# Patient Record
Sex: Male | Born: 1955
Health system: Southern US, Community
[De-identification: ages and names within clinical notes are randomized; demographics above are authoritative.]

## PROBLEM LIST (undated history)

## (undated) DIAGNOSIS — G4730 Sleep apnea, unspecified: Secondary | ICD-10-CM

## (undated) DIAGNOSIS — G5600 Carpal tunnel syndrome, unspecified upper limb: Secondary | ICD-10-CM

## (undated) DIAGNOSIS — R7302 Impaired glucose tolerance (oral): Secondary | ICD-10-CM

## (undated) DIAGNOSIS — N529 Male erectile dysfunction, unspecified: Secondary | ICD-10-CM

## (undated) DIAGNOSIS — C8330 Diffuse large B-cell lymphoma, unspecified site: Secondary | ICD-10-CM

## (undated) DIAGNOSIS — F1090 Alcohol use, unspecified, uncomplicated: Secondary | ICD-10-CM

## (undated) DIAGNOSIS — G8929 Other chronic pain: Secondary | ICD-10-CM

## (undated) DIAGNOSIS — I10 Essential (primary) hypertension: Secondary | ICD-10-CM

## (undated) DIAGNOSIS — Z789 Other specified health status: Secondary | ICD-10-CM

## (undated) DIAGNOSIS — M549 Dorsalgia, unspecified: Secondary | ICD-10-CM

## (undated) DIAGNOSIS — F419 Anxiety disorder, unspecified: Secondary | ICD-10-CM

## (undated) HISTORY — DX: Essential (primary) hypertension: I10

## (undated) HISTORY — DX: Other chronic pain: G89.29

## (undated) HISTORY — DX: Carpal tunnel syndrome, unspecified upper limb: G56.00

## (undated) HISTORY — PX: KNEE SURGERY: SHX244

## (undated) HISTORY — PX: COLONOSCOPY W/ POLYPECTOMY: SHX1380

## (undated) HISTORY — PX: WISDOM TOOTH EXTRACTION: SHX21

## (undated) HISTORY — DX: Dorsalgia, unspecified: M54.9

## (undated) HISTORY — DX: Diffuse large B-cell lymphoma, unspecified site: C83.30

## (undated) HISTORY — PX: VASECTOMY: SHX75

## (undated) HISTORY — DX: Impaired glucose tolerance (oral): R73.02

## (undated) HISTORY — DX: Anxiety disorder, unspecified: F41.9

## (undated) HISTORY — DX: Male erectile dysfunction, unspecified: N52.9

---

## 2004-10-03 ENCOUNTER — Ambulatory Visit (HOSPITAL_COMMUNITY): Admission: RE | Admit: 2004-10-03 | Discharge: 2004-10-03 | Payer: Self-pay | Admitting: General Surgery

## 2004-10-03 ENCOUNTER — Encounter (INDEPENDENT_AMBULATORY_CARE_PROVIDER_SITE_OTHER): Payer: Self-pay | Admitting: General Surgery

## 2006-10-24 ENCOUNTER — Ambulatory Visit: Payer: Self-pay | Admitting: Internal Medicine

## 2006-11-28 ENCOUNTER — Ambulatory Visit (HOSPITAL_COMMUNITY): Admission: RE | Admit: 2006-11-28 | Discharge: 2006-11-28 | Payer: Self-pay | Admitting: Internal Medicine

## 2006-11-28 ENCOUNTER — Encounter: Payer: Self-pay | Admitting: Internal Medicine

## 2006-11-28 ENCOUNTER — Ambulatory Visit: Payer: Self-pay | Admitting: Internal Medicine

## 2010-06-20 NOTE — Op Note (Signed)
NAMEJEN, Jared Bentley                  ACCOUNT NO.:  [id number]   MEDICAL RECORD NO.:  [id number]          PATIENT TYPE:  AMB   LOCATION:  DAY                           FACILITY:  [facility]   PHYSICIAN:  R. Roetta Sessions, M.D. DATE OF BIRTH:  1955-08-25   DATE OF PROCEDURE:  11/28/2006  DATE OF DISCHARGE:                                PROCEDURE NOTE   PROCEDURE:  Diagnostic colonoscopy with biopsy.   ENDOSCOPIST:  Jonathon Bellows, M.D.   INDICATIONS FOR PROCEDURE:  A 55 year old Caucasian male with  intermittent low-volume hematochezia for several years.  There is no  family history of colorectal neoplasia.  He has never his lower GI tract  evaluated.  Colonoscopy is now being done.  This approach has discussed  with the patient at length.  Potential risks, benefits and alternatives  have been reviewed and questions answered; he is agreeable.  please see  documentation in the medical record.   PROCEDURE NOTE:  O2 saturation, blood pressure, pulse and respirations  were monitored throughout the entire procedure.   CONSCIOUS SEDATION:  Versed 5 mg IV, Demerol 100 mg IV in divided doses.   INSTRUMENT:  Pentax video chip system.   FINDINGS:  Digital rectal exam revealed no abnormalities.   ENDOSCOPIC FINDINGS:  The prep was adequate.   COLON:  The colonic mucosa was surveyed from rectosigmoid junction  through the left, transverse and right colon at the area of the  appendiceal orifice, ileocecal valve and cecum.  These structures were  well seen and photographed for the record.  From this level, the scope  was slowly withdrawn and all previously mentioned mucosal surfaces were  again seen.  Careful examination of the fold of the colonic mucosa using  tip flexion and fold flattening was undertaken on the way out.  The  patient had a 4-mm polyp in the mid descending colon, which was cold-  biopsied/removed.  Remainder of the colonic mucosa appeared normal.  Scope was pulled down into  the rectum.  A thorough examination of the  rectal mucosa including a retroflex view of the anal verge and en face  view of the anal canal demonstrated only minimal hemorrhoid tissue and a  friable anal canal; otherwise, the rectal mucosa appeared entirely  normal.  The patient tolerated the procedure well and was reactive after  endoscopy.   IMPRESSION:  1. Friable anal canal, minimal hemorrhoids, otherwise normal rectum.  2. Diminutive mid descending colon polyp, cold-biopsied; remainder of      colonic mucosa appeared normal.   RECOMMENDATIONS:  1. Hemorrhoid literature provided to Mr. Downs.  2. Anusol-HC suppositories 1 per rectum at bedtime for 10 days.  3. Follow up on pathology.  4. Further recommendations to follow.      Jonathon Bellows, M.D.  Electronically Signed     RMR/MEDQ  D:  11/28/2006  T:  11/29/2006  Job:  [id number]   cc:   Lorin Picket A. Gerda Diss, MD  Fax: [phone]

## 2010-06-20 NOTE — Consult Note (Signed)
Jared Bentley, NEEDS                  ACCOUNT NO.:  [id number]   MEDICAL RECORD NO.:  [id number]         PATIENT TYPE:  AMB   LOCATION:  DAY                           FACILITY:  [facility]   PHYSICIAN:  R. Roetta Sessions, M.D. DATE OF BIRTH:  07-17-1955   DATE OF CONSULTATION:  DATE OF DISCHARGE:                                 CONSULTATION   REQUESTING PHYSICIAN:  Dr. Gerda Diss.   REASON FOR CONSULTATION:  Hematochezia.   HISTORY OF PRESENT ILLNESS:  Jared Bentley is a 55 year old Caucasian male.  He tells me he has had intermittent small-volume hematochezia over a  couple of years now.  He tells me initially it was related to what he  felt was hemorrhoidal disease and some constipation; however, about a  month ago, he had about 3 days where he had intermittent small volume  hematochezia.  He noticed it in the toilet water.  He had not been  constipated at that time.  He denies any history of diarrhea.  He  generally has a soft, brown bowel movement on a daily basis.  He did  notice some rectal pruritus, denies any proctalgia or abdominal pain.  He denies any heartburn, indigestion, dysphagia or odynophagia, anorexia  or early satiety.   PAST MEDICAL/SURGICAL HISTORY:  Hypertension, hemorrhoids, wisdom teeth  extraction.   CURRENT MEDICATIONS:  1. Vasotec 20 mg daily.  2. Phentermine once daily.   ALLERGIES:  NO KNOWN DRUG ALLERGIES.   FAMILY HISTORY:  No known family of colorectal carcinoma, lower chronic  GI problems.  His mother deceased at 42.  She had oral cancer.  Father,  age 49, deceased due to myeloma.  He has one healthy sister.   SOCIAL HISTORY:  Jared Bentley is married.  He has one healthy daughter.  He  is employed with [employer] in [place].  He has a 30-year history of  smoking about a pack and a half per day, quit 22 months ago.  He  consumes about 6 beers per day, denies any drug use.   REVIEW OF SYSTEMS:  See HPI.  CONSTITUTIONAL:  His weight is suddenly  increasing.  He  has gained 40 pounds since he quit smoking almost 2  years ago, otherwise negative.   PHYSICAL EXAMINATION:  VITAL SIGNS:  Weight 251 pounds, height 72  inches, temp 97.8, blood pressure 120/88 and pulse 64.  GENERAL:  Jared Bentley is an obese Caucasian male who is alert, oriented,  pleasant, cooperative, in no acute distress.  HEENT:  Sclerae clear, nonicteric.  Conjunctivae pink.  Oropharynx pink  and moist without any lesions.  NECK:  Supple without any masses or thyromegaly.  CHEST/HEART:  Regular rate and rhythm, normal S1, S2, without murmurs,  clicks, rubs or gallops.  LUNGS:  Clear to auscultation bilaterally.  ABDOMEN:  Protuberant with positive bowel sounds x4.  No bruits  auscultated.  Soft, nontender, nondistended without palpable or  hepatosplenomegaly.  No rebound tenderness or guarding.  Exam is  limited, given patient's body habitus.  EXTREMITIES:  Without clubbing or edema bilaterally.  RECTAL:  Deferred.  IMPRESSION:  Jared Bentley is a 55 year old Caucasian male with intermittent  hematochezia.  He is going to need further evaluation, given his age, to  rule out colorectal carcinoma, although his bleeding could be related to  benign anorectal source.  Less likely would be diverticular bleeding.   PLAN:  Colonoscopy with Dr. Jena Gauss in the near future was discussing.  Procedure, risks and benefits including but not limited to bleeding,  infection, perforation, drug reaction, increased __________ .  Consent  will be obtained.   I would like to thank Dr. Gerda Diss for allowing [place] to participate in the  care of Jared Bentley.      Lorenza Burton, N.P.      Jonathon Bellows, M.D.  Electronically Signed    KJ/MEDQ  D:  10/24/2006  T:  10/24/2006  Job:  [id number]   cc:   Lorin Picket A. Gerda Diss, MD  Fax: [phone]

## 2012-05-14 ENCOUNTER — Ambulatory Visit (INDEPENDENT_AMBULATORY_CARE_PROVIDER_SITE_OTHER): Payer: BC Managed Care – PPO | Admitting: Family Medicine

## 2012-05-14 ENCOUNTER — Encounter: Payer: Self-pay | Admitting: Family Medicine

## 2012-05-14 VITALS — BP 124/90 | HR 70 | Ht 72.0 in | Wt 260.6 lb

## 2012-05-14 DIAGNOSIS — Z0000 Encounter for general adult medical examination without abnormal findings: Secondary | ICD-10-CM

## 2012-05-14 DIAGNOSIS — I10 Essential (primary) hypertension: Secondary | ICD-10-CM

## 2012-05-14 DIAGNOSIS — Z125 Encounter for screening for malignant neoplasm of prostate: Secondary | ICD-10-CM

## 2012-05-14 MED ORDER — ENALAPRIL MALEATE 10 MG PO TABS: 10.0000 mg | ORAL_TABLET | Freq: Every day | ORAL | Status: DC

## 2012-05-14 NOTE — Progress Notes (Signed)
  Subjective:    Patient ID: Jared Bentley, male    DOB: 1955-10-16, 57 y.o.   MRN: [id number]  HPI  Patient arrives for yearly physical. States not exercising these days. He is walking some. But not much. Diet is fair. Claims compliance with his medications. Last colonoscopy 2008 doing 10 years. No headache or chest pain. Decent appetite.  Review of Systems  Constitutional: Negative for fever, activity change and appetite change.  HENT: Negative for congestion, rhinorrhea and neck pain.   Eyes: Negative for discharge.  Respiratory: Negative for cough and wheezing.   Cardiovascular: Negative for chest pain.  Gastrointestinal: Negative for vomiting, abdominal pain and blood in stool.  Genitourinary: Negative for frequency and difficulty urinating.  Skin: Negative for rash.  Allergic/Immunologic: Negative for environmental allergies and food allergies.  Neurological: Negative for weakness and headaches.  Psychiatric/Behavioral: Negative for agitation.       Objective:   Physical Exam  Vitals reviewed. Constitutional: He is oriented to person, place, and time. He appears well-developed and well-nourished.  HENT:  Head: Normocephalic and atraumatic.  Eyes: Conjunctivae are normal. Pupils are equal, round, and reactive to light.  Neck: Normal range of motion. Neck supple. No thyromegaly present.  Cardiovascular: Normal rate, regular rhythm and normal heart sounds.   Pulmonary/Chest: Effort normal and breath sounds normal.  Abdominal: Soft. Bowel sounds are normal.  Genitourinary: Prostate normal and penis normal.  Musculoskeletal: Normal range of motion.  Neurological: He is alert and oriented to person, place, and time.  Skin: Skin is warm and dry.          Assessment & Plan:  Impression #1 wellness exam. #2 hypertension good control. #3 erectile dysfunction intermittent. Plan appropriate blood work. Diet exercise discussed in encourage. Hemoccult cards. WSL

## 2012-05-14 NOTE — Progress Notes (Signed)
Hemoccult cards given and explained to pt.

## 2012-05-19 ENCOUNTER — Telehealth: Payer: Self-pay | Admitting: Family Medicine

## 2012-05-19 ENCOUNTER — Other Ambulatory Visit: Payer: Self-pay | Admitting: *Deleted

## 2012-05-19 MED ORDER — ENALAPRIL MALEATE 20 MG PO TABS: 20.0000 mg | ORAL_TABLET | Freq: Every day | ORAL | Status: DC

## 2012-05-19 MED ORDER — ENALAPRIL MALEATE 10 MG PO TABS: 20.0000 mg | ORAL_TABLET | Freq: Every day | ORAL | Status: DC

## 2012-05-19 NOTE — Telephone Encounter (Signed)
Patient says that he wasn't aware the dr. Was changing his Enalapril medication dosage. He was taking 20 mg, but when he got to the pharmacy it was changed to 10 mg. Please advise.

## 2012-05-19 NOTE — Telephone Encounter (Signed)
[facility]april 10 mg [facility] at [facility]. [facility]ap[facility]  [facility]. Pt notified dose was not changed. Error in sending in script.

## 2012-06-19 ENCOUNTER — Encounter: Payer: Self-pay | Admitting: Family Medicine

## 2012-12-15 ENCOUNTER — Other Ambulatory Visit: Payer: Self-pay | Admitting: Family Medicine

## 2012-12-23 ENCOUNTER — Encounter: Payer: Self-pay | Admitting: Family Medicine

## 2012-12-23 ENCOUNTER — Ambulatory Visit (INDEPENDENT_AMBULATORY_CARE_PROVIDER_SITE_OTHER): Payer: BC Managed Care – PPO | Admitting: Family Medicine

## 2012-12-23 VITALS — BP 124/88 | Ht 72.0 in | Wt 264.8 lb

## 2012-12-23 DIAGNOSIS — I10 Essential (primary) hypertension: Secondary | ICD-10-CM

## 2012-12-23 DIAGNOSIS — N529 Male erectile dysfunction, unspecified: Secondary | ICD-10-CM

## 2012-12-23 NOTE — Progress Notes (Signed)
  Subjective:    Patient ID: Jared Bentley, male    DOB: 09-21-1955, 57 y.o.   MRN: [id number]  HPI Patient arrives to follow up on blood pressure. No problems or concerns.  Has cut down the salt and trying to exercise  Anxiety is stable  Not missing meds Watch diet. Now he is retired. Still quite busy with all of his duties.  Review of Systems No chest pain at no headache no abdominal pain no shortness of breath ROS otherwise negative    Objective:   Physical Exam  Blood pressure good on repeat. Alert lungs clear. Heart regular rate and rhythm. HEENT normal.      Assessment & Plan:  Impression hypertension good control. #2 rectal dysfunction discussed. Plan maintain same meds. Diet exercise discussed. Recheck in 6 months.

## 2012-12-28 DIAGNOSIS — I10 Essential (primary) hypertension: Secondary | ICD-10-CM | POA: Insufficient documentation

## 2012-12-28 DIAGNOSIS — N529 Male erectile dysfunction, unspecified: Secondary | ICD-10-CM | POA: Insufficient documentation

## 2013-03-25 ENCOUNTER — Other Ambulatory Visit: Payer: Self-pay | Admitting: Family Medicine

## 2013-03-25 ENCOUNTER — Telehealth: Payer: Self-pay | Admitting: Family Medicine

## 2013-03-25 NOTE — Telephone Encounter (Signed)
error 

## 2013-05-25 ENCOUNTER — Telehealth: Payer: Self-pay | Admitting: *Deleted

## 2013-05-25 NOTE — Telephone Encounter (Signed)
Please review sleep study report.

## 2013-05-26 NOTE — Telephone Encounter (Signed)
Report reviewed. They appear to be managing it, will scan

## 2013-06-30 ENCOUNTER — Ambulatory Visit (INDEPENDENT_AMBULATORY_CARE_PROVIDER_SITE_OTHER): Payer: BC Managed Care – PPO | Admitting: Family Medicine

## 2013-06-30 ENCOUNTER — Encounter: Payer: Self-pay | Admitting: Family Medicine

## 2013-06-30 VITALS — BP 102/80 | Ht 72.0 in | Wt 267.2 lb

## 2013-06-30 DIAGNOSIS — Z0189 Encounter for other specified special examinations: Secondary | ICD-10-CM

## 2013-06-30 DIAGNOSIS — Z79899 Other long term (current) drug therapy: Secondary | ICD-10-CM

## 2013-06-30 DIAGNOSIS — Z125 Encounter for screening for malignant neoplasm of prostate: Secondary | ICD-10-CM

## 2013-06-30 DIAGNOSIS — G4733 Obstructive sleep apnea (adult) (pediatric): Secondary | ICD-10-CM | POA: Insufficient documentation

## 2013-06-30 DIAGNOSIS — Z23 Encounter for immunization: Secondary | ICD-10-CM

## 2013-06-30 DIAGNOSIS — R748 Abnormal levels of other serum enzymes: Secondary | ICD-10-CM

## 2013-06-30 DIAGNOSIS — I10 Essential (primary) hypertension: Secondary | ICD-10-CM

## 2013-06-30 LAB — LIPID PANEL
CHOLESTEROL: 198 mg/dL (ref 0–200)
HDL: 63 mg/dL (ref >39)
LDL CALC: 121 mg/dL — AB (ref 0–99)
Total CHOL/HDL Ratio: 3.1 Ratio
Triglycerides: 69 mg/dL (ref <150)
VLDL: 14 mg/dL (ref 0–40)

## 2013-06-30 LAB — BASIC METABOLIC PANEL
BUN: 15 mg/dL (ref 6–23)
CHLORIDE: 100 meq/L (ref 96–112)
CO2: 28 mEq/L (ref 19–32)
Calcium: 9.8 mg/dL (ref 8.4–10.5)
Creat: 1.02 mg/dL (ref 0.50–1.35)
GLUCOSE: 106 mg/dL — AB (ref 70–99)
POTASSIUM: 4.7 meq/L (ref 3.5–5.3)
Sodium: 135 mEq/L (ref 135–145)

## 2013-06-30 LAB — HEPATIC FUNCTION PANEL
ALT: 113 U/L — ABNORMAL HIGH (ref 0–53)
AST: 63 U/L — ABNORMAL HIGH (ref 0–37)
Albumin: 4.5 g/dL (ref 3.5–5.2)
Alkaline Phosphatase: 46 U/L (ref 39–117)
BILIRUBIN DIRECT: 0.2 mg/dL (ref 0.0–0.3)
BILIRUBIN INDIRECT: 0.5 mg/dL (ref 0.2–1.2)
BILIRUBIN TOTAL: 0.7 mg/dL (ref 0.2–1.2)
Total Protein: 6.7 g/dL (ref 6.0–8.3)

## 2013-06-30 NOTE — Progress Notes (Signed)
   Subjective:    Patient ID: Jared Bentley, male    DOB: Jun 13, 1955, 58 y.o.   MRN: [id number]  HPI Patient is here today for is annual wellness exam. Patient states that he wants the doctor to take a look at the spots on his arms and make sure it doesn't look like skin cancer. Slight itching is noted on his arms. Patient has no other concerns at this time.   Walks daily a mile, Regular activity  Working in a garden this yr  Walking a lot  Had a sleep study done, now using a CPAP device , seems to be really helping  Next colon due 2018   Review of Systems  Constitutional: Negative for fever, activity change and appetite change.  HENT: Negative for congestion and rhinorrhea.   Eyes: Negative for discharge.  Respiratory: Negative for cough and wheezing.   Cardiovascular: Negative for chest pain.  Gastrointestinal: Negative for vomiting, abdominal pain and blood in stool.  Genitourinary: Negative for frequency and difficulty urinating.  Musculoskeletal: Negative for neck pain.  Skin: Negative for rash.  Allergic/Immunologic: Negative for environmental allergies and food allergies.  Neurological: Negative for weakness and headaches.  Psychiatric/Behavioral: Negative for agitation.  All other systems reviewed and are negative.      Objective:   Physical Exam  Vitals reviewed. Constitutional: He appears well-developed and well-nourished.  HENT:  Head: Normocephalic and atraumatic.  Right Ear: External ear normal.  Left Ear: External ear normal.  Nose: Nose normal.  Mouth/Throat: Oropharynx is clear and moist.  Eyes: EOM are normal. Pupils are equal, round, and reactive to light.  Neck: Normal range of motion. Neck supple. No thyromegaly present.  Cardiovascular: Normal rate, regular rhythm and normal heart sounds.   No murmur heard. Pulmonary/Chest: Effort normal and breath sounds normal. No respiratory distress. He has no wheezes.  Abdominal: Soft. Bowel sounds are  normal. He exhibits no distension and no mass. There is no tenderness.  Genitourinary: Penis normal.  Musculoskeletal: Normal range of motion. He exhibits no edema.  Lymphadenopathy:    He has no cervical adenopathy.  Neurological: He is alert. He exhibits normal muscle tone.  Skin: Skin is warm and dry. No erythema.  Psychiatric: He has a normal mood and affect. His behavior is normal. Judgment normal.          Assessment & Plan:  #1 wellness exam. #2 colonoscopy screening next 02-2016. #3 vaccines discussed. #4 hypertension good control. Plan diet and exercise discussed at length. Maintain medications. Tetanus shot today. Hemoccult cards. Check every 6 months. Encouraged to lose some weight. Appropriate blood work. Meds refilled. WSL

## 2013-07-01 LAB — PSA: PSA: 0.75 ng/mL (ref <=4.00)

## 2013-07-13 ENCOUNTER — Other Ambulatory Visit: Payer: Self-pay | Admitting: *Deleted

## 2013-07-13 DIAGNOSIS — Z0189 Encounter for other specified special examinations: Secondary | ICD-10-CM

## 2013-07-13 LAB — POC HEMOCCULT BLD/STL (HOME/3-CARD/SCREEN)
Card #3 Fecal Occult Blood, POC: NEGATIVE
FECAL OCCULT BLD: NEGATIVE
Fecal Occult Blood, POC: NEGATIVE

## 2013-07-13 NOTE — Addendum Note (Signed)
Addended by: Carmelina Noun on: 07/13/2013 11:42 AM   Modules accepted: Orders

## 2013-09-17 ENCOUNTER — Other Ambulatory Visit: Payer: Self-pay | Admitting: Family Medicine

## 2013-09-30 ENCOUNTER — Telehealth: Payer: Self-pay | Admitting: Family Medicine

## 2013-09-30 NOTE — Telephone Encounter (Signed)
Patient said that he received blood work orders in the mail. He said when he was here last, he was supposed to get his liver enzymes checked. He wants to know if he is supposed to go ahead and do this BW or wait until closer to the time of him coming in in November?

## 2013-09-30 NOTE — Telephone Encounter (Signed)
Notified patient Dr. Richardson Landry wanted bloodwork done 3 months from June. Patient verbalized understanding.

## 2013-10-06 LAB — HEPATIC FUNCTION PANEL
ALK PHOS: 44 U/L (ref 39–117)
ALT: 128 U/L — ABNORMAL HIGH (ref 0–53)
AST: 73 U/L — ABNORMAL HIGH (ref 0–37)
Albumin: 4.1 g/dL (ref 3.5–5.2)
BILIRUBIN DIRECT: 0.2 mg/dL (ref 0.0–0.3)
BILIRUBIN INDIRECT: 0.7 mg/dL (ref 0.2–1.2)
Total Bilirubin: 0.9 mg/dL (ref 0.2–1.2)
Total Protein: 6.6 g/dL (ref 6.0–8.3)

## 2013-10-21 ENCOUNTER — Ambulatory Visit (INDEPENDENT_AMBULATORY_CARE_PROVIDER_SITE_OTHER): Payer: BC Managed Care – PPO | Admitting: Family Medicine

## 2013-10-21 ENCOUNTER — Encounter: Payer: Self-pay | Admitting: Family Medicine

## 2013-10-21 VITALS — BP 114/84 | Ht 72.0 in | Wt 269.0 lb

## 2013-10-21 DIAGNOSIS — R748 Abnormal levels of other serum enzymes: Secondary | ICD-10-CM

## 2013-10-21 NOTE — Progress Notes (Signed)
   Subjective:    Patient ID: Jared Bentley, male    DOB: 10-01-1955, 58 y.o.   MRN: [id number]  HPI Patient here today for elevated lab results. Patient has no other concerns.  Patient arrives for an extensive discussion regarding his blood work.  For the past 2 evaluations in 3 months the patient's liver enzymes have been elevated. Next   n known history o of jaundice or hepatitis or abdominal pain. Next  Patient admits to increased alcohol intake since retirement. Have routine 12 beers per day. Not exercising. Continues to gain substantial weight.  Results for orders placed in visit on 07/13/13  POC HEMOCCULT BLD/STL (HOME/3-CARD/SCREEN)      Result Value Ref Range   Card #1 Date       Fecal Occult Blood, POC Negative     Card #2 Date       Card #2 Fecal Occult Blod, POC Negative     Card #3 Date       Card #3 Fecal Occult Blood, POC Negative      Review of Systems No headache no chest pain no abdominal pain no change in bowel habits no rash no blood in stool ROS otherwise negative    Objective:   Physical Exam Alert no apparent distress. Lungs clear. Heart regular in rhythm. HEENT normal. Ankles edema. Abdomen benign.       Assessment & Plan:  Impression elevated liver enzymes discussed at great length. Likely fatty liver with obesity but alcohol may well be contributing. Plan appropriate blood work drawn hepatitis hemachromatosis etc. Right upper quadrant ultrasound we will fill minimal results. Trying hard to lose weight. Cut alcohol intake at least in half. Followup as originally scheduled in several months. WSL

## 2013-10-24 LAB — IRON AND TIBC
%SAT: 34 % (ref 20–55)
Iron: 105 ug/dL (ref 42–165)
TIBC: 312 ug/dL (ref 215–435)
UIBC: 207 ug/dL (ref 125–400)

## 2013-10-24 LAB — FERRITIN: FERRITIN: 592 ng/mL — AB (ref 22–322)

## 2013-10-24 LAB — HEPATITIS B SURFACE ANTIGEN: HEP B S AG: NEGATIVE

## 2013-10-24 LAB — HEPATITIS C ANTIBODY: HCV Ab: NEGATIVE

## 2013-10-26 ENCOUNTER — Ambulatory Visit (HOSPITAL_COMMUNITY)
Admission: RE | Admit: 2013-10-26 | Discharge: 2013-10-26 | Disposition: A | Discharge status: Home / Self Care | Payer: BC Managed Care – PPO | Source: Ambulatory Visit | Attending: Family Medicine | Admitting: Family Medicine

## 2013-10-26 DIAGNOSIS — R7989 Other specified abnormal findings of blood chemistry: Secondary | ICD-10-CM | POA: Insufficient documentation

## 2013-10-26 DIAGNOSIS — K769 Liver disease, unspecified: Secondary | ICD-10-CM | POA: Insufficient documentation

## 2013-12-09 ENCOUNTER — Encounter: Payer: Self-pay | Admitting: Family Medicine

## 2013-12-09 ENCOUNTER — Ambulatory Visit (INDEPENDENT_AMBULATORY_CARE_PROVIDER_SITE_OTHER): Payer: BC Managed Care – PPO | Admitting: Family Medicine

## 2013-12-09 VITALS — BP 112/76 | Temp 98.4°F | Ht 72.0 in | Wt 270.0 lb

## 2013-12-09 DIAGNOSIS — R1900 Intra-abdominal and pelvic swelling, mass and lump, unspecified site: Secondary | ICD-10-CM

## 2013-12-09 DIAGNOSIS — I10 Essential (primary) hypertension: Secondary | ICD-10-CM

## 2013-12-09 NOTE — Progress Notes (Signed)
   Subjective:    Patient ID: Jared Bentley, male    DOB: [birth year], 58 y.o.   MRN: [id number]  HPI Knot underneath the skin of his abdomen on the left side.  He first noticed it 2 months ago.  It has gotten bigger.  He said it causes mild pain with activity.   Worse with pressure  Now sensitive and sore   no change in bowel habits  Patient first noted discomfort a couple of months ago. No dysuria no increased frequency no hematuria  Review of Systems    no abdominal pain no chest pain no weight loss Objective:   Physical Exam  Alert no apparent distress vital stable. Lungs clear. Heart regular rate and rhythm. Axillary region no adenopathy left groin inguinal mass palpated. May be fat pad but appears to be true mass. Appears to high for typical hernia abdominal exam benign       Assessment & Plan:  Impression left groin mass maybe lymph node, may be high hernia, may be deep fat pad plan scan recommended. Rationale discussed. Further recommendations based on results. WSL

## 2013-12-12 LAB — BASIC METABOLIC PANEL
BUN: 12 mg/dL (ref 6–23)
CALCIUM: 9.6 mg/dL (ref 8.4–10.5)
CHLORIDE: 103 meq/L (ref 96–112)
CO2: 25 mEq/L (ref 19–32)
CREATININE: 0.98 mg/dL (ref 0.50–1.35)
Glucose, Bld: 90 mg/dL (ref 70–99)
Potassium: 4.6 mEq/L (ref 3.5–5.3)
Sodium: 140 mEq/L (ref 135–145)

## 2013-12-14 ENCOUNTER — Encounter (HOSPITAL_COMMUNITY): Payer: Self-pay

## 2013-12-14 ENCOUNTER — Ambulatory Visit (HOSPITAL_COMMUNITY)
Admission: RE | Admit: 2013-12-14 | Discharge: 2013-12-14 | Disposition: A | Discharge status: Home / Self Care | Payer: BC Managed Care – PPO | Source: Ambulatory Visit | Attending: Family Medicine | Admitting: Family Medicine

## 2013-12-14 ENCOUNTER — Encounter: Payer: Self-pay | Admitting: Family Medicine

## 2013-12-14 ENCOUNTER — Ambulatory Visit (INDEPENDENT_AMBULATORY_CARE_PROVIDER_SITE_OTHER): Payer: BC Managed Care – PPO | Admitting: Family Medicine

## 2013-12-14 VITALS — BP 110/68 | Ht 72.0 in | Wt 274.0 lb

## 2013-12-14 DIAGNOSIS — K760 Fatty (change of) liver, not elsewhere classified: Secondary | ICD-10-CM | POA: Insufficient documentation

## 2013-12-14 DIAGNOSIS — R590 Localized enlarged lymph nodes: Secondary | ICD-10-CM | POA: Diagnosis not present

## 2013-12-14 DIAGNOSIS — R1904 Left lower quadrant abdominal swelling, mass and lump: Secondary | ICD-10-CM | POA: Diagnosis present

## 2013-12-14 DIAGNOSIS — R1900 Intra-abdominal and pelvic swelling, mass and lump, unspecified site: Secondary | ICD-10-CM

## 2013-12-14 DIAGNOSIS — R1909 Other intra-abdominal and pelvic swelling, mass and lump: Secondary | ICD-10-CM

## 2013-12-14 MED ORDER — IOHEXOL 300 MG/ML  SOLN
100.0000 mL | Freq: Once | INTRAMUSCULAR | Status: AC | PRN
  Administered 2013-12-14: 100 mL via INTRAVENOUS

## 2013-12-14 NOTE — Progress Notes (Signed)
   Subjective:    Patient ID: Jared Bentley, male    DOB: December 10, 1955, 58 y.o.   MRN: [id number]  HPI  Cell.  [id number]  [id number]  neepatient arrives office for an extensive discussion regarding his scan. See prior notes. Present with left inguinal mass. We went on and did CT of abdomen and pelvis. Review of Systems No abdominal pain no chest pain no headache no weight loss no change in bowel habits    Objective:   Physical Exam Alert no apparent distress. Lungs clear. Heart rare rhythm. H&T normal. Abdomen benign. Left groin mass noted.       Assessment & Plan:  Impression adenopathy. Unfortunately scan revealed significant left iliac intrapelvic adenopathy along with left inguinal canal adenopathy. Radiologist felt most likely diagnosis is lymphoma. Discussed with patient. Plan urgent surgery referral for excisional biopsy/oncology referral presuming positive results. Will need to see after surgery performed. WSL

## 2013-12-14 NOTE — Progress Notes (Signed)
Patient notified and I transferred him up front to see Dr. Richardson Landry this afternoon.

## 2013-12-21 ENCOUNTER — Other Ambulatory Visit (INDEPENDENT_AMBULATORY_CARE_PROVIDER_SITE_OTHER): Payer: Self-pay | Admitting: General Surgery

## 2013-12-21 ENCOUNTER — Encounter (HOSPITAL_BASED_OUTPATIENT_CLINIC_OR_DEPARTMENT_OTHER): Payer: Self-pay | Admitting: *Deleted

## 2013-12-21 NOTE — Progress Notes (Signed)
Pt heavy alcohol drinker-asked to cut down-labs done 12/09/13 Will need ekg-has been 10 hr since he had one Bring cpap and knows to use post op

## 2013-12-21 NOTE — H&P (Signed)
Jared Bentley 12/21/2013 11:02 AM Location: [facility] Patient #: [id number] DOB: 09-Jan-1956 Married / Language: Jared Bentley / Race: White Male  History of Present Illness Jared Klein MD; 12/21/2013 11:50 AM) Patient words: groin mass.  The patient is a 58 year old male who presents with lymphadenopathy. Pt is referred by Dr. Wolfgang Bentley for consultation regarding an enlarged left inguinal node. He noticed this around 2 month(s) ago. Symptoms include palpable lump and visible lump, while symptoms do not include fever or weight loss. The lymphadenopathy is located in the left inguinal region. The nodes are described as firm. The patient describes this as growing larger. Associated symptoms do not include abdominal mass, neck mass, scrotal swelling, lethargy, night sweats or edema. Pertinent medical history does not include cellulitis, leukemia, lymphoma or intra-abdominal neoplasm. Dr. Wolfgang Bentley ordered a CT abd/pelvis on him and he has LAD all along the iliac chain.  He has a family history of leukemia in his father and tongue cancer in his mother.    Other Problems Jared Bentley, Bentley; 12/21/2013 11:03 AM) High blood pressure Sleep Apnea  Past Surgical History Jared Bentley; 12/21/2013 11:03 AM) Oral Surgery Vasectomy  Diagnostic Studies History Jared Bentley; 12/21/2013 11:03 AM) Colonoscopy 5-10 years ago  Allergies Jared Bentley, Bentley; 12/21/2013 11:03 AM) No Known Drug Allergies11/16/2015  Medication History Jared Bentley; 12/21/2013 11:03 AM) Enalapril Maleate (20MG  Tablet, Oral) Active.  Social History Jared Bentley; 12/21/2013 11:03 AM) Alcohol use Heavy alcohol use. Caffeine use Carbonated beverages. No drug use Tobacco use Former smoker.  Family History Jared Bentley; 12/21/2013 11:03 AM) Cancer Father, Mother. Thyroid problems Mother.  Review of Systems (Jared Bentley; 12/21/2013  11:03 AM) General Not Present- Appetite Loss, Chills, Fatigue, Fever, Night Sweats, Weight Gain and Weight Loss. Skin Not Present- Change in Wart/Mole, Dryness, Hives, Jaundice, New Lesions, Non-Healing Wounds, Rash and Ulcer. HEENT Not Present- Earache, Hearing Loss, Hoarseness, Nose Bleed, Oral Ulcers, Ringing in the Ears, Seasonal Allergies, Sinus Pain, Sore Throat, Visual Disturbances, Wears glasses/contact lenses and Yellow Eyes. Respiratory Present- Snoring. Not Present- Bloody sputum, Chronic Cough, Difficulty Breathing and Wheezing. Cardiovascular Not Present- Chest Pain, Difficulty Breathing Lying Down, Leg Cramps, Palpitations, Rapid Heart Rate, Shortness of Breath and Swelling of Extremities. Gastrointestinal Not Present- Abdominal Pain, Bloating, Bloody Stool, Change in Bowel Habits, Chronic diarrhea, Constipation, Difficulty Swallowing, Excessive gas, Gets full quickly at meals, Hemorrhoids, Indigestion, Nausea, Rectal Pain and Vomiting. Male Genitourinary Not Present- Blood in Urine, Change in Urinary Stream, Frequency, Impotence, Nocturia, Painful Urination, Urgency and Urine Leakage. Musculoskeletal Not Present- Back Pain, Joint Pain, Joint Stiffness, Muscle Pain, Muscle Weakness and Swelling of Extremities. Neurological Not Present- Decreased Memory, Fainting, Headaches, Numbness, Seizures, Tingling, Tremor, Trouble walking and Weakness. Psychiatric Not Present- Anxiety, Bipolar, Change in Sleep Pattern, Depression, Fearful and Frequent crying. Endocrine Not Present- Cold Intolerance, Excessive Hunger, Hair Changes, Heat Intolerance, Hot flashes and New Diabetes. Hematology Not Present- Easy Bruising, Excessive bleeding, Gland problems, HIV and Persistent Infections.   Vitals Jared Bentley; 12/21/2013 11:03 AM) 12/21/2013 11:02 AM Weight: 271.13 lb Height: 72in Body Surface Area: 2.5 m Body Mass Index: 36.77 kg/m Pulse: 74 (Regular)  Resp.: 14 (Unlabored)   BP: 120/80 (Sitting, Left Arm, Standard)    Physical Exam Jared Klein MD; 12/21/2013 11:53 AM) General Mental Status-Alert. General Appearance-Consistent with stated age. Hydration-Well hydrated. Voice-Normal.  Head and Neck Head-normocephalic, atraumatic with no lesions or palpable masses. Trachea-midline.  Thyroid Gland Characteristics - normal size and consistency.  Eye Eyeball - Bilateral-Extraocular movements intact. Sclera/Conjunctiva - Bilateral-No scleral icterus.  Chest and Lung Exam Chest and lung exam reveals -quiet, even and easy respiratory effort with no use of accessory muscles and on auscultation, normal breath sounds, no adventitious sounds and normal vocal resonance. Inspection Chest Wall - Normal. Back - normal.  Cardiovascular Cardiovascular examination reveals -normal heart sounds, regular rate and rhythm with no murmurs and normal pedal pulses bilaterally.  Abdomen Inspection Inspection of the abdomen reveals - No Hernias. Palpation/Percussion Palpation and Percussion of the abdomen reveal - Soft, Non Tender, No Rebound tenderness, No Rigidity (guarding) and No hepatosplenomegaly. Auscultation Auscultation of the abdomen reveals - Bowel sounds normal.  Neurologic Neurologic evaluation reveals -alert and oriented x 3 with no impairment of recent or remote memory. Mental Status-Normal.  Musculoskeletal Global Assessment -Note: no gross deformities.  Normal Exam - Left-Upper Extremity Strength Normal and Lower Extremity Strength Normal. Normal Exam - Right-Upper Extremity Strength Normal and Lower Extremity Strength Normal.  Lymphatic Head & Neck  General Head & Neck Lymphatics: Bilateral - Description - Normal. Axillary  General Axillary Region: Bilateral - Description - Normal. Tenderness - Non Tender. Femoral & Inguinal  Generalized Femoral & Inguinal Lymphatics: Bilateral - Description - No Generalized  lymphadenopathy. Inguinal nodes: Left - Size - 2.5 cm. Consistency - Firm. Mobility - Mobile. Shape - Matted nodes. Overlying skin - Normal. Tenderness - Non Tender.    Assessment & Plan Jared Klein MD; 12/21/2013 11:55 AM) INGUINAL ADENOPATHY (785.6  R59.9) Impression: Plan LN biopsy.  Reviewed risks of surgery including bleeding, infection, pain, swelling, possible need for additional procedures.  Even if this is not lymphoma, it appears to be some type of malignancy. I will defer further staging to oncology, as the studies they may order might vary according to type of malignancy. Current Plans  Referred to Oncology, for evaluation and follow up (Oncology). Instructions:   Signed by Jared Klein, MD (12/21/2013 11:55 AM)

## 2013-12-22 ENCOUNTER — Encounter (HOSPITAL_BASED_OUTPATIENT_CLINIC_OR_DEPARTMENT_OTHER)
Admission: RE | Disposition: A | Discharge status: Home / Self Care | Payer: Self-pay | Source: Ambulatory Visit | Attending: General Surgery

## 2013-12-22 ENCOUNTER — Ambulatory Visit (HOSPITAL_BASED_OUTPATIENT_CLINIC_OR_DEPARTMENT_OTHER)
Admission: RE | Admit: 2013-12-22 | Discharge: 2013-12-22 | Disposition: A | Discharge status: Home / Self Care | Payer: BC Managed Care – PPO | Source: Ambulatory Visit | Attending: General Surgery | Admitting: General Surgery

## 2013-12-22 ENCOUNTER — Ambulatory Visit (HOSPITAL_BASED_OUTPATIENT_CLINIC_OR_DEPARTMENT_OTHER): Payer: BC Managed Care – PPO | Admitting: Certified Registered"

## 2013-12-22 ENCOUNTER — Encounter (HOSPITAL_BASED_OUTPATIENT_CLINIC_OR_DEPARTMENT_OTHER): Payer: Self-pay | Admitting: *Deleted

## 2013-12-22 DIAGNOSIS — Z87891 Personal history of nicotine dependence: Secondary | ICD-10-CM | POA: Insufficient documentation

## 2013-12-22 DIAGNOSIS — C8235 Follicular lymphoma grade IIIa, lymph nodes of inguinal region and lower limb: Secondary | ICD-10-CM | POA: Insufficient documentation

## 2013-12-22 DIAGNOSIS — I10 Essential (primary) hypertension: Secondary | ICD-10-CM | POA: Diagnosis not present

## 2013-12-22 DIAGNOSIS — R590 Localized enlarged lymph nodes: Secondary | ICD-10-CM | POA: Diagnosis present

## 2013-12-22 DIAGNOSIS — Z6836 Body mass index (BMI) 36.0-36.9, adult: Secondary | ICD-10-CM | POA: Insufficient documentation

## 2013-12-22 HISTORY — DX: Other specified health status: Z78.9

## 2013-12-22 HISTORY — DX: Alcohol use, unspecified, uncomplicated: F10.90

## 2013-12-22 HISTORY — PX: LYMPH NODE BIOPSY: SHX201

## 2013-12-22 HISTORY — DX: Sleep apnea, unspecified: G47.30

## 2013-12-22 LAB — POCT HEMOGLOBIN-HEMACUE: Hemoglobin: 16 g/dL (ref 13.0–17.0)

## 2013-12-22 SURGERY — LYMPH NODE BIOPSY
Anesthesia: General | Site: Groin | Laterality: Left

## 2013-12-22 MED ORDER — PROPOFOL 10 MG/ML IV EMUL
INTRAVENOUS | Status: AC
  Filled 2013-12-22: qty 50

## 2013-12-22 MED ORDER — OXYCODONE HCL 5 MG PO TABS
5.0000 mg | ORAL_TABLET | Freq: Once | ORAL | Status: AC | PRN
  Administered 2013-12-22: 5 mg via ORAL

## 2013-12-22 MED ORDER — BUPIVACAINE HCL (PF) 0.25 % IJ SOLN
INTRAMUSCULAR | Status: AC
  Filled 2013-12-22: qty 30

## 2013-12-22 MED ORDER — DEXTROSE 5 % IV SOLN
3.0000 g | INTRAVENOUS | Status: AC
  Administered 2013-12-22: 3 g via INTRAVENOUS

## 2013-12-22 MED ORDER — DEXAMETHASONE SODIUM PHOSPHATE 4 MG/ML IJ SOLN
INTRAMUSCULAR | Status: DC | PRN
  Administered 2013-12-22: 10 mg via INTRAVENOUS

## 2013-12-22 MED ORDER — HYDROMORPHONE HCL 1 MG/ML IJ SOLN
INTRAMUSCULAR | Status: AC
  Filled 2013-12-22: qty 1

## 2013-12-22 MED ORDER — MIDAZOLAM HCL 5 MG/5ML IJ SOLN
INTRAMUSCULAR | Status: DC | PRN
  Administered 2013-12-22: 2 mg via INTRAVENOUS

## 2013-12-22 MED ORDER — SODIUM CHLORIDE 0.9 % IV SOLN: 250.0000 mL | INTRAVENOUS | Status: DC | PRN

## 2013-12-22 MED ORDER — ACETAMINOPHEN 325 MG PO TABS: 650.0000 mg | ORAL_TABLET | ORAL | Status: DC | PRN

## 2013-12-22 MED ORDER — FENTANYL CITRATE 0.05 MG/ML IJ SOLN
INTRAMUSCULAR | Status: AC
  Filled 2013-12-22: qty 6

## 2013-12-22 MED ORDER — OXYCODONE HCL 5 MG PO TABS
ORAL_TABLET | ORAL | Status: AC
  Filled 2013-12-22: qty 1

## 2013-12-22 MED ORDER — LIDOCAINE HCL (CARDIAC) 20 MG/ML IV SOLN
INTRAVENOUS | Status: DC | PRN
  Administered 2013-12-22: 100 mg via INTRAVENOUS

## 2013-12-22 MED ORDER — OXYCODONE-ACETAMINOPHEN 5-325 MG PO TABS: 1.0000 | ORAL_TABLET | ORAL | Status: DC | PRN

## 2013-12-22 MED ORDER — SODIUM CHLORIDE 0.9 % IJ SOLN: 3.0000 mL | Freq: Two times a day (BID) | INTRAMUSCULAR | Status: DC

## 2013-12-22 MED ORDER — CEFAZOLIN SODIUM-DEXTROSE 2-3 GM-% IV SOLR
INTRAVENOUS | Status: AC
  Filled 2013-12-22: qty 50

## 2013-12-22 MED ORDER — BUPIVACAINE-EPINEPHRINE (PF) 0.25% -1:200000 IJ SOLN
INTRAMUSCULAR | Status: AC
  Filled 2013-12-22: qty 30

## 2013-12-22 MED ORDER — HYDROMORPHONE HCL 1 MG/ML IJ SOLN
0.2500 mg | INTRAMUSCULAR | Status: DC | PRN
  Administered 2013-12-22 (×2): 0.5 mg via INTRAVENOUS

## 2013-12-22 MED ORDER — OXYCODONE HCL 5 MG/5ML PO SOLN: 5.0000 mg | Freq: Once | ORAL | Status: AC | PRN

## 2013-12-22 MED ORDER — MIDAZOLAM HCL 2 MG/2ML IJ SOLN: 1.0000 mg | INTRAMUSCULAR | Status: DC | PRN

## 2013-12-22 MED ORDER — LACTATED RINGERS IV SOLN
INTRAVENOUS | Status: DC
  Administered 2013-12-22 (×2): via INTRAVENOUS

## 2013-12-22 MED ORDER — OXYCODONE HCL 5 MG PO TABS
5.0000 mg | ORAL_TABLET | ORAL | Status: DC | PRN
  Administered 2013-12-22: 5 mg via ORAL

## 2013-12-22 MED ORDER — MIDAZOLAM HCL 2 MG/2ML IJ SOLN
INTRAMUSCULAR | Status: AC
  Filled 2013-12-22: qty 2

## 2013-12-22 MED ORDER — CEFAZOLIN SODIUM 1-5 GM-% IV SOLN
INTRAVENOUS | Status: AC
  Filled 2013-12-22: qty 50

## 2013-12-22 MED ORDER — ONDANSETRON HCL 4 MG/2ML IJ SOLN
INTRAMUSCULAR | Status: DC | PRN
  Administered 2013-12-22: 4 mg via INTRAVENOUS

## 2013-12-22 MED ORDER — SODIUM CHLORIDE 0.9 % IJ SOLN: 3.0000 mL | INTRAMUSCULAR | Status: DC | PRN

## 2013-12-22 MED ORDER — BUPIVACAINE-EPINEPHRINE 0.25% -1:200000 IJ SOLN
INTRAMUSCULAR | Status: DC | PRN
  Administered 2013-12-22: 20 mL

## 2013-12-22 MED ORDER — FENTANYL CITRATE 0.05 MG/ML IJ SOLN
INTRAMUSCULAR | Status: DC | PRN
  Administered 2013-12-22: 50 ug via INTRAVENOUS
  Administered 2013-12-22: 100 ug via INTRAVENOUS

## 2013-12-22 MED ORDER — ONDANSETRON HCL 4 MG/2ML IJ SOLN: 4.0000 mg | Freq: Once | INTRAMUSCULAR | Status: DC | PRN

## 2013-12-22 MED ORDER — PROPOFOL 10 MG/ML IV BOLUS
INTRAVENOUS | Status: DC | PRN
  Administered 2013-12-22: 200 mg via INTRAVENOUS

## 2013-12-22 MED ORDER — ACETAMINOPHEN 650 MG RE SUPP: 650.0000 mg | RECTAL | Status: DC | PRN

## 2013-12-22 MED ORDER — FENTANYL CITRATE 0.05 MG/ML IJ SOLN: 50.0000 ug | INTRAMUSCULAR | Status: DC | PRN

## 2013-12-22 SURGICAL SUPPLY — 48 items
BLADE CLIPPER SURG (BLADE) ×2 IMPLANT
BLADE HEX COATED 2.75 (ELECTRODE) ×3 IMPLANT
BLADE SURG 10 STRL SS (BLADE) IMPLANT
BLADE SURG 15 STRL LF DISP TIS (BLADE) ×1 IMPLANT
BLADE SURG 15 STRL SS (BLADE) ×3
CANISTER SUCT 1200ML W/VALVE (MISCELLANEOUS) ×2 IMPLANT
CHLORAPREP W/TINT 26ML (MISCELLANEOUS) ×3 IMPLANT
CLIP TI MEDIUM 6 (CLIP) ×10 IMPLANT
CLIP TI WIDE RED SMALL 6 (CLIP) ×2 IMPLANT
COVER BACK TABLE 60X90IN (DRAPES) ×3 IMPLANT
COVER MAYO STAND STRL (DRAPES) ×3 IMPLANT
DECANTER SPIKE VIAL GLASS SM (MISCELLANEOUS) IMPLANT
DRAPE PED LAPAROTOMY (DRAPES) ×3 IMPLANT
DRAPE UTILITY XL STRL (DRAPES) ×3 IMPLANT
ELECT REM PT RETURN 9FT ADLT (ELECTROSURGICAL) ×3
ELECTRODE REM PT RTRN 9FT ADLT (ELECTROSURGICAL) ×1 IMPLANT
GLOVE BIO SURGEON STRL SZ 6 (GLOVE) ×3 IMPLANT
GLOVE BIO SURGEON STRL SZ7 (GLOVE) ×2 IMPLANT
GLOVE BIOGEL PI IND STRL 6.5 (GLOVE) ×1 IMPLANT
GLOVE BIOGEL PI INDICATOR 6.5 (GLOVE) ×2
GLOVE EXAM NITRILE EXT CUFF MD (GLOVE) ×2 IMPLANT
GOWN STRL REUS W/ TWL LRG LVL3 (GOWN DISPOSABLE) ×1 IMPLANT
GOWN STRL REUS W/ TWL XL LVL3 (GOWN DISPOSABLE) IMPLANT
GOWN STRL REUS W/TWL 2XL LVL3 (GOWN DISPOSABLE) ×3 IMPLANT
GOWN STRL REUS W/TWL LRG LVL3 (GOWN DISPOSABLE)
GOWN STRL REUS W/TWL XL LVL3 (GOWN DISPOSABLE) ×3
LIQUID BAND (GAUZE/BANDAGES/DRESSINGS) ×3 IMPLANT
NDL HYPO 25X1 1.5 SAFETY (NEEDLE) ×1 IMPLANT
NEEDLE HYPO 25X1 1.5 SAFETY (NEEDLE) ×3 IMPLANT
NS IRRIG 1000ML POUR BTL (IV SOLUTION) ×2 IMPLANT
PACK BASIN DAY SURGERY FS (CUSTOM PROCEDURE TRAY) ×3 IMPLANT
PENCIL BUTTON HOLSTER BLD 10FT (ELECTRODE) ×3 IMPLANT
SLEEVE SCD COMPRESS KNEE MED (MISCELLANEOUS) ×2 IMPLANT
SPONGE LAP 18X18 X RAY DECT (DISPOSABLE) ×3 IMPLANT
STAPLER VISISTAT 35W (STAPLE) IMPLANT
SUT MON AB 4-0 PC3 18 (SUTURE) ×2 IMPLANT
SUT VIC AB 2-0 SH 27 (SUTURE) ×6
SUT VIC AB 2-0 SH 27XBRD (SUTURE) IMPLANT
SUT VIC AB 3-0 54X BRD REEL (SUTURE) IMPLANT
SUT VIC AB 3-0 BRD 54 (SUTURE)
SUT VIC AB 3-0 SH 27 (SUTURE) ×3
SUT VIC AB 3-0 SH 27X BRD (SUTURE) IMPLANT
SYR CONTROL 10ML LL (SYRINGE) ×3 IMPLANT
TOWEL OR 17X24 6PK STRL BLUE (TOWEL DISPOSABLE) ×3 IMPLANT
TOWEL OR NON WOVEN STRL DISP B (DISPOSABLE) ×1 IMPLANT
TUBE CONNECTING 20'X1/4 (TUBING) ×1
TUBE CONNECTING 20X1/4 (TUBING) ×1 IMPLANT
YANKAUER SUCT BULB TIP NO VENT (SUCTIONS) ×2 IMPLANT

## 2013-12-22 NOTE — Anesthesia Procedure Notes (Signed)
Procedure Name: LMA Insertion Date/Time: 12/22/2013 2:40 PM Performed by: Lieutenant Diego Pre-anesthesia Checklist: Patient identified, Emergency Drugs available, Suction available and Patient being monitored Patient Re-evaluated:Patient Re-evaluated prior to inductionOxygen Delivery Method: Circle System Utilized Preoxygenation: Pre-oxygenation with 100% oxygen Intubation Type: IV induction Ventilation: Mask ventilation without difficulty LMA: LMA inserted LMA Size: 4.0 Number of attempts: 1 Airway Equipment and Method: bite block Placement Confirmation: positive ETCO2 and breath sounds checked- equal and bilateral Tube secured with: Tape Dental Injury: Teeth and Oropharynx as per pre-operative assessment

## 2013-12-22 NOTE — Interval H&P Note (Signed)
History and Physical Interval Note:  12/22/2013 2:24 PM  [place]  has presented today for surgery, with the diagnosis of left groin lymphadenopathy  The various methods of treatment have been discussed with the patient and family. After consideration of risks, benefits and other options for treatment, the patient has consented to  Procedure(s): LEFT INGUINAL LYMPH NODE BIOPSY (Left) as a surgical intervention .  The patient's history has been reviewed, patient examined, no change in status, stable for surgery.  I have reviewed the patient's chart and labs.  Questions were answered to the patient's satisfaction.     Jared Bentley

## 2013-12-22 NOTE — Anesthesia Preprocedure Evaluation (Signed)
Anesthesia Evaluation  Patient identified by MRN, date of birth, ID band Patient awake    Reviewed: Allergy & Precautions, H&P , NPO status , Patient's Chart, lab work & pertinent test results  Airway Mallampati: II  TM Distance: >3 FB Neck ROM: Full    Dental  (+) Teeth Intact, Dental Advisory Given   Pulmonary former smoker,  breath sounds clear to auscultation        Cardiovascular hypertension, Pt. on medications Rhythm:Regular Rate:Normal     Neuro/Psych    GI/Hepatic   Endo/Other  Morbid obesity  Renal/GU      Musculoskeletal   Abdominal   Peds  Hematology   Anesthesia Other Findings   Reproductive/Obstetrics                             Anesthesia Physical Anesthesia Plan  ASA: II  Anesthesia Plan: General   Post-op Pain Management:    Induction: Intravenous  Airway Management Planned: LMA  Additional Equipment:   Intra-op Plan:   Post-operative Plan: Extubation in OR  Informed Consent: I have reviewed the patients History and Physical, chart, labs and discussed the procedure including the risks, benefits and alternatives for the proposed anesthesia with the patient or authorized representative who has indicated his/her understanding and acceptance.   Dental advisory given  Plan Discussed with: CRNA, Anesthesiologist and Surgeon  Anesthesia Plan Comments:         Anesthesia Quick Evaluation

## 2013-12-22 NOTE — Op Note (Signed)
Pre op diagnosis:  Left inguinal Lymphadenopathy  Post op diagnosis:  Same  Procedure performed:  Left inguinal lymph node biopsy  Surgeon:  [name], MD  Anesthesia:  General and local  EBL:  Minimal  Specimen:  L inguinal lymph node for lymphoma workup  Procedure:   Patient was identified in the holding area and taken to the operating room and placed supine on the operating room table.  General anesthesia was induced with LMA.  The patient's abdomen and left groin was clipped, prepped and draped in sterile fashion.  Time out was performed according to the surgical safety check list.  When all was correct, we continued.    A line was marked for the incision in the L groin.  This was infiltrated with local anesthetic.  The skin was incised with a #15 blade.  The subcutaneous tissues were divided with the cautery.  A Wietlaner retractor was used to assist with visualization.  Scarpa's fascia was divided.    A very large conglomerate of matted lymph nodes was immediately apparent.  This was elevated with a Babcock clamp.  Hemaclips were used to ligate the lymphovascular channels entering the node from all sides.   Once this was complete, the node was passed off for lymphoma workup.  The lymph node complex had to be divided at the deep margin.  The superficial epigastric artery was tied off.  The cavity was irrigated copiously.  Hemostasis was achieved with the cautery.  The wound was closed with interrupted 3-0 Vicryl deep dermal sutures and 4-0 Monocryl running subcuticular suture.  The wound was cleaned, dried and dressed with Dermabond.    The patient was awakened from anesthesia and taken to the PACU in stable condition.  Needle, sponge, and instrument counts were correct.

## 2013-12-22 NOTE — H&P (View-Only) (Signed)
Jared Bentley 12/21/2013 11:02 AM Location: [facility] Patient #: [id number] DOB: 04/18/1955 Married / Language: Jared Bentley / Race: White Male  History of Present Illness [name] MD; 12/21/2013 11:50 AM) Patient words: groin mass.  The patient is a 58 year old male who presents with lymphadenopathy. Pt is referred by Dr. Wolfgang Phoenix for consultation regarding an enlarged left inguinal node. He noticed this around 2 month(s) ago. Symptoms include palpable lump and visible lump, while symptoms do not include fever or weight loss. The lymphadenopathy is located in the left inguinal region. The nodes are described as firm. The patient describes this as growing larger. Associated symptoms do not include abdominal mass, neck mass, scrotal swelling, lethargy, night sweats or edema. Pertinent medical history does not include cellulitis, leukemia, lymphoma or intra-abdominal neoplasm. Dr. Wolfgang Phoenix ordered a CT abd/pelvis on him and he has LAD all along the iliac chain.  He has a family history of leukemia in his father and tongue cancer in his mother.    Other Problems [name], CMA; 12/21/2013 11:03 AM) High blood pressure Sleep Apnea  Past Surgical History [name], [place]; 12/21/2013 11:03 AM) Oral Surgery Vasectomy  Diagnostic Studies History [name], [place]; 12/21/2013 11:03 AM) Colonoscopy 5-10 years ago  Allergies [name], CMA; 12/21/2013 11:03 AM) No Known Drug Allergies11/16/2015  Medication History [name], [place]; 12/21/2013 11:03 AM) Enalapril Maleate (20MG  Tablet, Oral) Active.  Social History [name], [place]; 12/21/2013 11:03 AM) Alcohol use Heavy alcohol use. Caffeine use Carbonated beverages. No drug use Tobacco use Former smoker.  Family History [name], [place]; 12/21/2013 11:03 AM) Cancer Father, Mother. Thyroid problems Mother.  Review of Systems ([place]. Brooks CMA; 12/21/2013  11:03 AM) General Not Present- Appetite Loss, Chills, Fatigue, Fever, Night Sweats, Weight Gain and Weight Loss. Skin Not Present- Change in Wart/Mole, Dryness, Hives, Jaundice, New Lesions, Non-Healing Wounds, Rash and Ulcer. HEENT Not Present- Earache, Hearing Loss, Hoarseness, Nose Bleed, Oral Ulcers, Ringing in the Ears, Seasonal Allergies, Sinus Pain, Sore Throat, Visual Disturbances, Wears glasses/contact lenses and Yellow Eyes. Respiratory Present- Snoring. Not Present- Bloody sputum, Chronic Cough, Difficulty Breathing and Wheezing. Cardiovascular Not Present- Chest Pain, Difficulty Breathing Lying Down, Leg Cramps, Palpitations, Rapid Heart Rate, Shortness of Breath and Swelling of Extremities. Gastrointestinal Not Present- Abdominal Pain, Bloating, Bloody Stool, Change in Bowel Habits, Chronic diarrhea, Constipation, Difficulty Swallowing, Excessive gas, Gets full quickly at meals, Hemorrhoids, Indigestion, Nausea, Rectal Pain and Vomiting. Male Genitourinary Not Present- Blood in Urine, Change in Urinary Stream, Frequency, Impotence, Nocturia, Painful Urination, Urgency and Urine Leakage. Musculoskeletal Not Present- Back Pain, Joint Pain, Joint Stiffness, Muscle Pain, Muscle Weakness and Swelling of Extremities. Neurological Not Present- Decreased Memory, Fainting, Headaches, Numbness, Seizures, Tingling, Tremor, Trouble walking and Weakness. Psychiatric Not Present- Anxiety, Bipolar, Change in Sleep Pattern, Depression, Fearful and Frequent crying. Endocrine Not Present- Cold Intolerance, Excessive Hunger, Hair Changes, Heat Intolerance, Hot flashes and New Diabetes. Hematology Not Present- Easy Bruising, Excessive bleeding, Gland problems, HIV and Persistent Infections.   Vitals [employer] R. Brooks CMA; 12/21/2013 11:03 AM) 12/21/2013 11:02 AM Weight: 271.13 lb Height: 72in Body Surface Area: 2.5 m Body Mass Index: 36.77 kg/m Pulse: 74 (Regular)  Resp.: 14 (Unlabored)   BP: 120/80 (Sitting, Left Arm, Standard)    Physical Exam [name] MD; 12/21/2013 11:53 AM) General Mental Status-Alert. General Appearance-Consistent with stated age. Hydration-Well hydrated. Voice-Normal.  Head and Neck Head-normocephalic, atraumatic with no lesions or palpable masses. Trachea-midline.  Thyroid Gland Characteristics - normal size and consistency.  Eye Eyeball - Bilateral-Extraocular movements intact. Sclera/Conjunctiva - Bilateral-No scleral icterus.  Chest and Lung Exam Chest and lung exam reveals -quiet, even and easy respiratory effort with no use of accessory muscles and on auscultation, normal breath sounds, no adventitious sounds and normal vocal resonance. Inspection Chest Wall - Normal. Back - normal.  Cardiovascular Cardiovascular examination reveals -normal heart sounds, regular rate and rhythm with no murmurs and normal pedal pulses bilaterally.  Abdomen Inspection Inspection of the abdomen reveals - No Hernias. Palpation/Percussion Palpation and Percussion of the abdomen reveal - Soft, Non Tender, No Rebound tenderness, No Rigidity (guarding) and No hepatosplenomegaly. Auscultation Auscultation of the abdomen reveals - Bowel sounds normal.  Neurologic Neurologic evaluation reveals -alert and oriented x 3 with no impairment of recent or remote memory. Mental Status-Normal.  Musculoskeletal Global Assessment -Note: no gross deformities.  Normal Exam - Left-Upper Extremity Strength Normal and Lower Extremity Strength Normal. Normal Exam - Right-Upper Extremity Strength Normal and Lower Extremity Strength Normal.  Lymphatic Head & Neck  General Head & Neck Lymphatics: Bilateral - Description - Normal. Axillary  General Axillary Region: Bilateral - Description - Normal. Tenderness - Non Tender. Femoral & Inguinal  Generalized Femoral & Inguinal Lymphatics: Bilateral - Description - No Generalized  lymphadenopathy. Inguinal nodes: Left - Size - 2.5 cm. Consistency - Firm. Mobility - Mobile. Shape - Matted nodes. Overlying skin - Normal. Tenderness - Non Tender.    Assessment & Plan [name] MD; 12/21/2013 11:55 AM) INGUINAL ADENOPATHY (785.6  R59.9) Impression: Plan LN biopsy.  Reviewed risks of surgery including bleeding, infection, pain, swelling, possible need for additional procedures.  Even if this is not lymphoma, it appears to be some type of malignancy. I will defer further staging to oncology, as the studies they may order might vary according to type of malignancy. Current Plans  Referred to Oncology, for evaluation and follow up (Oncology). Instructions:   Signed by [name], MD (12/21/2013 11:55 AM)

## 2013-12-22 NOTE — Transfer of Care (Signed)
Immediate Anesthesia Transfer of Care Note  Patient: Jared Bentley  Procedure(s) Performed: Procedure(s): LEFT INGUINAL LYMPH NODE BIOPSY (Left)  Patient Location: PACU  Anesthesia Type:General  Level of Consciousness: awake, alert  and oriented  Airway & Oxygen Therapy: Patient Spontanous Breathing and Patient connected to face mask oxygen  Post-op Assessment: Report given to PACU RN and Post -op Vital signs reviewed and stable  Post vital signs: Reviewed and stable  Complications: No apparent anesthesia complications

## 2013-12-22 NOTE — Anesthesia Postprocedure Evaluation (Signed)
  Anesthesia Post-op Note  Patient: Jared Bentley  Procedure(s) Performed: Procedure(s): LEFT INGUINAL LYMPH NODE BIOPSY (Left)  Patient Location: PACU  Anesthesia Type: General   Level of Consciousness: awake, alert  and oriented  Airway and Oxygen Therapy: Patient Spontanous Breathing  Post-op Pain: mild  Post-op Assessment: Post-op Vital signs reviewed  Post-op Vital Signs: Reviewed  Last Vitals:  Filed Vitals:   12/22/13 1545  BP: 118/77  Pulse: 73  Temp:   Resp: 15    Complications: No apparent anesthesia complications

## 2013-12-22 NOTE — Discharge Instructions (Addendum)
[facility] Phone Number [phone]   POST OP INSTRUCTIONS  Always review your discharge instruction sheet given to you by the facility where your surgery was performed.  IF YOU HAVE DISABILITY OR FAMILY LEAVE FORMS, YOU MUST BRING THEM TO THE OFFICE FOR PROCESSING.  DO NOT GIVE THEM TO YOUR DOCTOR.  1. A prescription for pain medication may be given to you upon discharge.  Take your pain medication as prescribed, if needed.  If narcotic pain medicine is not needed, then you may take acetaminophen (Tylenol) or ibuprofen (Advil) as needed. 2. Take your usually prescribed medications unless otherwise directed 3. If you need a refill on your pain medication, please contact your pharmacy.  They will contact our office to request authorization.  Prescriptions will not be filled after 5pm or on week-ends. 4. You should eat very light the first 24 hours after surgery, such as soup, crackers, pudding, etc.  Resume your normal diet the day after surgery 5. It is common to experience some constipation if taking pain medication after surgery.  Increasing fluid intake and taking a stool softener will usually help or prevent this problem from occurring.  A mild laxative (Milk of Magnesia or Miralax) should be taken according to package directions if there are no bowel movements after 48 hours. 6. You may shower in 48 hours.  The surgical glue will flake off in 2-3 weeks.   7. ACTIVITIES:  No strenuous activity or heavy lifting for 1 week.   a. You may drive when you no longer are taking prescription pain medication, you can comfortably wear a seatbelt, and you can safely maneuver your car and apply brakes. b. RETURN TO WORK:  __________as tolerated as long as restrictions are followed____________ Dennis Bast should see your doctor in the office for a follow-up appointment approximately three-four weeks after your surgery.    WHEN TO CALL YOUR DOCTOR: 1. Fever over 101.0 2. Nausea and/or  vomiting. 3. Extreme swelling or bruising. 4. Continued bleeding from incision. 5. Increased pain, redness, or drainage from the incision.  The clinic staff is available to answer your questions during regular business hours.  Please dont hesitate to call and ask to speak to one of the nurses for clinical concerns.  If you have a medical emergency, go to the nearest emergency room or call 911.  A surgeon from [facility] is always on call at the hospital.  For further questions, please visit [email or web address]   Post Anesthesia Home Care Instructions  Activity: Get plenty of rest for the remainder of the day. A responsible adult should stay with you for 24 hours following the procedure.  For the next 24 hours, DO NOT: -Drive a car -Paediatric nurse -Drink alcoholic beverages -Take any medication unless instructed by your physician -Make any legal decisions or sign important papers.  Meals: Start with liquid foods such as gelatin or soup. Progress to regular foods as tolerated. Avoid greasy, spicy, heavy foods. If nausea and/or vomiting occur, drink only clear liquids until the nausea and/or vomiting subsides. Call your physician if vomiting continues.  Special Instructions/Symptoms: Your throat may feel dry or sore from the anesthesia or the breathing tube placed in your throat during surgery. If this causes discomfort, gargle with warm salt water. The discomfort should disappear within 24 hours.

## 2013-12-23 ENCOUNTER — Encounter (HOSPITAL_BASED_OUTPATIENT_CLINIC_OR_DEPARTMENT_OTHER): Payer: Self-pay | Admitting: General Surgery

## 2013-12-28 ENCOUNTER — Telehealth (INDEPENDENT_AMBULATORY_CARE_PROVIDER_SITE_OTHER): Payer: Self-pay | Admitting: General Surgery

## 2013-12-28 NOTE — Telephone Encounter (Signed)
Discussed pathology with patient

## 2013-12-29 ENCOUNTER — Encounter: Payer: Self-pay | Admitting: Family Medicine

## 2013-12-29 ENCOUNTER — Telehealth: Payer: Self-pay | Admitting: Hematology and Oncology

## 2013-12-29 ENCOUNTER — Ambulatory Visit (INDEPENDENT_AMBULATORY_CARE_PROVIDER_SITE_OTHER): Payer: BC Managed Care – PPO | Admitting: Family Medicine

## 2013-12-29 VITALS — BP 102/80 | Ht 72.0 in | Wt 269.0 lb

## 2013-12-29 DIAGNOSIS — G4733 Obstructive sleep apnea (adult) (pediatric): Secondary | ICD-10-CM

## 2013-12-29 DIAGNOSIS — I10 Essential (primary) hypertension: Secondary | ICD-10-CM

## 2013-12-29 NOTE — Progress Notes (Signed)
   Subjective:    Patient ID: Jared Bentley, male    DOB: 1956/02/03, 58 y.o.   MRN: [id number]  Hypertension This is a chronic problem. The current episode started more than 1 year ago. The problem has been gradually improving since onset. The problem is controlled. There are no associated agents to hypertension. There are no known risk factors for coronary artery disease. Treatments tried: enalapril. The current treatment provides significant improvement. There are no compliance problems.    Patient now on Cipro device for sleep apnea. Handling it well. Uses it nightly. Definitely helps with restfulness during the day.  Had excisional biopsy last week. Confirm presence of lymphoma. Still some localized pain at affected area.  Due to see oncologist soon appointment yet to be scheduled.   Patient states that he has no other concerns at this time.   bp meds faithful  Uses cpap nightly,  Helps sleep quality  Exercise suboptimum, no sig bruising at the site of surg    Review of Systems No chest pain no headache no back pain no abdominal pain no change in bowel habits no blood in stool ROS otherwise negative    Objective:   Physical Exam  Alert no apparent distress vital stable blood pressure good on repeat HEENT normal. Lungs clear. Heart regular in rhythm. Left groin surgical site healing well      Assessment & Plan:  Impression 1 hypertension good control #2 sleep apnea good control with device #3 new diagnosis lymphoma discussed at length plan pneumonia and flu shot in encourage. Maintain other medications. Diet exercise discussed. Check in 6 months. WSL

## 2013-12-29 NOTE — Telephone Encounter (Signed)
S/W PATIENT AND GAVE NP APPT FOR 11/25 @ 1:30 W/DR. Slinger

## 2013-12-30 ENCOUNTER — Encounter: Payer: Self-pay | Admitting: Hematology and Oncology

## 2013-12-30 ENCOUNTER — Ambulatory Visit: Payer: BC Managed Care – PPO

## 2013-12-30 ENCOUNTER — Encounter: Payer: Self-pay | Admitting: *Deleted

## 2013-12-30 ENCOUNTER — Telehealth: Payer: Self-pay | Admitting: Hematology and Oncology

## 2013-12-30 ENCOUNTER — Ambulatory Visit (HOSPITAL_BASED_OUTPATIENT_CLINIC_OR_DEPARTMENT_OTHER): Payer: BC Managed Care – PPO | Admitting: Hematology and Oncology

## 2013-12-30 VITALS — BP 122/75 | HR 62 | Temp 98.1°F | Resp 18 | Ht 72.0 in | Wt 262.9 lb

## 2013-12-30 DIAGNOSIS — Z8572 Personal history of non-Hodgkin lymphomas: Secondary | ICD-10-CM | POA: Insufficient documentation

## 2013-12-30 DIAGNOSIS — C8330 Diffuse large B-cell lymphoma, unspecified site: Secondary | ICD-10-CM

## 2013-12-30 DIAGNOSIS — Z299 Encounter for prophylactic measures, unspecified: Secondary | ICD-10-CM | POA: Insufficient documentation

## 2013-12-30 DIAGNOSIS — Z23 Encounter for immunization: Secondary | ICD-10-CM

## 2013-12-30 DIAGNOSIS — F1020 Alcohol dependence, uncomplicated: Secondary | ICD-10-CM | POA: Insufficient documentation

## 2013-12-30 HISTORY — DX: Diffuse large B-cell lymphoma, unspecified site: C83.30

## 2013-12-30 MED ORDER — INFLUENZA VAC SPLIT QUAD 0.5 ML IM SUSY
0.5000 mL | PREFILLED_SYRINGE | Freq: Once | INTRAMUSCULAR | Status: AC
  Administered 2013-12-30: 0.5 mL via INTRAMUSCULAR
  Filled 2013-12-30: qty 0.5

## 2013-12-30 MED ORDER — PNEUMOCOCCAL 13-VAL CONJ VACC IM SUSP
0.5000 mL | Freq: Once | INTRAMUSCULAR | Status: AC
  Administered 2013-12-30: 0.5 mL via INTRAMUSCULAR
  Filled 2013-12-30: qty 0.5

## 2013-12-30 NOTE — Progress Notes (Signed)
Pt scheduled for Sedated BMBX at [facility] Stay 12/03 at 8 am.  Notified Butch Penny in [employer].  Written instructions date/time given to pt/wife.  Instructed NPO after midnight and to arrive at Short Stay at 7 am on 12/03.  They verbalized understanding.

## 2013-12-30 NOTE — Assessment & Plan Note (Signed)
The patient is recommended to quit alcohol intake because this will interact with his chemotherapy.

## 2013-12-30 NOTE — Assessment & Plan Note (Signed)
We discussed the importance of preventive care and reviewed the vaccination programs. He does not have any prior allergic reactions to influenza vaccination. He agrees to proceed with influenza vaccination today and we will administer it today and Prevnar injection in the clinic.

## 2013-12-30 NOTE — Assessment & Plan Note (Addendum)
I have a long discussion with the patient and his wife. We discussed the importance of staging. I will order a PET CT scan, ECHO, bone marrow biopsy and blood work next week. The patient will need placement of port and I will contact his surgeon for this. I will review his case at the next hematology tumor board next week. 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 I discussed with him approach of treatment would involve R-CHOP chemotherapy. This is considered a standard of care. Currently, we have 2 clinical trials available and he is interested to participate in the prevent trial. I will get my research coordinator to talk to the patient about this. I will schedule chemotherapy education class. I will tentatively plan on starting his first dose of chemotherapy under 01/12/2014. 

## 2013-12-30 NOTE — Progress Notes (Signed)
Checked in new pt with no financial concerns at this time.  Pt has my card for any questions or concerns.

## 2013-12-30 NOTE — Telephone Encounter (Signed)
Gave avs & cal for Dec. Sent mess to sch tx. Sent mess to auth echo.

## 2013-12-30 NOTE — Progress Notes (Signed)
[facility] NOTE  Patient Care Team: Mikey Kirschner, MD as PCP - General (Family Medicine)  CHIEF COMPLAINTS/PURPOSE OF CONSULTATION:  Newly diagnosed high-grade lymphoma  HISTORY OF PRESENTING ILLNESS:  Jared Bentley 58 y.o. male is here because of recent diagnosis of lymphoma. The patient palpated inguinal lymphadenopathy approximately 2-1/2 months ago which is not painful. He thought he was a hernia and presented to the PCP. I reviewed his records extensively and summarized as follows:   Diffuse large B cell lymphoma   12/14/2013 Imaging CT scan of the abdomen and pelvis showed bulky left iliac chain and left inguinal adenopathy, highly worrisome for lymphoma.   12/22/2013 Surgery He underwent excisional lymph node biopsy of the left inguinal region that confirm lymphoma diagnosis. No   12/22/2013 Pathology Results Accession: [id number] pathology showed a high-grade follicular lymphoma with possibly foci of diffuse large B-cell lymphoma   Since his surgery, his wound is healing well. He denies any B symptoms.  MEDICAL HISTORY:  Past Medical History  Diagnosis Date  . Hypertension   . Chronic back pain   . ED (erectile dysfunction)   . Glucose intolerance (impaired glucose tolerance)   . Anxiety   . Sleep apnea     severe OSA-uses a cpap  . Heavy alcohol consumption     6-12 beers daily  . Carpal tunnel syndrome   . Cancer     NHL  . Diffuse large B cell lymphoma 12/30/2013    SURGICAL HISTORY: Past Surgical History  Procedure Laterality Date  . Vasectomy    . Colonoscopy w/ polypectomy    . Wisdom tooth extraction    . Lymph node biopsy Left 12/22/2013    Procedure: LEFT INGUINAL LYMPH NODE BIOPSY;  Surgeon: [name], MD;  Location: [facility];  Service: General;  Laterality: Left;    SOCIAL HISTORY: History   Social History  . Marital Status: Married    Spouse Name: N/A    Number of Children: N/A  . Years of  Education: N/A   Occupational History  . Not on file.   Social History Main Topics  . Smoking status: Former Smoker -- 1.00 packs/day for 30 years    Quit date: 05/14/2004  . Smokeless tobacco: Never Used  . Alcohol Use: Yes     Comment: daily-6-12 beer  . Drug Use: No  . Sexual Activity: Not on file   Other Topics Concern  . Not on file   Social History Narrative    FAMILY HISTORY: Family History  Problem Relation Age of Onset  . Cancer Father     bone  . Cancer Mother     tongue ca    ALLERGIES:  has No Known Allergies.  MEDICATIONS:  Current Outpatient Prescriptions  Medication Sig Dispense Refill  . enalapril (VASOTEC) 20 MG tablet TAKE 1 TABLET BY MOUTH AT BEDTIME (Patient taking differently: TAKE 1 TABLET BY MOUTH daily) 90 tablet 1  . pyridOXINE (VITAMIN B-6) 100 MG tablet Take 100 mg by mouth daily.    . sildenafil (VIAGRA) 100 MG tablet Take 100 mg by mouth. 1/2 tablet daily prn     No current facility-administered medications for this visit.    REVIEW OF SYSTEMS:   Constitutional: Denies fevers, chills or abnormal night sweats Eyes: Denies blurriness of vision, double vision or watery eyes Ears, nose, mouth, throat, and face: Denies mucositis or sore throat Respiratory: Denies cough, dyspnea or wheezes Cardiovascular: Denies palpitation, chest discomfort  or lower extremity swelling Gastrointestinal:  Denies nausea, heartburn or change in bowel habits Skin: Denies abnormal skin rashes Neurological:Denies numbness, tingling or new weaknesses Behavioral/Psych: Mood is stable, no new changes  All other systems were reviewed with the patient and are negative.  PHYSICAL EXAMINATION: ECOG PERFORMANCE STATUS: 0 - Asymptomatic  Filed Vitals:   12/30/13 1329  BP: 122/75  Pulse: 62  Temp: 98.1 F (36.7 C)  Resp: 18   Filed Weights   12/30/13 1329  Weight: 262 lb 14.4 oz (119.251 kg)    GENERAL:alert, no distress and comfortable SKIN: skin color,  texture, turgor are normal, no rashes or significant lesions EYES: normal, conjunctiva are pink and non-injected, sclera clear OROPHARYNX:no exudate, no erythema and lips, buccal mucosa, and tongue normal  NECK: supple, thyroid normal size, non-tender, without nodularity LYMPH:  Well-healed surgical scar on the left inguinal region. LUNGS: clear to auscultation and percussion with normal breathing effort HEART: regular rate & rhythm and no murmurs and no lower extremity edema ABDOMEN:abdomen soft, non-tender and normal bowel sounds Musculoskeletal:no cyanosis of digits and no clubbing  PSYCH: alert & oriented x 3 with fluent speech NEURO: no focal motor/sensory deficits  LABORATORY DATA:  I have reviewed the data as listed Lab Results  Component Value Date   HGB 16.0 12/22/2013    Recent Labs  06/30/13 0951 10/06/13 0713 12/09/13 0716  NA 135  --  140  K 4.7  --  4.6  CL 100  --  103  CO2 28  --  25  GLUCOSE 106*  --  90  BUN 15  --  12  CREATININE 1.02  --  0.98  CALCIUM 9.8  --  9.6  PROT 6.7 6.6  --   ALBUMIN 4.5 4.1  --   AST 63* 73*  --   ALT 113* 128*  --   ALKPHOS 46 44  --   BILITOT 0.7 0.9  --   BILIDIR 0.2 0.2  --   IBILI 0.5 0.7  --     RADIOGRAPHIC STUDIES: I reviewed the imaging with the patient and family. I have personally reviewed the radiological images as listed and agreed with the findings in the report. Ct Abdomen Pelvis W Contrast  12/14/2013   CLINICAL DATA:  Left inguinal mass for 2-3 months.  EXAM: CT ABDOMEN AND PELVIS WITH CONTRAST  TECHNIQUE: Multidetector CT imaging of the abdomen and pelvis was performed using the standard protocol following bolus administration of intravenous contrast.  CONTRAST:  133mL OMNIPAQUE IOHEXOL 300 MG/ML  SOLN  COMPARISON:  None.  FINDINGS: Lower chest: Lung bases show scattered scarring and calcified granulomas. Heart size normal. No pericardial or pleural effusion.  Hepatobiliary: Liver is decreased in attenuation  diffusely. Liver and gallbladder are otherwise unremarkable. No biliary ductal dilatation.  Pancreas: Negative.  Spleen: Negative.  Adrenals/Urinary Tract: Adrenal glands and kidneys are unremarkable. Ureters are decompressed. Bladder is somewhat low in volume, which may account for apparent bladder wall thickening.  Stomach/Bowel: Stomach, small bowel, appendix and colon are unremarkable.  Vascular/Lymphatic: Atherosclerotic calcification of the arterial vasculature without abdominal aortic aneurysm. Left iliac chain adenopathy measures up to 2.7 x 5.3 cm (series 2, image 81) in the external iliac station. Left inguinal adenopathy measures up to 3.0 x 5.3 cm (image 87).  Reproductive: Prostate is normal in size.  Other: No free fluid. Mesenteries and peritoneum are otherwise unremarkable.  Musculoskeletal: No worrisome lytic or sclerotic lesions. Degenerative changes are seen in the spine.  IMPRESSION: 1. Bulky left iliac chain and left inguinal adenopathy, highly worrisome for lymphoma. 2. Fatty liver.   Electronically Signed   By: Lorin Picket M.D.   On: 12/14/2013 08:40    ASSESSMENT & PLAN:  Diffuse large B cell lymphoma I have a long discussion with the patient and his wife. We discussed the importance of staging. I will order a PET CT scan, ECHO, bone marrow biopsy and blood work next week. The patient will need placement of port and I will contact his surgeon for this. I will review his case at the next hematology tumor board next week. 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 I discussed with him approach of treatment would involve R-CHOP chemotherapy. This is considered a standard of care. Currently, we have 2 clinical trials available and he is interested to participate in the prevent trial. I will get my research coordinator to talk to the patient about this. I will schedule chemotherapy education class. I will tentatively plan  on starting his first dose of chemotherapy under 01/12/2014.  Chronic alcoholism The patient is recommended to quit alcohol intake because this will interact with his chemotherapy.  Preventive measure We discussed the importance of preventive care and reviewed the vaccination programs. He does not have any prior allergic reactions to influenza vaccination. He agrees to proceed with influenza vaccination today and we will administer it today and Prevnar injection in the clinic.      Orders Placed This Encounter  Procedures  . NM PET Image Initial (PI) Skull Base To Thigh    Standing Status: Future     Number of Occurrences:      Standing Expiration Date: 03/01/2015    Order Specific Question:  Reason for Exam (SYMPTOM  OR DIAGNOSIS REQUIRED)    Answer:  lymphoma staging    Order Specific Question:  Preferred imaging location?    Answer:  [facility]  . 2D Echocardiogram without contrast    Standing Status: Future     Number of Occurrences:      Standing Expiration Date: 02/03/2015    Order Specific Question:  Type of Echo    Answer:  Complete    Order Specific Question:  Reason for Exam    Answer:  lymphoma, need EF    Order Specific Question:  Where should this test be performed    Answer:  [facility]    All questions were answered. The patient knows to call the clinic with any problems, questions or concerns. I spent 55 minutes counseling the patient face to face. The total time spent in the appointment was 60 minutes and more than 50% was on counseling.     [facility], [place], MD 12/30/2013 3:39 PM

## 2013-12-31 ENCOUNTER — Other Ambulatory Visit (INDEPENDENT_AMBULATORY_CARE_PROVIDER_SITE_OTHER): Payer: Self-pay | Admitting: General Surgery

## 2014-01-01 ENCOUNTER — Telehealth: Payer: Self-pay | Admitting: *Deleted

## 2014-01-01 NOTE — Telephone Encounter (Signed)
BMBx moved from 12/03 to 11/30 per Dr. Alvy Bimler.  Informed pt and he verbalized understanding.  He is concerned about making it to Surgeon's appt on Monday at noon.  Informed pt he will be done in plenty of time to go to Surgeon's appt.same day.   Notified Carter in [employer] [facility] of date change for BMBx.

## 2014-01-01 NOTE — Telephone Encounter (Signed)
Per staff message and POF I have scheduled appts. Advised scheduler of appts. JMW  

## 2014-01-04 ENCOUNTER — Encounter (HOSPITAL_BASED_OUTPATIENT_CLINIC_OR_DEPARTMENT_OTHER): Payer: Self-pay | Admitting: *Deleted

## 2014-01-04 ENCOUNTER — Other Ambulatory Visit (INDEPENDENT_AMBULATORY_CARE_PROVIDER_SITE_OTHER): Payer: Self-pay | Admitting: General Surgery

## 2014-01-04 ENCOUNTER — Ambulatory Visit (HOSPITAL_COMMUNITY)
Admission: RE | Admit: 2014-01-04 | Discharge: 2014-01-04 | Disposition: A | Discharge status: Home / Self Care | Payer: BC Managed Care – PPO | Source: Ambulatory Visit | Attending: Hematology and Oncology | Admitting: Hematology and Oncology

## 2014-01-04 ENCOUNTER — Encounter (HOSPITAL_COMMUNITY): Payer: Self-pay

## 2014-01-04 ENCOUNTER — Other Ambulatory Visit: Payer: Self-pay | Admitting: Hematology and Oncology

## 2014-01-04 VITALS — BP 125/80 | HR 82 | Temp 98.0°F | Resp 20 | Ht 72.0 in | Wt 262.9 lb

## 2014-01-04 DIAGNOSIS — C8330 Diffuse large B-cell lymphoma, unspecified site: Secondary | ICD-10-CM | POA: Diagnosis not present

## 2014-01-04 DIAGNOSIS — C8590 Non-Hodgkin lymphoma, unspecified, unspecified site: Secondary | ICD-10-CM

## 2014-01-04 LAB — COMPREHENSIVE METABOLIC PANEL
ALT: 70 U/L — AB (ref 0–53)
AST: 46 U/L — AB (ref 0–37)
Albumin: 3.6 g/dL (ref 3.5–5.2)
Alkaline Phosphatase: 51 U/L (ref 39–117)
Anion gap: 11 (ref 5–15)
BUN: 15 mg/dL (ref 6–23)
CALCIUM: 9.7 mg/dL (ref 8.4–10.5)
CO2: 26 mEq/L (ref 19–32)
Chloride: 100 mEq/L (ref 96–112)
Creatinine, Ser: 0.92 mg/dL (ref 0.50–1.35)
GFR calc non Af Amer: >90 mL/min (ref >90)
GLUCOSE: 112 mg/dL — AB (ref 70–99)
Potassium: 4.1 mEq/L (ref 3.7–5.3)
Sodium: 137 mEq/L (ref 137–147)
TOTAL PROTEIN: 7 g/dL (ref 6.0–8.3)
Total Bilirubin: 0.5 mg/dL (ref 0.3–1.2)

## 2014-01-04 LAB — CBC WITH DIFFERENTIAL/PLATELET
BASOS ABS: 0 10*3/uL (ref 0.0–0.1)
BASOS PCT: 1 % (ref 0–1)
Eosinophils Absolute: 0.2 10*3/uL (ref 0.0–0.7)
Eosinophils Relative: 3 % (ref 0–5)
HCT: 45.6 % (ref 39.0–52.0)
Hemoglobin: 15.6 g/dL (ref 13.0–17.0)
Lymphocytes Relative: 25 % (ref 12–46)
Lymphs Abs: 1.7 10*3/uL (ref 0.7–4.0)
MCH: 33.1 pg (ref 26.0–34.0)
MCHC: 34.2 g/dL (ref 30.0–36.0)
MCV: 96.8 fL (ref 78.0–100.0)
Monocytes Absolute: 0.8 10*3/uL (ref 0.1–1.0)
Monocytes Relative: 11 % (ref 3–12)
NEUTROS ABS: 4.2 10*3/uL (ref 1.7–7.7)
NEUTROS PCT: 60 % (ref 43–77)
PLATELETS: 215 10*3/uL (ref 150–400)
RBC: 4.71 MIL/uL (ref 4.22–5.81)
RDW: 12.4 % (ref 11.5–15.5)
WBC: 7 10*3/uL (ref 4.0–10.5)

## 2014-01-04 LAB — HEPATITIS B CORE ANTIBODY, IGM: HEP B C IGM: NONREACTIVE

## 2014-01-04 LAB — HEPATITIS B SURFACE ANTIBODY,QUALITATIVE: HEP B S AB: NEGATIVE

## 2014-01-04 LAB — LACTATE DEHYDROGENASE: LDH: 187 U/L (ref 94–250)

## 2014-01-04 LAB — URIC ACID: Uric Acid, Serum: 7.8 mg/dL (ref 4.0–7.8)

## 2014-01-04 LAB — BONE MARROW EXAM

## 2014-01-04 MED ORDER — MIDAZOLAM HCL 2 MG/2ML IJ SOLN
INTRAMUSCULAR | Status: AC | PRN
  Administered 2014-01-04 (×2): 5 mg via INTRAVENOUS

## 2014-01-04 MED ORDER — FENTANYL CITRATE 0.05 MG/ML IJ SOLN
100.0000 ug | Freq: Once | INTRAMUSCULAR | Status: DC
  Filled 2014-01-04: qty 2

## 2014-01-04 MED ORDER — FENTANYL CITRATE 0.05 MG/ML IJ SOLN
INTRAMUSCULAR | Status: AC | PRN
  Administered 2014-01-04: 50 ug via INTRAVENOUS
  Administered 2014-01-04 (×2): 25 ug via INTRAVENOUS

## 2014-01-04 MED ORDER — SODIUM CHLORIDE 0.9 % IV SOLN
INTRAVENOUS | Status: DC
  Administered 2014-01-04: 07:00:00 via INTRAVENOUS

## 2014-01-04 MED ORDER — MIDAZOLAM HCL 10 MG/2ML IJ SOLN
10.0000 mg | Freq: Once | INTRAMUSCULAR | Status: DC
  Filled 2014-01-04: qty 2

## 2014-01-04 NOTE — Procedures (Signed)
Brief examination was performed. ENT: adequate airway clearance Heart: regular rate and rhythm.No Murmurs Lungs: clear to auscultation, no wheezes, normal respiratory effort  American Society of Anesthesiologists ASA scale 1  Mallampati Score of 4  Bone Marrow Biopsy and Aspiration Procedure Note   Informed consent was obtained and potential risks including bleeding, infection and pain were reviewed with the patient. I verified that the patient has been fasting since midnight.  The patient's name, date of birth, identification, consent and allergies were verified prior to the start of procedure and time out was performed.  A total of 10 mg of IV Versed and 100 mcg of IV fentanyl were given.  The right posterior iliac crest was chosen as the site of biopsy.  The skin was prepped with Betadine solution.   8 cc of 1% lidocaine was used to provide local anaesthesia.   10 cc of bone marrow aspirate was obtained followed by 1 inch biopsy.   The procedure was tolerated well and there were no complications.  The patient was stable at the end of the procedure.  Specimens sent for flow cytometry, cytogenetics and additional studies.

## 2014-01-04 NOTE — H&P (Signed)
  Jared Bentley 01/04/2014 11:23 AM Location: [facility] Patient #: [id number] DOB: Aug 02, 1955 Married / Language: Jared Bentley / Race: White Male  History of Present Illness Jared Klein MD; 01/04/2014 12:04 PM) Patient words: post-op hernia.  The patient is a 58 year old male who presents with lymphadenopathy. Previous history [Pt is referred by Dr. Wolfgang Phoenix for consultation regarding an enlarged left inguinal node. He noticed this around 2 month(s) ago. Symptoms include palpable lump and visible lump, while symptoms do not include fever or weight loss. The lymphadenopathy is located in the left inguinal region. The nodes are described as firm. The patient describes this as growing larger. Associated symptoms do not include abdominal mass, neck mass, scrotal swelling, lethargy, night sweats or edema. Pertinent medical history does not include cellulitis, leukemia, lymphoma or intra-abdominal neoplasm. Dr. Wolfgang Phoenix ordered a CT abd/pelvis on him and he has LAD all along the iliac chain.  He has a family history of leukemia in his father and tongue cancer in his mother. ] Pt is around 2-3 weeks s/p left inguinal LN bx. He did have follicular lymphoma, high grade, on pathology. He has seen Dr. Alvy Bimler. He had bone marrow biopsy this AM. He is scheduled for PET tomorrow. He is going to get R-CHOP chemo and is set up for that next week. He is doing well overall. He has not had to take too many pain pills. He has some swelling, but that is improving.    Allergies (Sonya Bynum, CMA; 01/04/2014 11:24 AM) No Known Drug Allergies11/16/2015  Medication History (Sonya Bynum, CMA; 01/04/2014 11:24 AM) Enalapril Maleate (20MG Tablet, Oral) Active.  Review of Systems Jared Klein MD; 01/04/2014 12:04 PM) All other systems negative   Vitals (Sonya Bynum CMA; 01/04/2014 11:24 AM) 01/04/2014 11:24 AM Weight: 269 lb Height: 72in Body Surface Area: 2.49 m Body Mass Index: 36.48  kg/m Temp.: 70F(Temporal)  Pulse: 68 (Regular)  BP: 124/74 (Sitting, Left Arm, Standard)    Physical Exam Jared Klein MD; 01/04/2014 12:05 PM) General Mental Status-Alert. General Appearance-Consistent with stated age. Hydration-Well hydrated. Voice-Normal.  Male Genitourinary Note: left groin incision with small hematoma. no evidence of infection.     Assessment & Plan Jared Klein MD; 08/81/1031 59:45 PM) FOLLICULAR LYMPHOMA GRADE III (202.00  C82.20) Impression: Pt all set for treatment.  I will place port wednesday. Discussed risks and benefits. Reviewed risk of PTX, malfunction, malposition, infection, possible need for additional procedures, etc. Reviewed post op recovery. Current Plans  Pt Education - ccs port insertion education   Signed by Jared Klein, MD (01/04/2014 12:06 PM)

## 2014-01-04 NOTE — Progress Notes (Signed)
Pt was here 11/15 node bx-labs and ekg good-will use cpap post op

## 2014-01-04 NOTE — Discharge Instructions (Signed)

## 2014-01-05 ENCOUNTER — Ambulatory Visit (HOSPITAL_COMMUNITY)
Admission: RE | Admit: 2014-01-05 | Discharge: 2014-01-05 | Disposition: A | Discharge status: Home / Self Care | Payer: BC Managed Care – PPO | Source: Ambulatory Visit | Attending: Hematology and Oncology | Admitting: Hematology and Oncology

## 2014-01-05 ENCOUNTER — Encounter: Payer: BC Managed Care – PPO | Admitting: *Deleted

## 2014-01-05 ENCOUNTER — Other Ambulatory Visit: Payer: Self-pay | Admitting: *Deleted

## 2014-01-05 ENCOUNTER — Encounter (HOSPITAL_COMMUNITY): Payer: Self-pay

## 2014-01-05 ENCOUNTER — Other Ambulatory Visit: Payer: BC Managed Care – PPO

## 2014-01-05 DIAGNOSIS — C8330 Diffuse large B-cell lymphoma, unspecified site: Secondary | ICD-10-CM | POA: Insufficient documentation

## 2014-01-05 DIAGNOSIS — Z23 Encounter for immunization: Secondary | ICD-10-CM

## 2014-01-05 LAB — GLUCOSE, CAPILLARY: Glucose-Capillary: 83 mg/dL (ref 70–99)

## 2014-01-05 MED ORDER — FLUDEOXYGLUCOSE F - 18 (FDG) INJECTION
13.4000 | Freq: Once | INTRAVENOUS | Status: AC | PRN
  Administered 2014-01-05: 13.4 via INTRAVENOUS

## 2014-01-06 ENCOUNTER — Ambulatory Visit (HOSPITAL_COMMUNITY): Payer: BC Managed Care – PPO

## 2014-01-06 ENCOUNTER — Ambulatory Visit (HOSPITAL_BASED_OUTPATIENT_CLINIC_OR_DEPARTMENT_OTHER): Payer: BC Managed Care – PPO | Admitting: Anesthesiology

## 2014-01-06 ENCOUNTER — Ambulatory Visit (HOSPITAL_BASED_OUTPATIENT_CLINIC_OR_DEPARTMENT_OTHER)
Admission: RE | Admit: 2014-01-06 | Discharge: 2014-01-06 | Disposition: A | Discharge status: Home / Self Care | Payer: BC Managed Care – PPO | Source: Ambulatory Visit | Attending: General Surgery | Admitting: General Surgery

## 2014-01-06 ENCOUNTER — Encounter (HOSPITAL_BASED_OUTPATIENT_CLINIC_OR_DEPARTMENT_OTHER)
Admission: RE | Disposition: A | Discharge status: Home / Self Care | Payer: Self-pay | Source: Ambulatory Visit | Attending: General Surgery

## 2014-01-06 ENCOUNTER — Encounter (HOSPITAL_BASED_OUTPATIENT_CLINIC_OR_DEPARTMENT_OTHER): Payer: Self-pay | Admitting: General Surgery

## 2014-01-06 DIAGNOSIS — Z95828 Presence of other vascular implants and grafts: Secondary | ICD-10-CM

## 2014-01-06 DIAGNOSIS — C8290 Follicular lymphoma, unspecified, unspecified site: Secondary | ICD-10-CM | POA: Diagnosis not present

## 2014-01-06 DIAGNOSIS — Z87891 Personal history of nicotine dependence: Secondary | ICD-10-CM | POA: Insufficient documentation

## 2014-01-06 DIAGNOSIS — G4730 Sleep apnea, unspecified: Secondary | ICD-10-CM | POA: Insufficient documentation

## 2014-01-06 DIAGNOSIS — I10 Essential (primary) hypertension: Secondary | ICD-10-CM | POA: Diagnosis not present

## 2014-01-06 DIAGNOSIS — F419 Anxiety disorder, unspecified: Secondary | ICD-10-CM | POA: Diagnosis not present

## 2014-01-06 HISTORY — PX: PORTACATH PLACEMENT: SHX2246

## 2014-01-06 SURGERY — INSERTION, TUNNELED CENTRAL VENOUS DEVICE, WITH PORT
Anesthesia: General | Site: Chest

## 2014-01-06 MED ORDER — SODIUM CHLORIDE 0.9 % IJ SOLN: 3.0000 mL | Freq: Two times a day (BID) | INTRAMUSCULAR | Status: DC

## 2014-01-06 MED ORDER — SODIUM CHLORIDE 0.9 % IJ SOLN: 3.0000 mL | INTRAMUSCULAR | Status: DC | PRN

## 2014-01-06 MED ORDER — ACETAMINOPHEN 325 MG PO TABS: 650.0000 mg | ORAL_TABLET | ORAL | Status: DC | PRN

## 2014-01-06 MED ORDER — MIDAZOLAM HCL 2 MG/2ML IJ SOLN
INTRAMUSCULAR | Status: AC
  Filled 2014-01-06: qty 2

## 2014-01-06 MED ORDER — HEPARIN SOD (PORK) LOCK FLUSH 100 UNIT/ML IV SOLN
INTRAVENOUS | Status: DC | PRN
  Administered 2014-01-06: 500 [IU] via INTRAVENOUS

## 2014-01-06 MED ORDER — FENTANYL CITRATE 0.05 MG/ML IJ SOLN
INTRAMUSCULAR | Status: AC
  Filled 2014-01-06: qty 4

## 2014-01-06 MED ORDER — OXYCODONE HCL 5 MG PO TABS
5.0000 mg | ORAL_TABLET | Freq: Once | ORAL | Status: AC | PRN
  Administered 2014-01-06: 5 mg via ORAL

## 2014-01-06 MED ORDER — ACETAMINOPHEN 650 MG RE SUPP: 650.0000 mg | RECTAL | Status: DC | PRN

## 2014-01-06 MED ORDER — LIDOCAINE HCL (CARDIAC) 20 MG/ML IV SOLN
INTRAVENOUS | Status: DC | PRN
  Administered 2014-01-06: 60 mg via INTRAVENOUS

## 2014-01-06 MED ORDER — FENTANYL CITRATE 0.05 MG/ML IJ SOLN
INTRAMUSCULAR | Status: DC | PRN
  Administered 2014-01-06: 50 ug via INTRAVENOUS
  Administered 2014-01-06: 100 ug via INTRAVENOUS

## 2014-01-06 MED ORDER — SODIUM CHLORIDE 0.9 % IV SOLN: 250.0000 mL | INTRAVENOUS | Status: DC | PRN

## 2014-01-06 MED ORDER — PROPOFOL 10 MG/ML IV BOLUS
INTRAVENOUS | Status: AC
  Filled 2014-01-06: qty 20

## 2014-01-06 MED ORDER — OXYCODONE-ACETAMINOPHEN 5-325 MG PO TABS: 1.0000 | ORAL_TABLET | ORAL | Status: DC | PRN

## 2014-01-06 MED ORDER — CEFAZOLIN SODIUM-DEXTROSE 2-3 GM-% IV SOLR
INTRAVENOUS | Status: AC
  Filled 2014-01-06: qty 50

## 2014-01-06 MED ORDER — ONDANSETRON HCL 4 MG/2ML IJ SOLN
INTRAMUSCULAR | Status: DC | PRN
  Administered 2014-01-06: 4 mg via INTRAVENOUS

## 2014-01-06 MED ORDER — DEXAMETHASONE SODIUM PHOSPHATE 4 MG/ML IJ SOLN
INTRAMUSCULAR | Status: DC | PRN
  Administered 2014-01-06: 10 mg via INTRAVENOUS

## 2014-01-06 MED ORDER — OXYCODONE HCL 5 MG PO TABS
ORAL_TABLET | ORAL | Status: AC
  Filled 2014-01-06: qty 1

## 2014-01-06 MED ORDER — FENTANYL CITRATE 0.05 MG/ML IJ SOLN: 25.0000 ug | INTRAMUSCULAR | Status: DC | PRN

## 2014-01-06 MED ORDER — LIDOCAINE-EPINEPHRINE (PF) 1 %-1:200000 IJ SOLN
INTRAMUSCULAR | Status: AC
  Filled 2014-01-06: qty 10

## 2014-01-06 MED ORDER — MIDAZOLAM HCL 5 MG/5ML IJ SOLN
INTRAMUSCULAR | Status: DC | PRN
  Administered 2014-01-06: 2 mg via INTRAVENOUS

## 2014-01-06 MED ORDER — BUPIVACAINE HCL (PF) 0.25 % IJ SOLN
INTRAMUSCULAR | Status: AC
  Filled 2014-01-06: qty 30

## 2014-01-06 MED ORDER — OXYCODONE HCL 5 MG PO TABS: 5.0000 mg | ORAL_TABLET | ORAL | Status: DC | PRN

## 2014-01-06 MED ORDER — CEFAZOLIN SODIUM-DEXTROSE 2-3 GM-% IV SOLR
2.0000 g | INTRAVENOUS | Status: AC
  Administered 2014-01-06: 3 g via INTRAVENOUS

## 2014-01-06 MED ORDER — ONDANSETRON HCL 4 MG/2ML IJ SOLN: 4.0000 mg | Freq: Four times a day (QID) | INTRAMUSCULAR | Status: DC | PRN

## 2014-01-06 MED ORDER — MIDAZOLAM HCL 2 MG/2ML IJ SOLN: 1.0000 mg | INTRAMUSCULAR | Status: DC | PRN

## 2014-01-06 MED ORDER — FENTANYL CITRATE 0.05 MG/ML IJ SOLN: 50.0000 ug | INTRAMUSCULAR | Status: DC | PRN

## 2014-01-06 MED ORDER — BUPIVACAINE-EPINEPHRINE 0.5% -1:200000 IJ SOLN
INTRAMUSCULAR | Status: DC | PRN
  Administered 2014-01-06: 10 mL

## 2014-01-06 MED ORDER — OXYCODONE HCL 5 MG/5ML PO SOLN: 5.0000 mg | Freq: Once | ORAL | Status: AC | PRN

## 2014-01-06 MED ORDER — HEPARIN (PORCINE) IN NACL 2-0.9 UNIT/ML-% IJ SOLN
INTRAMUSCULAR | Status: DC | PRN
  Administered 2014-01-06: 1 via INTRAVENOUS

## 2014-01-06 MED ORDER — PROPOFOL 10 MG/ML IV BOLUS
INTRAVENOUS | Status: DC | PRN
  Administered 2014-01-06: 200 mg via INTRAVENOUS
  Administered 2014-01-06: 50 mg via INTRAVENOUS

## 2014-01-06 MED ORDER — HEPARIN (PORCINE) IN NACL 2-0.9 UNIT/ML-% IJ SOLN
INTRAMUSCULAR | Status: AC
  Filled 2014-01-06: qty 500

## 2014-01-06 MED ORDER — HEPARIN SOD (PORK) LOCK FLUSH 100 UNIT/ML IV SOLN
INTRAVENOUS | Status: AC
  Filled 2014-01-06: qty 5

## 2014-01-06 MED ORDER — LACTATED RINGERS IV SOLN
INTRAVENOUS | Status: DC
  Administered 2014-01-06: 11:00:00 via INTRAVENOUS

## 2014-01-06 SURGICAL SUPPLY — 39 items
BAG DECANTER FOR FLEXI CONT (MISCELLANEOUS) ×3 IMPLANT
BLADE HEX COATED 2.75 (ELECTRODE) ×3 IMPLANT
BLADE SURG 11 STRL SS (BLADE) ×3 IMPLANT
BLADE SURG 15 STRL LF DISP TIS (BLADE) ×1 IMPLANT
BLADE SURG 15 STRL SS (BLADE) ×3
CHLORAPREP W/TINT 26ML (MISCELLANEOUS) ×3 IMPLANT
COVER BACK TABLE 60X90IN (DRAPES) ×3 IMPLANT
COVER MAYO STAND STRL (DRAPES) ×3 IMPLANT
DECANTER SPIKE VIAL GLASS SM (MISCELLANEOUS) IMPLANT
DRAPE C-ARM 42X72 X-RAY (DRAPES) ×3 IMPLANT
DRAPE LAPAROTOMY TRNSV 102X78 (DRAPE) ×3 IMPLANT
DRAPE UTILITY XL STRL (DRAPES) ×3 IMPLANT
DRSG TEGADERM 4X4.75 (GAUZE/BANDAGES/DRESSINGS) IMPLANT
ELECT REM PT RETURN 9FT ADLT (ELECTROSURGICAL) ×3
ELECTRODE REM PT RTRN 9FT ADLT (ELECTROSURGICAL) ×1 IMPLANT
GLOVE BIO SURGEON STRL SZ 6 (GLOVE) ×3 IMPLANT
GLOVE BIOGEL PI IND STRL 6.5 (GLOVE) ×1 IMPLANT
GLOVE BIOGEL PI INDICATOR 6.5 (GLOVE) ×2
GOWN STRL REUS W/ TWL LRG LVL3 (GOWN DISPOSABLE) ×1 IMPLANT
GOWN STRL REUS W/TWL 2XL LVL3 (GOWN DISPOSABLE) ×3 IMPLANT
GOWN STRL REUS W/TWL LRG LVL3 (GOWN DISPOSABLE) ×3
KIT PORT POWER 8FR ISP CVUE (Catheter) ×2 IMPLANT
LIQUID BAND (GAUZE/BANDAGES/DRESSINGS) ×3 IMPLANT
NDL HYPO 25X1 1.5 SAFETY (NEEDLE) ×1 IMPLANT
NEEDLE HYPO 25X1 1.5 SAFETY (NEEDLE) ×3 IMPLANT
PACK BASIN DAY SURGERY FS (CUSTOM PROCEDURE TRAY) ×3 IMPLANT
PENCIL BUTTON HOLSTER BLD 10FT (ELECTRODE) ×3 IMPLANT
SLEEVE SCD COMPRESS KNEE MED (MISCELLANEOUS) ×3 IMPLANT
SPONGE GAUZE 4X4 12PLY STER LF (GAUZE/BANDAGES/DRESSINGS) IMPLANT
SUT MNCRL AB 4-0 PS2 18 (SUTURE) ×3 IMPLANT
SUT PROLENE 2 0 SH DA (SUTURE) ×6 IMPLANT
SUT VIC AB 3-0 SH 27 (SUTURE) ×3
SUT VIC AB 3-0 SH 27X BRD (SUTURE) ×1 IMPLANT
SUT VICRYL 3-0 CR8 SH (SUTURE) IMPLANT
SYR 5ML LUER SLIP (SYRINGE) ×3 IMPLANT
SYR CONTROL 10ML LL (SYRINGE) ×3 IMPLANT
SYRINGE 10CC LL (SYRINGE) ×3 IMPLANT
TOWEL OR 17X24 6PK STRL BLUE (TOWEL DISPOSABLE) ×3 IMPLANT
TOWEL OR NON WOVEN STRL DISP B (DISPOSABLE) ×3 IMPLANT

## 2014-01-06 NOTE — Transfer of Care (Signed)
Immediate Anesthesia Transfer of Care Note  Patient: Jared Bentley  Procedure(s) Performed: Procedure(s): INSERTION PORT-A-CATH (N/A)  Patient Location: PACU  Anesthesia Type:General  Level of Consciousness: awake, alert  and oriented  Airway & Oxygen Therapy: Patient Spontanous Breathing and Patient connected to face mask oxygen  Post-op Assessment: Report given to PACU RN and Post -op Vital signs reviewed and stable  Post vital signs: Reviewed and stable  Complications: No apparent anesthesia complications

## 2014-01-06 NOTE — Anesthesia Postprocedure Evaluation (Signed)
Anesthesia Post Note  Patient: Jared Bentley  Procedure(s) Performed: Procedure(s) (LRB): INSERTION PORT-A-CATH (N/A)  Anesthesia type: General  Patient location: PACU  Post pain: Pain level controlled and Adequate analgesia  Post assessment: Post-op Vital signs reviewed, Patient's Cardiovascular Status Stable, Respiratory Function Stable, Patent Airway and Pain level controlled  Last Vitals:  Filed Vitals:   01/06/14 1245  BP: 106/72  Pulse: 63  Temp:   Resp: 15    Post vital signs: Reviewed and stable  Level of consciousness: awake, alert  and oriented  Complications: No apparent anesthesia complications

## 2014-01-06 NOTE — Discharge Instructions (Addendum)
[facility] Office Phone Number [phone]   POST OP INSTRUCTIONS  Always review your discharge instruction sheet given to you by the facility where your surgery was performed.  IF YOU HAVE DISABILITY OR FAMILY LEAVE FORMS, YOU MUST BRING THEM TO THE OFFICE FOR PROCESSING.  DO NOT GIVE THEM TO YOUR DOCTOR.  1. A prescription for pain medication may be given to you upon discharge.  Take your pain medication as prescribed, if needed.  If narcotic pain medicine is not needed, then you may take acetaminophen (Tylenol) or ibuprofen (Advil) as needed. 2. Take your usually prescribed medications unless otherwise directed 3. If you need a refill on your pain medication, please contact your pharmacy.  They will contact our office to request authorization.  Prescriptions will not be filled after 5pm or on week-ends. 4. You should eat very light the first 24 hours after surgery, such as soup, crackers, pudding, etc.  Resume your normal diet the day after surgery 5. It is common to experience some constipation if taking pain medication after surgery.  Increasing fluid intake and taking a stool softener will usually help or prevent this problem from occurring.  A mild laxative (Milk of Magnesia or Miralax) should be taken according to package directions if there are no bowel movements after 48 hours. 6. You may shower in 48 hours.  The surgical glue will flake off in 2-3 weeks.   7. ACTIVITIES:  No strenuous activity or heavy lifting for 1 week.   a. You may drive when you no longer are taking prescription pain medication, you can comfortably wear a seatbelt, and you can safely maneuver your car and apply brakes. b. RETURN TO WORK:  __________as tolerated as long as restrictions are followed if OK with Dr. Alvy Bimler.  May need to wait until chemo is over.  _______________ Dennis Bast should see your doctor in the office for a follow-up appointment approximately three-four weeks after your surgery.     WHEN TO CALL YOUR DOCTOR: 1. Fever over 101.0 2. Nausea and/or vomiting. 3. Extreme swelling or bruising. 4. Continued bleeding from incision. 5. Increased pain, redness, or drainage from the incision.  The clinic staff is available to answer your questions during regular business hours.  Please dont hesitate to call and ask to speak to one of the nurses for clinical concerns.  If you have a medical emergency, go to the nearest emergency room or call 911.  A surgeon from [facility] is always on call at the hospital.  For further questions, please visit [email or web address]   Post Anesthesia Home Care Instructions  Activity: Get plenty of rest for the remainder of the day. A responsible adult should stay with you for 24 hours following the procedure.  For the next 24 hours, DO NOT: -Drive a car -Paediatric nurse -Drink alcoholic beverages -Take any medication unless instructed by your physician -Make any legal decisions or sign important papers.  Meals: Start with liquid foods such as gelatin or soup. Progress to regular foods as tolerated. Avoid greasy, spicy, heavy foods. If nausea and/or vomiting occur, drink only clear liquids until the nausea and/or vomiting subsides. Call your physician if vomiting continues.  Special Instructions/Symptoms: Your throat may feel dry or sore from the anesthesia or the breathing tube placed in your throat during surgery. If this causes discomfort, gargle with warm salt water. The discomfort should disappear within 24 hours.   Call your surgeon if you experience:   1.  Fever over 101.0.  2.  Inability to urinate. 3.  Nausea and/or vomiting. 4.  Extreme swelling or bruising at the surgical site. 5.  Continued bleeding from the incision. 6.  Increased pain, redness or drainage from the incision. 7.  Problems related to your pain medication. 8. Any change in color, movement and/or sensation 9. Any problems and/or  concerns

## 2014-01-06 NOTE — Op Note (Signed)
PREOPERATIVE DIAGNOSIS:  lymphoma     POSTOPERATIVE DIAGNOSIS:  Same     PROCEDURE: Left subclavian port placement, Bard ClearVue  Power Port, MRI safe, 8-French.      SURGEON:  [name], MD      ANESTHESIA:  General   FINDINGS:  Good venous return, easy flush, and tip of the catheter and   SVC 26.5 cm.      SPECIMEN:  None.      ESTIMATED BLOOD LOSS:  Minimal.      COMPLICATIONS:  None known.      PROCEDURE:  Pt was identified in the holding area and taken to   the operating room, where patient was placed supine on the operating room   table.  General anesthesia was induced.  Patient's arms were tucked and the upper   chest and neck were prepped and draped in sterile fashion.  Time-out was   performed according to the surgical safety check list.  When all was   correct, we continued.   Local anesthetic was administered over this   area at the angle of the clavicle.  The vein was accessed with 1 pass of the needle. There was good venous return and the wire passed easily with no ectopy.   Fluoroscopy was used to confirm that the wire was in the vena cava.      The patient was placed back level and the area for the pocket was anethetized   with local anesthetic.  A 3-cm transverse incision was made with a #15   blade.  Cautery was used to divide the subcutaneous tissues down to the   pectoralis muscle.  An [employer] retractor was used to elevate the skin   while a pocket was created on top of the pectoralis fascia.  The port   was placed into the pocket to confirm that it was of adequate size.  The   catheter was preattached to the port.  The port was then secured to the   pectoralis fascia with four 2-0 Prolene sutures.  These were clamped and   not tied down yet.    The catheter was tunneled through to the wire exit   site.  The catheter was placed along the wire to determine what length it should be to be in the SVC.  The catheter was cut at 26.5 cm.  The tunneler sheath  and dilator were passed over the wire and the dilator and wire were removed.  The catheter was advanced through the tunneler sheath and the tunneler sheath was pulled away.  Care was taken to keep the catheter in the tunneler sheath as this occurred. This was advanced and the tunneler sheath was removed.  There was good venous   return and easy flush of the catheter.  The Prolene sutures were tied   down to the pectoral fascia.  The skin was reapproximated using 3-0   Vicryl interrupted deep dermal sutures.    Fluoroscopy was used to re-confirm good position of the catheter.  The skin   was then closed using 4-0 Monocryl in a subcuticular fashion.  The port was flushed with concentrated heparin flush as well.  The wounds were then cleaned, dried, and dressed with Dermabond.  The patient was awakened from anesthesia and taken to the PACU in stable condition.  Needle, sponge, and instrument counts were correct.               [name], MD

## 2014-01-06 NOTE — Anesthesia Preprocedure Evaluation (Signed)
Anesthesia Evaluation  Patient identified by MRN, date of birth, ID band Patient awake    Reviewed: Allergy & Precautions, H&P , NPO status , Patient's Chart, lab work & pertinent test results  Airway Mallampati: II   Neck ROM: full    Dental   Pulmonary sleep apnea , former smoker,          Cardiovascular hypertension,     Neuro/Psych Anxiety  Neuromuscular disease    GI/Hepatic   Endo/Other  obese  Renal/GU      Musculoskeletal   Abdominal   Peds  Hematology   Anesthesia Other Findings   Reproductive/Obstetrics                             Anesthesia Physical Anesthesia Plan  ASA: II  Anesthesia Plan: General   Post-op Pain Management:    Induction: Intravenous  Airway Management Planned: LMA  Additional Equipment:   Intra-op Plan:   Post-operative Plan:   Informed Consent: I have reviewed the patients History and Physical, chart, labs and discussed the procedure including the risks, benefits and alternatives for the proposed anesthesia with the patient or authorized representative who has indicated his/her understanding and acceptance.     Plan Discussed with: CRNA, Anesthesiologist and Surgeon  Anesthesia Plan Comments:         Anesthesia Quick Evaluation

## 2014-01-06 NOTE — Interval H&P Note (Signed)
History and Physical Interval Note:  01/06/2014 10:28 AM  [place]  has presented today for surgery, with the diagnosis of lymphoma  The various methods of treatment have been discussed with the patient and family. After consideration of risks, benefits and other options for treatment, the patient has consented to  Procedure(s): INSERTION PORT-A-CATH (N/A) as a surgical intervention .  The patient's history has been reviewed, patient examined, no change in status, stable for surgery.  I have reviewed the patient's chart and labs.  Questions were answered to the patient's satisfaction.     Nathaneil Feagans

## 2014-01-06 NOTE — H&P (Signed)
  Jared Bentley 01/04/2014 3:44 PM Location: [facility] Patient #: [id number] DOB: 1955-08-13 Married / Language: [name] / Race: White Male  History of Present Illness [name] MD; 01/04/2014 3:44 PM) The patient is a 58 year old male who presents with lymphadenopathy. Previous history [Pt is referred by Dr. Wolfgang Phoenix for consultation regarding an enlarged left inguinal node. He noticed this around 2 month(s) ago. Symptoms include palpable lump and visible lump, while symptoms do not include fever or weight loss. The lymphadenopathy is located in the left inguinal region. The nodes are described as firm. The patient describes this as growing larger. Associated symptoms do not include abdominal mass, neck mass, scrotal swelling, lethargy, night sweats or edema. Pertinent medical history does not include cellulitis, leukemia, lymphoma or intra-abdominal neoplasm. Dr. Wolfgang Phoenix ordered a CT abd/pelvis on him and he has LAD all along the iliac chain.   He has a family history of leukemia in his father and tongue cancer in his mother. ]  Pt is around 2-3 weeks s/p left inguinal LN bx. He did have follicular lymphoma, high grade, on pathology. He has seen Dr. Alvy Bimler. He had bone marrow biopsy this AM. He is scheduled for PET tomorrow. He is going to get R-CHOP chemo and is set up for that next week. He is doing well overall. He has not had to take too many pain pills. He has some swelling, but that is improving.    Review of Systems [name] MD; 01/04/2014 3:44 PM) All other systems negative   Physical Exam [name] MD; 01/04/2014 3:45 PM) General Mental Status-Alert. General Appearance-Consistent with stated age. Hydration-Well hydrated. Voice-Normal.  Eye Sclera/Conjunctiva - Bilateral-No scleral icterus.  Chest and Lung Exam Chest and lung exam reveals -quiet, even and easy respiratory effort with no use of accessory  muscles. Inspection Chest Wall - Normal. Back - normal.  Male Genitourinary Note: small amount of swelling in groin with healing ridge of tissue.     Assessment & Plan [name] MD; 17/00/1749 4:49 PM) FOLLICULAR LYMPHOMA GRADE III (202.00  C82.20) Impression: Pt all set for treatment.  I will place port wednesday. Discussed risks and benefits. Reviewed risk of PTX, malfunction, malposition, infection, possible need for additional procedures, etc. Reviewed post op recovery.     Signed by [name], MD (01/04/2014 3:47 PM)

## 2014-01-07 ENCOUNTER — Other Ambulatory Visit: Payer: Self-pay | Admitting: *Deleted

## 2014-01-07 ENCOUNTER — Inpatient Hospital Stay (HOSPITAL_COMMUNITY)
Admission: RE | Admit: 2014-01-07 | Discharge: 2014-01-07 | Disposition: A | Discharge status: Home / Self Care | Payer: BC Managed Care – PPO | Source: Ambulatory Visit

## 2014-01-07 ENCOUNTER — Ambulatory Visit (HOSPITAL_COMMUNITY)
Admission: RE | Admit: 2014-01-07 | Discharge: 2014-01-07 | Disposition: A | Discharge status: Home / Self Care | Payer: BC Managed Care – PPO | Source: Ambulatory Visit | Attending: Hematology and Oncology | Admitting: Hematology and Oncology

## 2014-01-07 ENCOUNTER — Encounter: Payer: Self-pay | Admitting: *Deleted

## 2014-01-07 ENCOUNTER — Other Ambulatory Visit (HOSPITAL_COMMUNITY): Payer: Self-pay | Admitting: Radiology

## 2014-01-07 ENCOUNTER — Other Ambulatory Visit (HOSPITAL_BASED_OUTPATIENT_CLINIC_OR_DEPARTMENT_OTHER): Payer: BC Managed Care – PPO

## 2014-01-07 ENCOUNTER — Other Ambulatory Visit: Payer: BC Managed Care – PPO

## 2014-01-07 DIAGNOSIS — C8590 Non-Hodgkin lymphoma, unspecified, unspecified site: Secondary | ICD-10-CM

## 2014-01-07 DIAGNOSIS — C8330 Diffuse large B-cell lymphoma, unspecified site: Secondary | ICD-10-CM

## 2014-01-07 DIAGNOSIS — Z23 Encounter for immunization: Secondary | ICD-10-CM

## 2014-01-07 LAB — COMPREHENSIVE METABOLIC PANEL (CC13)
ALK PHOS: 54 U/L (ref 40–150)
ALT: 69 U/L — AB (ref 0–55)
AST: 31 U/L (ref 5–34)
Albumin: 3.9 g/dL (ref 3.5–5.0)
Anion Gap: 10 mEq/L (ref 3–11)
BILIRUBIN TOTAL: 0.52 mg/dL (ref 0.20–1.20)
BUN: 17.2 mg/dL (ref 7.0–26.0)
CO2: 27 meq/L (ref 22–29)
Calcium: 9.9 mg/dL (ref 8.4–10.4)
Chloride: 102 mEq/L (ref 98–109)
Creatinine: 1 mg/dL (ref 0.7–1.3)
EGFR: 82 mL/min/{1.73_m2} — ABNORMAL LOW (ref >90)
Glucose: 87 mg/dl (ref 70–140)
Potassium: 4.1 mEq/L (ref 3.5–5.1)
SODIUM: 139 meq/L (ref 136–145)
TOTAL PROTEIN: 7.1 g/dL (ref 6.4–8.3)

## 2014-01-07 LAB — CBC WITH DIFFERENTIAL/PLATELET
BASO%: 0.1 % (ref 0.0–2.0)
Basophils Absolute: 0 10*3/uL (ref 0.0–0.1)
EOS ABS: 0 10*3/uL (ref 0.0–0.5)
EOS%: 0 % (ref 0.0–7.0)
HCT: 46 % (ref 38.4–49.9)
HGB: 15.9 g/dL (ref 13.0–17.1)
LYMPH%: 10.5 % — ABNORMAL LOW (ref 14.0–49.0)
MCH: 33.5 pg — ABNORMAL HIGH (ref 27.2–33.4)
MCHC: 34.6 g/dL (ref 32.0–36.0)
MCV: 97 fL (ref 79.3–98.0)
MONO#: 1.9 10*3/uL — ABNORMAL HIGH (ref 0.1–0.9)
MONO%: 10.2 % (ref 0.0–14.0)
NEUT%: 79.2 % — ABNORMAL HIGH (ref 39.0–75.0)
NEUTROS ABS: 15.2 10*3/uL — AB (ref 1.5–6.5)
Platelets: 214 10*3/uL (ref 140–400)
RBC: 4.74 10*6/uL (ref 4.20–5.82)
RDW: 12.3 % (ref 11.0–14.6)
WBC: 19.1 10*3/uL — ABNORMAL HIGH (ref 4.0–10.3)
lymph#: 2 10*3/uL (ref 0.9–3.3)
nRBC: 0 % (ref 0–0)

## 2014-01-07 LAB — RESEARCH LABS

## 2014-01-07 LAB — TECHNOLOGIST REVIEW

## 2014-01-07 LAB — LACTATE DEHYDROGENASE (CC13): LDH: 202 U/L (ref 125–245)

## 2014-01-07 LAB — URIC ACID (CC13): URIC ACID, SERUM: 6.3 mg/dL (ref 2.6–7.4)

## 2014-01-07 NOTE — Progress Notes (Signed)
Echocardiogram 2D Echocardiogram has been performed.  Jared Bentley 01/07/2014, 10:03 AM

## 2014-01-08 ENCOUNTER — Encounter (HOSPITAL_BASED_OUTPATIENT_CLINIC_OR_DEPARTMENT_OTHER): Payer: Self-pay | Admitting: General Surgery

## 2014-01-08 ENCOUNTER — Encounter: Payer: Self-pay | Admitting: *Deleted

## 2014-01-08 ENCOUNTER — Telehealth: Payer: Self-pay | Admitting: Hematology and Oncology

## 2014-01-08 ENCOUNTER — Ambulatory Visit (HOSPITAL_BASED_OUTPATIENT_CLINIC_OR_DEPARTMENT_OTHER): Payer: BC Managed Care – PPO | Admitting: Hematology and Oncology

## 2014-01-08 VITALS — BP 120/74 | HR 65 | Temp 97.7°F | Resp 18 | Ht 72.0 in | Wt 274.9 lb

## 2014-01-08 DIAGNOSIS — R748 Abnormal levels of other serum enzymes: Secondary | ICD-10-CM

## 2014-01-08 DIAGNOSIS — C8330 Diffuse large B-cell lymphoma, unspecified site: Secondary | ICD-10-CM

## 2014-01-08 DIAGNOSIS — F1020 Alcohol dependence, uncomplicated: Secondary | ICD-10-CM

## 2014-01-08 LAB — HEPATITIS B SURFACE ANTIBODY,QUALITATIVE: Hep B S Ab: NEGATIVE

## 2014-01-08 LAB — HEPATITIS B CORE ANTIBODY, IGM: Hep B C IgM: NONREACTIVE

## 2014-01-08 MED ORDER — ALLOPURINOL 300 MG PO TABS: 300.0000 mg | ORAL_TABLET | Freq: Every day | ORAL | Status: DC

## 2014-01-08 MED ORDER — PREDNISONE 50 MG PO TABS: 100.0000 mg | ORAL_TABLET | Freq: Every day | ORAL | Status: DC

## 2014-01-08 MED ORDER — PROCHLORPERAZINE MALEATE 10 MG PO TABS: 10.0000 mg | ORAL_TABLET | Freq: Four times a day (QID) | ORAL | Status: DC | PRN

## 2014-01-08 MED ORDER — ONDANSETRON HCL 8 MG PO TABS: 8.0000 mg | ORAL_TABLET | Freq: Three times a day (TID) | ORAL | Status: DC | PRN

## 2014-01-08 MED ORDER — LIDOCAINE-PRILOCAINE 2.5-2.5 % EX CREA: TOPICAL_CREAM | CUTANEOUS | Status: DC

## 2014-01-08 MED ORDER — INV-ATORVASTATIN/PLACEBO 40 MG TABS WAKE FOREST WF 98213: 1.0000 | ORAL_TABLET | Freq: Every day | ORAL | Status: DC

## 2014-01-08 NOTE — Telephone Encounter (Signed)
Pt confirmed labs/ov per 12/04 POF, gave pt AVS... KJ

## 2014-01-08 NOTE — Progress Notes (Signed)
01/08/2014  1545  WF [id number] PREVENT) study. Patient met with Dr. Alvy Bimler who reviewed trial and instructions for taking medication.  Pharmacy dispensed study drug after verified and checked with Raul Del, Pharm. D and Doristine Johns, RN. The pt was given his study drug bottle (6 months supply) for self administration. The pt was instructed to start taking 1 pill a day with or without food on Monday, December 7th at 7 pm and than again on 78/15 at 7 am. The pt knows that he is only to take 1 pill from the bottle a day. The research nurse reviewed with the pt the most common adverse events related to atorvastatin. The pt knows that this is a blinded study, and we do not know if he is on atorvastatin or placebo. The pt was encouraged to call Dr. Alvy Bimler if he develops any new side effects after starting his study drug. He verbalized understanding on how to take his study drug and when to start his study drug. He was given a December-March medication diary, and he was told to complete it daily starting on Monday, December 7th. The pt is aware that he needs to take 2 doses of the study drug before chemo and will take at 7pm on 01/11/14 and 7 am on 08/12/13.  Patient verbalized understanding and was without questions.

## 2014-01-09 ENCOUNTER — Other Ambulatory Visit: Payer: Self-pay | Admitting: Hematology and Oncology

## 2014-01-09 DIAGNOSIS — R748 Abnormal levels of other serum enzymes: Secondary | ICD-10-CM | POA: Insufficient documentation

## 2014-01-09 NOTE — Assessment & Plan Note (Signed)
We discussed the role of chemotherapy. The intent is for cure.  We discussed some of the risks, benefits and side-effects of Rituximab,Cytoxan, Adriamycin,Vincristine and Solumedrol/Prednisone.   Some of the short term side-effects included, though not limited to, risk of fatigue, weight loss, tumor lysis syndrome, risk of allergic reactions, pancytopenia, life-threatening infections, need for transfusions of blood products, nausea, vomiting, change in bowel habits, hair loss, risk of congestive heart failure, admission to hospital for various reasons, and risks of death.   Long term side-effects are also discussed including permanent damage to nerve function, chronic fatigue, and rare secondary malignancy including bone marrow disorders.   The patient is aware that the response rates discussed earlier is not guaranteed.    After a long discussion, patient made an informed decision to proceed with the prescribed plan of care. In addition, the patient also consented with enrollment in clinical trial. We discussed extensively the rationale behind enrollment in clinical trial and he agreed to proceed.

## 2014-01-09 NOTE — Assessment & Plan Note (Signed)
The patient is recommended to quit alcohol intake because this will interact with his chemotherapy. The patient is motivated to quit once chemotherapy is started.

## 2014-01-09 NOTE — Assessment & Plan Note (Signed)
This was related to alcoholism and it is improving as the patient has cut down on his drinking.

## 2014-01-09 NOTE — Progress Notes (Signed)
[place] OFFICE PROGRESS NOTE  Patient Care Team: Mikey Kirschner, MD as PCP - General (Family Medicine)  SUMMARY OF ONCOLOGIC HISTORY:   Diffuse large B cell lymphoma   12/14/2013 Imaging CT scan of the abdomen and pelvis showed bulky left iliac chain and left inguinal adenopathy, highly worrisome for lymphoma.   12/22/2013 Surgery He underwent excisional lymph node biopsy of the left inguinal region that confirm lymphoma diagnosis. No   12/22/2013 Pathology Results Accession: [id number] pathology showed a high-grade follicular lymphoma with possibly foci of diffuse large B-cell lymphoma   01/04/2014 Bone Marrow Biopsy BM biopsy was negative   01/05/2014 Imaging PET CT scan showed lymph nodes involvement of both sides of her diaphragm   01/06/2014 Surgery He has port placed   01/07/2014 Imaging ECHO showed preserved EF of 73%, mildly dilated atrium    INTERVAL HISTORY: Please see below for problem oriented charting. He is seen to review test results and for further discussion before chemotherapy next week.  REVIEW OF SYSTEMS:   Constitutional: Denies fevers, chills or abnormal weight loss Eyes: Denies blurriness of vision Ears, nose, mouth, throat, and face: Denies mucositis or sore throat Respiratory: Denies cough, dyspnea or wheezes Cardiovascular: Denies palpitation, chest discomfort or lower extremity swelling Gastrointestinal:  Denies nausea, heartburn or change in bowel habits Skin: Denies abnormal skin rashes Lymphatics: Denies new lymphadenopathy or easy bruising Neurological:Denies numbness, tingling or new weaknesses Behavioral/Psych: Mood is stable, no new changes  All other systems were reviewed with the patient and are negative.  I have reviewed the past medical history, past surgical history, social history and family history with the patient and they are unchanged from previous note.  ALLERGIES:  has No Known Allergies.  MEDICATIONS:  Current  Outpatient Prescriptions  Medication Sig Dispense Refill  . enalapril (VASOTEC) 20 MG tablet Take 20 mg by mouth every morning.    Marland Kitchen allopurinol (ZYLOPRIM) 300 MG tablet Take 1 tablet (300 mg total) by mouth daily. 30 tablet 3  . Atorvastatin Calcium (INVESTIGATIONAL ATORVASTATIN/PLACEBO) 40 MG tablet [facility] [id number] Take 1 tablet by mouth daily. Take 2 doses (these doses must be 12 hours apart) prior to first chemotherapy treatment. Then take 1 tablet daily by mouth with or without food. 180 tablet 0  . lidocaine-prilocaine (EMLA) cream Apply to affected area once 30 g 3  . ondansetron (ZOFRAN) 8 MG tablet Take 1 tablet (8 mg total) by mouth every 8 (eight) hours as needed for nausea. 30 tablet 1  . predniSONE (DELTASONE) 50 MG tablet Take 2 tablets (100 mg total) by mouth daily with breakfast. Take on days 1-5 of chemotherapy. 8 tablet 5  . prochlorperazine (COMPAZINE) 10 MG tablet Take 1 tablet (10 mg total) by mouth every 6 (six) hours as needed (Nausea or vomiting). 30 tablet 6   No current facility-administered medications for this visit.    PHYSICAL EXAMINATION: ECOG PERFORMANCE STATUS: 0 - Asymptomatic  Filed Vitals:   01/08/14 1532  BP: 120/74  Pulse: 65  Temp: 97.7 F (36.5 C)  Resp: 18   Filed Weights   01/08/14 1532  Weight: 274 lb 14.4 oz (124.694 kg)    GENERAL:alert, no distress and comfortable SKIN: skin color, texture, turgor are normal, no rashes or significant lesions EYES: normal, Conjunctiva are pink and non-injected, sclera clear Musculoskeletal:no cyanosis of digits and no clubbing. Port site looks okay NEURO: alert & oriented x 3 with fluent speech, no focal motor/sensory deficits  LABORATORY DATA:  I have reviewed the data as listed    Component Value Date/Time   NA 139 01/07/2014 1411   NA 137 01/04/2014 0907   K 4.1 01/07/2014 1411   K 4.1 01/04/2014 0907   CL 100 01/04/2014 0907   CO2 27 01/07/2014 1411   CO2 26 01/04/2014 0907   GLUCOSE  87 01/07/2014 1411   GLUCOSE 112* 01/04/2014 0907   BUN 17.2 01/07/2014 1411   BUN 15 01/04/2014 0907   CREATININE 1.0 01/07/2014 1411   CREATININE 0.92 01/04/2014 0907   CREATININE 0.98 12/09/2013 0716   CALCIUM 9.9 01/07/2014 1411   CALCIUM 9.7 01/04/2014 0907   PROT 7.1 01/07/2014 1411   PROT 7.0 01/04/2014 0907   ALBUMIN 3.9 01/07/2014 1411   ALBUMIN 3.6 01/04/2014 0907   AST 31 01/07/2014 1411   AST 46* 01/04/2014 0907   ALT 69* 01/07/2014 1411   ALT 70* 01/04/2014 0907   ALKPHOS 54 01/07/2014 1411   ALKPHOS 51 01/04/2014 0907   BILITOT 0.52 01/07/2014 1411   BILITOT 0.5 01/04/2014 0907   GFRNONAA >90 01/04/2014 0907   GFRAA >90 01/04/2014 0907    No results found for: SPEP, UPEP  Lab Results  Component Value Date   WBC 19.1* 01/07/2014   NEUTROABS 15.2* 01/07/2014   HGB 15.9 01/07/2014   HCT 46.0 01/07/2014   MCV 97.0 01/07/2014   PLT 214 01/07/2014      Chemistry      Component Value Date/Time   NA 139 01/07/2014 1411   NA 137 01/04/2014 0907   K 4.1 01/07/2014 1411   K 4.1 01/04/2014 0907   CL 100 01/04/2014 0907   CO2 27 01/07/2014 1411   CO2 26 01/04/2014 0907   BUN 17.2 01/07/2014 1411   BUN 15 01/04/2014 0907   CREATININE 1.0 01/07/2014 1411   CREATININE 0.92 01/04/2014 0907   CREATININE 0.98 12/09/2013 0716      Component Value Date/Time   CALCIUM 9.9 01/07/2014 1411   CALCIUM 9.7 01/04/2014 0907   ALKPHOS 54 01/07/2014 1411   ALKPHOS 51 01/04/2014 0907   AST 31 01/07/2014 1411   AST 46* 01/04/2014 0907   ALT 69* 01/07/2014 1411   ALT 70* 01/04/2014 0907   BILITOT 0.52 01/07/2014 1411   BILITOT 0.5 01/04/2014 0907       RADIOGRAPHIC STUDIES: I reviewed the reports of his PET/CT scan and echocardiogram. I have personally reviewed the radiological images as listed and agreed with the findings in the report.   ASSESSMENT & PLAN:  Diffuse large B cell lymphoma We discussed the role of chemotherapy. The intent is for cure.  We  discussed some of the risks, benefits and side-effects of Rituximab,Cytoxan, Adriamycin,Vincristine and Solumedrol/Prednisone.   Some of the short term side-effects included, though not limited to, risk of fatigue, weight loss, tumor lysis syndrome, risk of allergic reactions, pancytopenia, life-threatening infections, need for transfusions of blood products, nausea, vomiting, change in bowel habits, hair loss, risk of congestive heart failure, admission to hospital for various reasons, and risks of death.   Long term side-effects are also discussed including permanent damage to nerve function, chronic fatigue, and rare secondary malignancy including bone marrow disorders.   The patient is aware that the response rates discussed earlier is not guaranteed.    After a long discussion, patient made an informed decision to proceed with the prescribed plan of care. In addition, the patient also consented with enrollment in clinical trial. We discussed extensively the rationale behind  enrollment in clinical trial and he agreed to proceed.   Chronic alcoholism The patient is recommended to quit alcohol intake because this will interact with his chemotherapy. The patient is motivated to quit once chemotherapy is started.    Elevated liver enzymes This was related to alcoholism and it is improving as the patient has cut down on his drinking.   Orders Placed This Encounter  Procedures  . CBC with Differential    Standing Status: Standing     Number of Occurrences: 20     Standing Expiration Date: 01/09/2015  . Comprehensive metabolic panel    Standing Status: Standing     Number of Occurrences: 20     Standing Expiration Date: 01/09/2015  . PHYSICIAN COMMUNICATION ORDER    A baseline Echo/ Muga should be obtained prior to initiation of Anthracycline Chemotherapy  . PHYSICIAN COMMUNICATION ORDER    Hepatitis B Virus screening with HBsAg and anti-HBc recommended prior to treatment with rituximab  (Rituxan), ofatumumab (Arzerra) or obinutuzumab Dyann Kief).   All questions were answered. The patient knows to call the clinic with any problems, questions or concerns. No barriers to learning was detected. I spent 40 minutes counseling the patient face to face. The total time spent in the appointment was 60 minutes and more than 50% was on counseling and review of test results     [facility], [place], MD 01/09/2014 7:49 PM

## 2014-01-11 LAB — CHROMOSOME ANALYSIS, BONE MARROW

## 2014-01-11 NOTE — Progress Notes (Signed)
01/08/14 1545 Please note that this RN confirmed with MD the below on 01/06/14: The patient has a greater than 2 year life expectancy  Confirmed with MD that the total dose of Adriamycin will be greater than 240 mg. Confirmed with MD that the patient is a stage III lymphoma.

## 2014-01-12 ENCOUNTER — Ambulatory Visit (HOSPITAL_BASED_OUTPATIENT_CLINIC_OR_DEPARTMENT_OTHER): Payer: BC Managed Care – PPO

## 2014-01-12 ENCOUNTER — Encounter: Payer: Self-pay | Admitting: *Deleted

## 2014-01-12 DIAGNOSIS — Z5111 Encounter for antineoplastic chemotherapy: Secondary | ICD-10-CM

## 2014-01-12 DIAGNOSIS — Z5112 Encounter for antineoplastic immunotherapy: Secondary | ICD-10-CM

## 2014-01-12 DIAGNOSIS — C8330 Diffuse large B-cell lymphoma, unspecified site: Secondary | ICD-10-CM

## 2014-01-12 MED ORDER — DOXORUBICIN HCL CHEMO IV INJECTION 2 MG/ML
50.0000 mg/m2 | Freq: Once | INTRAVENOUS | Status: AC
  Administered 2014-01-12: 124 mg via INTRAVENOUS
  Filled 2014-01-12: qty 62

## 2014-01-12 MED ORDER — DEXAMETHASONE SODIUM PHOSPHATE 20 MG/5ML IJ SOLN
20.0000 mg | Freq: Once | INTRAMUSCULAR | Status: AC
  Administered 2014-01-12: 20 mg via INTRAVENOUS

## 2014-01-12 MED ORDER — ONDANSETRON 16 MG/50ML IVPB (CHCC)
16.0000 mg | Freq: Once | INTRAVENOUS | Status: AC
  Administered 2014-01-12: 16 mg via INTRAVENOUS

## 2014-01-12 MED ORDER — ACETAMINOPHEN 325 MG PO TABS
650.0000 mg | ORAL_TABLET | Freq: Once | ORAL | Status: AC
  Administered 2014-01-12: 650 mg via ORAL

## 2014-01-12 MED ORDER — VINCRISTINE SULFATE CHEMO INJECTION 1 MG/ML
2.0000 mg | Freq: Once | INTRAVENOUS | Status: AC
  Administered 2014-01-12: 2 mg via INTRAVENOUS
  Filled 2014-01-12: qty 2

## 2014-01-12 MED ORDER — SODIUM CHLORIDE 0.9 % IV SOLN
375.0000 mg/m2 | Freq: Once | INTRAVENOUS | Status: AC
  Administered 2014-01-12: 900 mg via INTRAVENOUS
  Filled 2014-01-12: qty 90

## 2014-01-12 MED ORDER — DIPHENHYDRAMINE HCL 25 MG PO CAPS
ORAL_CAPSULE | ORAL | Status: AC
Start: 2014-01-12 — End: 2014-01-12
  Filled 2014-01-12: qty 2

## 2014-01-12 MED ORDER — ONDANSETRON 16 MG/50ML IVPB (CHCC)
INTRAVENOUS | Status: AC
  Filled 2014-01-12: qty 16

## 2014-01-12 MED ORDER — ACETAMINOPHEN 325 MG PO TABS
ORAL_TABLET | ORAL | Status: AC
  Filled 2014-01-12: qty 2

## 2014-01-12 MED ORDER — DIPHENHYDRAMINE HCL 25 MG PO CAPS
50.0000 mg | ORAL_CAPSULE | Freq: Once | ORAL | Status: AC
  Administered 2014-01-12: 50 mg via ORAL

## 2014-01-12 MED ORDER — SODIUM CHLORIDE 0.9 % IV SOLN
750.0000 mg/m2 | Freq: Once | INTRAVENOUS | Status: AC
  Administered 2014-01-12: 1840 mg via INTRAVENOUS
  Filled 2014-01-12: qty 92

## 2014-01-12 MED ORDER — SODIUM CHLORIDE 0.9 % IV SOLN
Freq: Once | INTRAVENOUS | Status: AC
  Administered 2014-01-12: 10:00:00 via INTRAVENOUS

## 2014-01-12 MED ORDER — DEXAMETHASONE SODIUM PHOSPHATE 20 MG/5ML IJ SOLN
INTRAMUSCULAR | Status: AC
  Filled 2014-01-12: qty 5

## 2014-01-12 MED ORDER — HEPARIN SOD (PORK) LOCK FLUSH 100 UNIT/ML IV SOLN
500.0000 [IU] | Freq: Once | INTRAVENOUS | Status: AC | PRN
  Administered 2014-01-12: 500 [IU]
  Filled 2014-01-12: qty 5

## 2014-01-12 MED ORDER — SODIUM CHLORIDE 0.9 % IJ SOLN
10.0000 mL | INTRAMUSCULAR | Status: DC | PRN
  Administered 2014-01-12: 10 mL
  Filled 2014-01-12: qty 10

## 2014-01-12 NOTE — Progress Notes (Signed)
01/12/2014 0945 [id number] PREVENT study  The pt was in to the cancer center this morning for his first chemotherapy treatment. Emotional support was given to the pt and his wife. The pt confirmed that he took his first dose of study drug on 01/11/14 at 1900 and second dose of study drug (atorvastatin/ placebo) on 01/12/14 at 0700. He confirmed that he is recording his daily dosages on the medication diary/calendar. The pt denies any side effects since starting the study drug. He understands to report myalgias and other potential side effects as noted in the consent form. The patient has 6 months worth of study drug and 6 months of medication diaries for record keeping. The patient verbalized comprehension and was without questions. The pt was thanked for his support of this trial.

## 2014-01-12 NOTE — Patient Instructions (Signed)
[facility] Discharge Instructions for Patients Receiving Chemotherapy  Today you received the following chemotherapy agents Adriamycin, Vincristine, Cytoxan, Rituxan  To help prevent nausea and vomiting after your treatment, we encourage you to take your nausea medication as directed/prescribed.   If you develop nausea and vomiting that is not controlled by your nausea medication, call the clinic.   BELOW ARE SYMPTOMS THAT SHOULD BE REPORTED IMMEDIATELY:  *FEVER GREATER THAN 100.5 F  *CHILLS WITH OR WITHOUT FEVER  NAUSEA AND VOMITING THAT IS NOT CONTROLLED WITH YOUR NAUSEA MEDICATION  *UNUSUAL SHORTNESS OF BREATH  *UNUSUAL BRUISING OR BLEEDING  TENDERNESS IN MOUTH AND THROAT WITH OR WITHOUT PRESENCE OF ULCERS  *URINARY PROBLEMS  *BOWEL PROBLEMS  UNUSUAL RASH Items with * indicate a potential emergency and should be followed up as soon as possible.  Feel free to call the clinic you have any questions or concerns. The clinic phone number is [phone].

## 2014-01-12 NOTE — Progress Notes (Signed)
01/08/14 1545 office visit with MD This RN asked MD if aware of echo results from 01/07/14.  Per Dr. Alvy Bimler, " I am aware of the results and the results will not prevent patient from receiving planned chemo treatment".  Continue with protocol study drug due to start on 01/11/14.

## 2014-01-13 ENCOUNTER — Ambulatory Visit (HOSPITAL_BASED_OUTPATIENT_CLINIC_OR_DEPARTMENT_OTHER): Payer: BC Managed Care – PPO

## 2014-01-13 DIAGNOSIS — C8330 Diffuse large B-cell lymphoma, unspecified site: Secondary | ICD-10-CM

## 2014-01-13 DIAGNOSIS — Z5189 Encounter for other specified aftercare: Secondary | ICD-10-CM

## 2014-01-13 MED ORDER — PEGFILGRASTIM INJECTION 6 MG/0.6ML ~~LOC~~
6.0000 mg | PREFILLED_SYRINGE | Freq: Once | SUBCUTANEOUS | Status: AC
  Administered 2014-01-13: 6 mg via SUBCUTANEOUS
  Filled 2014-01-13: qty 0.6

## 2014-01-13 NOTE — Patient Instructions (Signed)
Pegfilgrastim injection What is this medicine? PEGFILGRASTIM (peg fil GRA stim) is a long-acting granulocyte colony-stimulating factor that stimulates the growth of neutrophils, a type of white blood cell important in the body's fight against infection. It is used to reduce the incidence of fever and infection in patients with certain types of cancer who are receiving chemotherapy that affects the bone marrow. This medicine may be used for other purposes; ask your health care provider or pharmacist if you have questions. COMMON BRAND NAME(S): Neulasta What should I tell my health care provider before I take this medicine? They need to know if you have any of these conditions: -latex allergy -ongoing radiation therapy -sickle cell disease -skin reactions to acrylic adhesives (On-Body Injector only) -an unusual or allergic reaction to pegfilgrastim, filgrastim, other medicines, foods, dyes, or preservatives -pregnant or trying to get pregnant -breast-feeding How should I use this medicine? This medicine is for injection under the skin. If you get this medicine at home, you will be taught how to prepare and give the pre-filled syringe or how to use the On-body Injector. Refer to the patient Instructions for Use for detailed instructions. Use exactly as directed. Take your medicine at regular intervals. Do not take your medicine more often than directed. It is important that you put your used needles and syringes in a special sharps container. Do not put them in a trash can. If you do not have a sharps container, call your pharmacist or healthcare provider to get one. Talk to your pediatrician regarding the use of this medicine in children. Special care may be needed. Overdosage: If you think you have taken too much of this medicine contact a poison control center or emergency room at once. NOTE: This medicine is only for you. Do not share this medicine with others. What if I miss a dose? It is  important not to miss your dose. Call your doctor or health care professional if you miss your dose. If you miss a dose due to an On-body Injector failure or leakage, a new dose should be administered as soon as possible using a single prefilled syringe for manual use. What may interact with this medicine? Interactions have not been studied. Give your health care provider a list of all the medicines, herbs, non-prescription drugs, or dietary supplements you use. Also tell them if you smoke, drink alcohol, or use illegal drugs. Some items may interact with your medicine. This list may not describe all possible interactions. Give your health care provider a list of all the medicines, herbs, non-prescription drugs, or dietary supplements you use. Also tell them if you smoke, drink alcohol, or use illegal drugs. Some items may interact with your medicine. What should I watch for while using this medicine? You may need blood work done while you are taking this medicine. If you are going to need a MRI, CT scan, or other procedure, tell your doctor that you are using this medicine (On-Body Injector only). What side effects may I notice from receiving this medicine? Side effects that you should report to your doctor or health care professional as soon as possible: -allergic reactions like skin rash, itching or hives, swelling of the face, lips, or tongue -dizziness -fever -pain, redness, or irritation at site where injected -pinpoint red spots on the skin -shortness of breath or breathing problems -stomach or side pain, or pain at the shoulder -swelling -tiredness -trouble passing urine Side effects that usually do not require medical attention (report to your doctor   or health care professional if they continue or are bothersome): -bone pain -muscle pain This list may not describe all possible side effects. Call your doctor for medical advice about side effects. You may report side effects to FDA at  1-800-FDA-1088. Where should I keep my medicine? Keep out of the reach of children. Store pre-filled syringes in a refrigerator between 2 and 8 degrees C (36 and 46 degrees F). Do not freeze. Keep in carton to protect from light. Throw away this medicine if it is left out of the refrigerator for more than 48 hours. Throw away any unused medicine after the expiration date. NOTE: This sheet is a summary. It may not cover all possible information. If you have questions about this medicine, talk to your doctor, pharmacist, or health care provider.  2015, Elsevier/Gold Standard. (2013-04-23 16:14:05)  

## 2014-01-22 ENCOUNTER — Encounter (HOSPITAL_COMMUNITY): Payer: Self-pay

## 2014-01-22 ENCOUNTER — Encounter: Payer: Self-pay | Admitting: *Deleted

## 2014-02-02 ENCOUNTER — Ambulatory Visit: Payer: BC Managed Care – PPO

## 2014-02-02 ENCOUNTER — Ambulatory Visit (HOSPITAL_BASED_OUTPATIENT_CLINIC_OR_DEPARTMENT_OTHER): Payer: BC Managed Care – PPO

## 2014-02-02 ENCOUNTER — Other Ambulatory Visit: Payer: Self-pay | Admitting: *Deleted

## 2014-02-02 ENCOUNTER — Encounter: Payer: Self-pay | Admitting: Hematology and Oncology

## 2014-02-02 ENCOUNTER — Ambulatory Visit (HOSPITAL_BASED_OUTPATIENT_CLINIC_OR_DEPARTMENT_OTHER): Payer: BC Managed Care – PPO | Admitting: Hematology and Oncology

## 2014-02-02 ENCOUNTER — Encounter: Payer: Self-pay | Admitting: *Deleted

## 2014-02-02 ENCOUNTER — Other Ambulatory Visit (HOSPITAL_BASED_OUTPATIENT_CLINIC_OR_DEPARTMENT_OTHER): Payer: BC Managed Care – PPO

## 2014-02-02 ENCOUNTER — Telehealth: Payer: Self-pay | Admitting: Hematology and Oncology

## 2014-02-02 VITALS — BP 106/70 | HR 90 | Temp 98.4°F | Resp 18 | Ht 72.0 in | Wt 264.2 lb

## 2014-02-02 DIAGNOSIS — G620 Drug-induced polyneuropathy: Secondary | ICD-10-CM | POA: Insufficient documentation

## 2014-02-02 DIAGNOSIS — Z5111 Encounter for antineoplastic chemotherapy: Secondary | ICD-10-CM

## 2014-02-02 DIAGNOSIS — C8330 Diffuse large B-cell lymphoma, unspecified site: Secondary | ICD-10-CM

## 2014-02-02 DIAGNOSIS — Z95828 Presence of other vascular implants and grafts: Secondary | ICD-10-CM

## 2014-02-02 DIAGNOSIS — K1231 Oral mucositis (ulcerative) due to antineoplastic therapy: Secondary | ICD-10-CM

## 2014-02-02 DIAGNOSIS — T451X5A Adverse effect of antineoplastic and immunosuppressive drugs, initial encounter: Secondary | ICD-10-CM

## 2014-02-02 DIAGNOSIS — Z5112 Encounter for antineoplastic immunotherapy: Secondary | ICD-10-CM

## 2014-02-02 LAB — CBC WITH DIFFERENTIAL/PLATELET
BASO%: 0.6 % (ref 0.0–2.0)
Basophils Absolute: 0 10*3/uL (ref 0.0–0.1)
EOS%: 0 % (ref 0.0–7.0)
Eosinophils Absolute: 0 10*3/uL (ref 0.0–0.5)
HEMATOCRIT: 43 % (ref 38.4–49.9)
HEMOGLOBIN: 14.8 g/dL (ref 13.0–17.1)
LYMPH%: 15.6 % (ref 14.0–49.0)
MCH: 33.3 pg (ref 27.2–33.4)
MCHC: 34.4 g/dL (ref 32.0–36.0)
MCV: 96.6 fL (ref 79.3–98.0)
MONO#: 0.7 10*3/uL (ref 0.1–0.9)
MONO%: 10 % (ref 0.0–14.0)
NEUT%: 73.8 % (ref 39.0–75.0)
NEUTROS ABS: 5.3 10*3/uL (ref 1.5–6.5)
Platelets: 257 10*3/uL (ref 140–400)
RBC: 4.45 10*6/uL (ref 4.20–5.82)
RDW: 12.6 % (ref 11.0–14.6)
WBC: 7.1 10*3/uL (ref 4.0–10.3)
lymph#: 1.1 10*3/uL (ref 0.9–3.3)

## 2014-02-02 LAB — COMPREHENSIVE METABOLIC PANEL (CC13)
ALBUMIN: 3.6 g/dL (ref 3.5–5.0)
ALK PHOS: 68 U/L (ref 40–150)
ALT: 44 U/L (ref 0–55)
AST: 34 U/L (ref 5–34)
Anion Gap: 9 mEq/L (ref 3–11)
BUN: 13.3 mg/dL (ref 7.0–26.0)
CO2: 26 mEq/L (ref 22–29)
Calcium: 9.8 mg/dL (ref 8.4–10.4)
Chloride: 103 mEq/L (ref 98–109)
Creatinine: 1 mg/dL (ref 0.7–1.3)
EGFR: 79 mL/min/{1.73_m2} — ABNORMAL LOW (ref >90)
Glucose: 169 mg/dl — ABNORMAL HIGH (ref 70–140)
Potassium: 4 mEq/L (ref 3.5–5.1)
Sodium: 139 mEq/L (ref 136–145)
TOTAL PROTEIN: 6.7 g/dL (ref 6.4–8.3)
Total Bilirubin: 0.55 mg/dL (ref 0.20–1.20)

## 2014-02-02 MED ORDER — ACETAMINOPHEN 325 MG PO TABS
650.0000 mg | ORAL_TABLET | Freq: Once | ORAL | Status: AC
  Administered 2014-02-02: 650 mg via ORAL

## 2014-02-02 MED ORDER — DEXAMETHASONE SODIUM PHOSPHATE 20 MG/5ML IJ SOLN
INTRAMUSCULAR | Status: AC
  Filled 2014-02-02: qty 5

## 2014-02-02 MED ORDER — SODIUM CHLORIDE 0.9 % IJ SOLN
10.0000 mL | INTRAMUSCULAR | Status: DC | PRN
  Administered 2014-02-02: 10 mL via INTRAVENOUS
  Filled 2014-02-02: qty 10

## 2014-02-02 MED ORDER — VINCRISTINE SULFATE CHEMO INJECTION 1 MG/ML
1.0000 mg | Freq: Once | INTRAVENOUS | Status: AC
  Administered 2014-02-02: 1 mg via INTRAVENOUS
  Filled 2014-02-02: qty 1

## 2014-02-02 MED ORDER — DEXAMETHASONE SODIUM PHOSPHATE 20 MG/5ML IJ SOLN
20.0000 mg | Freq: Once | INTRAMUSCULAR | Status: AC
  Administered 2014-02-02: 20 mg via INTRAVENOUS

## 2014-02-02 MED ORDER — SODIUM CHLORIDE 0.9 % IV SOLN
Freq: Once | INTRAVENOUS | Status: AC
  Administered 2014-02-02: 10:00:00 via INTRAVENOUS

## 2014-02-02 MED ORDER — PREDNISONE 20 MG PO TABS: 60.0000 mg | ORAL_TABLET | Freq: Every day | ORAL | Status: DC

## 2014-02-02 MED ORDER — DIPHENHYDRAMINE HCL 25 MG PO CAPS
ORAL_CAPSULE | ORAL | Status: AC
  Filled 2014-02-02: qty 2

## 2014-02-02 MED ORDER — DOXORUBICIN HCL CHEMO IV INJECTION 2 MG/ML
50.0000 mg/m2 | Freq: Once | INTRAVENOUS | Status: AC
  Administered 2014-02-02: 124 mg via INTRAVENOUS
  Filled 2014-02-02: qty 62

## 2014-02-02 MED ORDER — SODIUM CHLORIDE 0.9 % IV SOLN
375.0000 mg/m2 | Freq: Once | INTRAVENOUS | Status: AC
  Administered 2014-02-02: 900 mg via INTRAVENOUS
  Filled 2014-02-02: qty 90

## 2014-02-02 MED ORDER — SODIUM CHLORIDE 0.9 % IV SOLN
750.0000 mg/m2 | Freq: Once | INTRAVENOUS | Status: AC
  Administered 2014-02-02: 1840 mg via INTRAVENOUS
  Filled 2014-02-02: qty 92

## 2014-02-02 MED ORDER — ACETAMINOPHEN 325 MG PO TABS
ORAL_TABLET | ORAL | Status: AC
  Filled 2014-02-02: qty 2

## 2014-02-02 MED ORDER — HEPARIN SOD (PORK) LOCK FLUSH 100 UNIT/ML IV SOLN
500.0000 [IU] | Freq: Once | INTRAVENOUS | Status: AC | PRN
  Administered 2014-02-02: 500 [IU]
  Filled 2014-02-02: qty 5

## 2014-02-02 MED ORDER — DIPHENHYDRAMINE HCL 25 MG PO CAPS
50.0000 mg | ORAL_CAPSULE | Freq: Once | ORAL | Status: AC
  Administered 2014-02-02: 50 mg via ORAL

## 2014-02-02 MED ORDER — SODIUM CHLORIDE 0.9 % IJ SOLN
10.0000 mL | INTRAMUSCULAR | Status: DC | PRN
  Administered 2014-02-02: 10 mL
  Filled 2014-02-02: qty 10

## 2014-02-02 MED ORDER — ONDANSETRON 16 MG/50ML IVPB (CHCC)
INTRAVENOUS | Status: AC
  Filled 2014-02-02: qty 16

## 2014-02-02 MED ORDER — ONDANSETRON 16 MG/50ML IVPB (CHCC)
16.0000 mg | Freq: Once | INTRAVENOUS | Status: AC
  Administered 2014-02-02: 16 mg via INTRAVENOUS

## 2014-02-02 NOTE — Assessment & Plan Note (Signed)
This is only mild grade 1. I recommend conservative management with baking soda mix. If this is severe with cycle 2 of treatment, I plan to reduce the dose of Adriamycin and Cytoxan.

## 2014-02-02 NOTE — Patient Instructions (Signed)

## 2014-02-02 NOTE — Telephone Encounter (Signed)
gv adn printed appt sched and avs for pt for DEC thru Feb 2016  sed added tx.

## 2014-02-02 NOTE — Assessment & Plan Note (Signed)
He is experiencing expected side effects from treatment. I will start with 50% dose adjustment of vincristine due to early signs of peripheral neuropathy after just 1 cycle of treatment. He is also not tolerating high-dose prednisone and a plan to reduce it to 60 mg instead of 100 mg. I will reassess toxicity next cycle. We will proceed with treatment today without delay

## 2014-02-02 NOTE — Progress Notes (Signed)
02/02/14 at 10:25am - The research nurse met briefly with the pt today in the infusion room.  The pt stated that he has no adverse events that he feels is related to his study drug (atorvastatin or placebo).  He stated that he cannot tell any difference since he began his study drug on 01/11/14.  The pt confirmed that he is taking his study drug daily, and he verified that he is recording his daily doses on the medication calendar.  The pt was reminded that his baseline AST and ALT values were elevated, and the study requires these labs to be repeated after 1 month of study drug administration.  The pt's AST and ALT local lab values were both within normal limits today.  The pt's 1 month labs are due around 02/11/14.  The pt's next office visit is scheduled for 02/23/14.  The research nurse discussed the pt's situation with Robin Rosdhal at [facility].   It was decided to add the pt's "1 month labs" to the pt's next office visit on 02/23/14 instead of drawing them early today.  It was felt unnecessary to have the pt make an extra trip here on 02/11/14 for his 1 month labs since his labs are normal today.   The pt was in total agreement to have these labs drawn at his next office visit.  The pt said since his labs are normal today, then he is very comfortable with waiting until his next appointment for convenience reasons.  He said that he also wants to avoid any "additional sticks".   The pt was thanked for his continued support of this clinical trial.

## 2014-02-02 NOTE — Progress Notes (Signed)
Swift blood return noted before, during and after Adriamycin. 

## 2014-02-02 NOTE — Progress Notes (Signed)
[facility] OFFICE PROGRESS NOTE  Patient Care Team: Mikey Kirschner, MD as PCP - General (Family Medicine)  SUMMARY OF ONCOLOGIC HISTORY: Oncology History   Diffuse large B cell lymphoma   Staging form: Lymphoid Neoplasms, AJCC 6th Edition     Clinical stage from 12/30/2013: Stage III - Signed by Heath Lark, MD on 01/08/2014     Pathologic: No stage assigned - Unsigned       Diffuse large B cell lymphoma   12/14/2013 Imaging CT scan of the abdomen and pelvis showed bulky left iliac chain and left inguinal adenopathy, highly worrisome for lymphoma.   12/22/2013 Surgery He underwent excisional lymph node biopsy of the left inguinal region that confirm lymphoma diagnosis. No   12/22/2013 Pathology Results Accession: [id number] pathology showed a high-grade follicular lymphoma with possibly foci of diffuse large B-cell lymphoma   01/04/2014 Bone Marrow Biopsy BM biopsy was negative with normal cytogenetics   01/05/2014 Imaging PET CT scan showed lymph nodes involvement of both sides of her diaphragm   01/06/2014 Surgery He has port placed   01/07/2014 Imaging ECHO showed preserved EF of 73%, mildly dilated atrium   01/12/2014 -  Chemotherapy He is started on R CHOP chemotherapy   02/02/2014 Adverse Reaction Vincristine dose was reduced by 50% due to early signs of peripheral neuropathy    INTERVAL HISTORY: Please see below for problem oriented charting. He is seen prior to cycle 2 of therapy. He complained of mucositis lasting for several days but resolved spontaneously. He is starting to experience early signs of peripheral neuropathy at the tips of his fingers.  REVIEW OF SYSTEMS:   Constitutional: Denies fevers, chills or abnormal weight loss Eyes: Denies blurriness of vision Respiratory: Denies cough, dyspnea or wheezes Cardiovascular: Denies palpitation, chest discomfort or lower extremity swelling Gastrointestinal:  Denies nausea, heartburn or change in bowel  habits Skin: Denies abnormal skin rashes Lymphatics: Denies new lymphadenopathy or easy bruising Behavioral/Psych: Mood is stable, no new changes  All other systems were reviewed with the patient and are negative.  I have reviewed the past medical history, past surgical history, social history and family history with the patient and they are unchanged from previous note.  ALLERGIES:  has No Known Allergies.  MEDICATIONS:  Current Outpatient Prescriptions  Medication Sig Dispense Refill  . allopurinol (ZYLOPRIM) 300 MG tablet Take 1 tablet (300 mg total) by mouth daily. 30 tablet 3  . Atorvastatin Calcium (INVESTIGATIONAL ATORVASTATIN/PLACEBO) 40 MG tablet [facility] [id number] Take 1 tablet by mouth daily. Take 2 doses (these doses must be 12 hours apart) prior to first chemotherapy treatment. Then take 1 tablet daily by mouth with or without food. 180 tablet 0  . enalapril (VASOTEC) 20 MG tablet Take 20 mg by mouth every morning.    . lidocaine-prilocaine (EMLA) cream Apply to affected area once 30 g 3  . ondansetron (ZOFRAN) 8 MG tablet Take 1 tablet (8 mg total) by mouth every 8 (eight) hours as needed for nausea. 30 tablet 1  . prochlorperazine (COMPAZINE) 10 MG tablet Take 1 tablet (10 mg total) by mouth every 6 (six) hours as needed (Nausea or vomiting). 30 tablet 6  . predniSONE (DELTASONE) 20 MG tablet Take 3 tablets (60 mg total) by mouth daily with breakfast. Take on days 2-5 of chemotherapy every 21 days 60 tablet 0   No current facility-administered medications for this visit.    PHYSICAL EXAMINATION: ECOG PERFORMANCE STATUS: 1 - Symptomatic but completely ambulatory  Filed Vitals:   02/02/14 0906  BP: 106/70  Pulse: 90  Temp: 98.4 F (36.9 C)  Resp: 18   Filed Weights   02/02/14 0906  Weight: 264 lb 3.2 oz (119.84 kg)    GENERAL:alert, no distress and comfortable. He is morbidly obese SKIN: skin color, texture, turgor are normal, no rashes or significant  lesions EYES: normal, Conjunctiva are pink and non-injected, sclera clear OROPHARYNX:no exudate, no erythema and lips, buccal mucosa, and tongue normal  NECK: supple, thyroid normal size, non-tender, without nodularity LYMPH:  no palpable lymphadenopathy in the cervical, axillary or inguinal LUNGS: clear to auscultation and percussion with normal breathing effort HEART: regular rate & rhythm and no murmurs and no lower extremity edema ABDOMEN:abdomen soft, non-tender and normal bowel sounds Musculoskeletal:no cyanosis of digits and no clubbing  NEURO: alert & oriented x 3 with fluent speech, no focal motor/sensory deficits  LABORATORY DATA:  I have reviewed the data as listed    Component Value Date/Time   NA 139 02/02/2014 0810   NA 137 01/04/2014 0907   K 4.0 02/02/2014 0810   K 4.1 01/04/2014 0907   CL 100 01/04/2014 0907   CO2 26 02/02/2014 0810   CO2 26 01/04/2014 0907   GLUCOSE 169* 02/02/2014 0810   GLUCOSE 112* 01/04/2014 0907   BUN 13.3 02/02/2014 0810   BUN 15 01/04/2014 0907   CREATININE 1.0 02/02/2014 0810   CREATININE 0.92 01/04/2014 0907   CREATININE 0.98 12/09/2013 0716   CALCIUM 9.8 02/02/2014 0810   CALCIUM 9.7 01/04/2014 0907   PROT 6.7 02/02/2014 0810   PROT 7.0 01/04/2014 0907   ALBUMIN 3.6 02/02/2014 0810   ALBUMIN 3.6 01/04/2014 0907   AST 34 02/02/2014 0810   AST 46* 01/04/2014 0907   ALT 44 02/02/2014 0810   ALT 70* 01/04/2014 0907   ALKPHOS 68 02/02/2014 0810   ALKPHOS 51 01/04/2014 0907   BILITOT 0.55 02/02/2014 0810   BILITOT 0.5 01/04/2014 0907   GFRNONAA >90 01/04/2014 0907   GFRAA >90 01/04/2014 0907    No results found for: SPEP, UPEP  Lab Results  Component Value Date   WBC 7.1 02/02/2014   NEUTROABS 5.3 02/02/2014   HGB 14.8 02/02/2014   HCT 43.0 02/02/2014   MCV 96.6 02/02/2014   PLT 257 02/02/2014      Chemistry      Component Value Date/Time   NA 139 02/02/2014 0810   NA 137 01/04/2014 0907   K 4.0 02/02/2014 0810    K 4.1 01/04/2014 0907   CL 100 01/04/2014 0907   CO2 26 02/02/2014 0810   CO2 26 01/04/2014 0907   BUN 13.3 02/02/2014 0810   BUN 15 01/04/2014 0907   CREATININE 1.0 02/02/2014 0810   CREATININE 0.92 01/04/2014 0907   CREATININE 0.98 12/09/2013 0716      Component Value Date/Time   CALCIUM 9.8 02/02/2014 0810   CALCIUM 9.7 01/04/2014 0907   ALKPHOS 68 02/02/2014 0810   ALKPHOS 51 01/04/2014 0907   AST 34 02/02/2014 0810   AST 46* 01/04/2014 0907   ALT 44 02/02/2014 0810   ALT 70* 01/04/2014 0907   BILITOT 0.55 02/02/2014 0810   BILITOT 0.5 01/04/2014 0907      ASSESSMENT & PLAN:  Diffuse large B cell lymphoma He is experiencing expected side effects from treatment. I will start with 50% dose adjustment of vincristine due to early signs of peripheral neuropathy after just 1 cycle of treatment. He is also not tolerating  high-dose prednisone and a plan to reduce it to 60 mg instead of 100 mg. I will reassess toxicity next cycle. We will proceed with treatment today without delay  Neuropathy due to chemotherapeutic drug I am concerned that he has experienced early signs of neuropathy after just 1 cycle of treatment. I plan to reduce vincristine dose by 50% from cycle 2 onwards  Mucositis due to chemotherapy This is only mild grade 1. I recommend conservative management with baking soda mix. If this is severe with cycle 2 of treatment, I plan to reduce the dose of Adriamycin and Cytoxan.   No orders of the defined types were placed in this encounter.   All questions were answered. The patient knows to call the clinic with any problems, questions or concerns. No barriers to learning was detected. I spent 40 minutes counseling the patient face to face. The total time spent in the appointment was 60 minutes and more than 50% was on counseling and review of test results     [facility], [place], MD 02/02/2014 8:52 PM

## 2014-02-02 NOTE — Patient Instructions (Signed)
[facility] Discharge Instructions for Patients Receiving Chemotherapy  Today you received the following chemotherapy agents:  Adriamycin, Vincristine, Cytoxan and Rituxan  To help prevent nausea and vomiting after your treatment, we encourage you to take your nausea medication as ordered per MD.   If you develop nausea and vomiting that is not controlled by your nausea medication, call the clinic.   BELOW ARE SYMPTOMS THAT SHOULD BE REPORTED IMMEDIATELY:  *FEVER GREATER THAN 100.5 F  *CHILLS WITH OR WITHOUT FEVER  NAUSEA AND VOMITING THAT IS NOT CONTROLLED WITH YOUR NAUSEA MEDICATION  *UNUSUAL SHORTNESS OF BREATH  *UNUSUAL BRUISING OR BLEEDING  TENDERNESS IN MOUTH AND THROAT WITH OR WITHOUT PRESENCE OF ULCERS  *URINARY PROBLEMS  *BOWEL PROBLEMS  UNUSUAL RASH Items with * indicate a potential emergency and should be followed up as soon as possible.  Feel free to call the clinic you have any questions or concerns. The clinic phone number is [phone].    

## 2014-02-02 NOTE — Assessment & Plan Note (Signed)
I am concerned that he has experienced early signs of neuropathy after just 1 cycle of treatment. I plan to reduce vincristine dose by 50% from cycle 2 onwards

## 2014-02-03 ENCOUNTER — Ambulatory Visit (HOSPITAL_BASED_OUTPATIENT_CLINIC_OR_DEPARTMENT_OTHER): Payer: BC Managed Care – PPO

## 2014-02-03 DIAGNOSIS — Z5189 Encounter for other specified aftercare: Secondary | ICD-10-CM

## 2014-02-03 DIAGNOSIS — C8330 Diffuse large B-cell lymphoma, unspecified site: Secondary | ICD-10-CM

## 2014-02-03 MED ORDER — PEGFILGRASTIM INJECTION 6 MG/0.6ML ~~LOC~~
6.0000 mg | PREFILLED_SYRINGE | Freq: Once | SUBCUTANEOUS | Status: AC
  Administered 2014-02-03: 6 mg via SUBCUTANEOUS
  Filled 2014-02-03: qty 0.6

## 2014-02-03 NOTE — Patient Instructions (Signed)
Pegfilgrastim injection What is this medicine? PEGFILGRASTIM (peg fil GRA stim) is a long-acting granulocyte colony-stimulating factor that stimulates the growth of neutrophils, a type of white blood cell important in the body's fight against infection. It is used to reduce the incidence of fever and infection in patients with certain types of cancer who are receiving chemotherapy that affects the bone marrow. This medicine may be used for other purposes; ask your health care provider or pharmacist if you have questions. COMMON BRAND NAME(S): Neulasta What should I tell my health care provider before I take this medicine? They need to know if you have any of these conditions: -latex allergy -ongoing radiation therapy -sickle cell disease -skin reactions to acrylic adhesives (On-Body Injector only) -an unusual or allergic reaction to pegfilgrastim, filgrastim, other medicines, foods, dyes, or preservatives -pregnant or trying to get pregnant -breast-feeding How should I use this medicine? This medicine is for injection under the skin. If you get this medicine at home, you will be taught how to prepare and give the pre-filled syringe or how to use the On-body Injector. Refer to the patient Instructions for Use for detailed instructions. Use exactly as directed. Take your medicine at regular intervals. Do not take your medicine more often than directed. It is important that you put your used needles and syringes in a special sharps container. Do not put them in a trash can. If you do not have a sharps container, call your pharmacist or healthcare provider to get one. Talk to your pediatrician regarding the use of this medicine in children. Special care may be needed. Overdosage: If you think you have taken too much of this medicine contact a poison control center or emergency room at once. NOTE: This medicine is only for you. Do not share this medicine with others. What if I miss a dose? It is  important not to miss your dose. Call your doctor or health care professional if you miss your dose. If you miss a dose due to an On-body Injector failure or leakage, a new dose should be administered as soon as possible using a single prefilled syringe for manual use. What may interact with this medicine? Interactions have not been studied. Give your health care provider a list of all the medicines, herbs, non-prescription drugs, or dietary supplements you use. Also tell them if you smoke, drink alcohol, or use illegal drugs. Some items may interact with your medicine. This list may not describe all possible interactions. Give your health care provider a list of all the medicines, herbs, non-prescription drugs, or dietary supplements you use. Also tell them if you smoke, drink alcohol, or use illegal drugs. Some items may interact with your medicine. What should I watch for while using this medicine? You may need blood work done while you are taking this medicine. If you are going to need a MRI, CT scan, or other procedure, tell your doctor that you are using this medicine (On-Body Injector only). What side effects may I notice from receiving this medicine? Side effects that you should report to your doctor or health care professional as soon as possible: -allergic reactions like skin rash, itching or hives, swelling of the face, lips, or tongue -dizziness -fever -pain, redness, or irritation at site where injected -pinpoint red spots on the skin -shortness of breath or breathing problems -stomach or side pain, or pain at the shoulder -swelling -tiredness -trouble passing urine Side effects that usually do not require medical attention (report to your doctor   or health care professional if they continue or are bothersome): -bone pain -muscle pain This list may not describe all possible side effects. Call your doctor for medical advice about side effects. You may report side effects to FDA at  1-800-FDA-1088. Where should I keep my medicine? Keep out of the reach of children. Store pre-filled syringes in a refrigerator between 2 and 8 degrees C (36 and 46 degrees F). Do not freeze. Keep in carton to protect from light. Throw away this medicine if it is left out of the refrigerator for more than 48 hours. Throw away any unused medicine after the expiration date. NOTE: This sheet is a summary. It may not cover all possible information. If you have questions about this medicine, talk to your doctor, pharmacist, or health care provider.  2015, Elsevier/Gold Standard. (2013-04-23 16:14:05)  

## 2014-02-23 ENCOUNTER — Ambulatory Visit (HOSPITAL_BASED_OUTPATIENT_CLINIC_OR_DEPARTMENT_OTHER): Payer: BLUE CROSS/BLUE SHIELD

## 2014-02-23 ENCOUNTER — Telehealth: Payer: Self-pay | Admitting: Hematology and Oncology

## 2014-02-23 ENCOUNTER — Encounter: Payer: Self-pay | Admitting: *Deleted

## 2014-02-23 ENCOUNTER — Other Ambulatory Visit (HOSPITAL_BASED_OUTPATIENT_CLINIC_OR_DEPARTMENT_OTHER): Payer: BLUE CROSS/BLUE SHIELD

## 2014-02-23 ENCOUNTER — Ambulatory Visit: Payer: BLUE CROSS/BLUE SHIELD

## 2014-02-23 ENCOUNTER — Ambulatory Visit (HOSPITAL_BASED_OUTPATIENT_CLINIC_OR_DEPARTMENT_OTHER): Payer: BLUE CROSS/BLUE SHIELD | Admitting: Hematology and Oncology

## 2014-02-23 ENCOUNTER — Telehealth: Payer: Self-pay | Admitting: *Deleted

## 2014-02-23 ENCOUNTER — Encounter: Payer: Self-pay | Admitting: Hematology and Oncology

## 2014-02-23 VITALS — BP 119/74 | HR 82 | Temp 98.1°F | Resp 18 | Ht 72.0 in | Wt 270.1 lb

## 2014-02-23 DIAGNOSIS — Z95828 Presence of other vascular implants and grafts: Secondary | ICD-10-CM

## 2014-02-23 DIAGNOSIS — T451X5A Adverse effect of antineoplastic and immunosuppressive drugs, initial encounter: Secondary | ICD-10-CM

## 2014-02-23 DIAGNOSIS — K1231 Oral mucositis (ulcerative) due to antineoplastic therapy: Secondary | ICD-10-CM

## 2014-02-23 DIAGNOSIS — G620 Drug-induced polyneuropathy: Secondary | ICD-10-CM

## 2014-02-23 DIAGNOSIS — Z006 Encounter for examination for normal comparison and control in clinical research program: Secondary | ICD-10-CM

## 2014-02-23 DIAGNOSIS — C8330 Diffuse large B-cell lymphoma, unspecified site: Secondary | ICD-10-CM

## 2014-02-23 DIAGNOSIS — Z5112 Encounter for antineoplastic immunotherapy: Secondary | ICD-10-CM

## 2014-02-23 LAB — COMPREHENSIVE METABOLIC PANEL (CC13)
ALBUMIN: 3.5 g/dL (ref 3.5–5.0)
ALT: 39 U/L (ref 0–55)
ANION GAP: 8 meq/L (ref 3–11)
AST: 27 U/L (ref 5–34)
Alkaline Phosphatase: 74 U/L (ref 40–150)
BILIRUBIN TOTAL: 0.45 mg/dL (ref 0.20–1.20)
BUN: 12.5 mg/dL (ref 7.0–26.0)
CALCIUM: 9.1 mg/dL (ref 8.4–10.4)
CHLORIDE: 106 meq/L (ref 98–109)
CO2: 26 mEq/L (ref 22–29)
Creatinine: 1.1 mg/dL (ref 0.7–1.3)
EGFR: 76 mL/min/{1.73_m2} — AB (ref >90)
Glucose: 154 mg/dl — ABNORMAL HIGH (ref 70–140)
Potassium: 4 mEq/L (ref 3.5–5.1)
Sodium: 140 mEq/L (ref 136–145)
TOTAL PROTEIN: 6.3 g/dL — AB (ref 6.4–8.3)

## 2014-02-23 LAB — CBC WITH DIFFERENTIAL/PLATELET
BASO%: 0.7 % (ref 0.0–2.0)
BASOS ABS: 0 10*3/uL (ref 0.0–0.1)
EOS%: 0.2 % (ref 0.0–7.0)
Eosinophils Absolute: 0 10*3/uL (ref 0.0–0.5)
HCT: 39.3 % (ref 38.4–49.9)
HGB: 13.3 g/dL (ref 13.0–17.1)
LYMPH#: 0.8 10*3/uL — AB (ref 0.9–3.3)
LYMPH%: 13.5 % — ABNORMAL LOW (ref 14.0–49.0)
MCH: 32.9 pg (ref 27.2–33.4)
MCHC: 33.8 g/dL (ref 32.0–36.0)
MCV: 97.3 fL (ref 79.3–98.0)
MONO#: 0.6 10*3/uL (ref 0.1–0.9)
MONO%: 10.7 % (ref 0.0–14.0)
NEUT#: 4.2 10*3/uL (ref 1.5–6.5)
NEUT%: 74.9 % (ref 39.0–75.0)
Platelets: 275 10*3/uL (ref 140–400)
RBC: 4.04 10*6/uL — ABNORMAL LOW (ref 4.20–5.82)
RDW: 14.1 % (ref 11.0–14.6)
WBC: 5.6 10*3/uL (ref 4.0–10.3)

## 2014-02-23 LAB — RESEARCH LABS

## 2014-02-23 MED ORDER — DIPHENHYDRAMINE HCL 25 MG PO CAPS
50.0000 mg | ORAL_CAPSULE | Freq: Once | ORAL | Status: AC
  Administered 2014-02-23: 50 mg via ORAL

## 2014-02-23 MED ORDER — DEXAMETHASONE SODIUM PHOSPHATE 20 MG/5ML IJ SOLN
20.0000 mg | Freq: Once | INTRAMUSCULAR | Status: AC
  Administered 2014-02-23: 20 mg via INTRAVENOUS

## 2014-02-23 MED ORDER — VINCRISTINE SULFATE CHEMO INJECTION 1 MG/ML
1.0000 mg | Freq: Once | INTRAVENOUS | Status: AC
  Administered 2014-02-23: 1 mg via INTRAVENOUS
  Filled 2014-02-23: qty 1

## 2014-02-23 MED ORDER — SODIUM CHLORIDE 0.9 % IJ SOLN
10.0000 mL | INTRAMUSCULAR | Status: DC | PRN
  Administered 2014-02-23: 10 mL
  Filled 2014-02-23: qty 10

## 2014-02-23 MED ORDER — SODIUM CHLORIDE 0.9 % IJ SOLN
10.0000 mL | INTRAMUSCULAR | Status: DC | PRN
  Administered 2014-02-23: 10 mL via INTRAVENOUS
  Filled 2014-02-23: qty 10

## 2014-02-23 MED ORDER — ACETAMINOPHEN 325 MG PO TABS
ORAL_TABLET | ORAL | Status: AC
  Filled 2014-02-23: qty 2

## 2014-02-23 MED ORDER — DIPHENHYDRAMINE HCL 25 MG PO CAPS
ORAL_CAPSULE | ORAL | Status: AC
  Filled 2014-02-23: qty 2

## 2014-02-23 MED ORDER — SODIUM CHLORIDE 0.9 % IV SOLN
375.0000 mg/m2 | Freq: Once | INTRAVENOUS | Status: AC
  Administered 2014-02-23: 900 mg via INTRAVENOUS
  Filled 2014-02-23: qty 90

## 2014-02-23 MED ORDER — SODIUM CHLORIDE 0.9 % IV SOLN
Freq: Once | INTRAVENOUS | Status: AC
  Administered 2014-02-23: 10:00:00 via INTRAVENOUS

## 2014-02-23 MED ORDER — ACETAMINOPHEN 325 MG PO TABS
650.0000 mg | ORAL_TABLET | Freq: Once | ORAL | Status: AC
  Administered 2014-02-23: 650 mg via ORAL

## 2014-02-23 MED ORDER — HEPARIN SOD (PORK) LOCK FLUSH 100 UNIT/ML IV SOLN
500.0000 [IU] | Freq: Once | INTRAVENOUS | Status: AC | PRN
  Administered 2014-02-23: 500 [IU]
  Filled 2014-02-23: qty 5

## 2014-02-23 MED ORDER — CYCLOPHOSPHAMIDE CHEMO INJECTION 1 GM
750.0000 mg/m2 | Freq: Once | INTRAMUSCULAR | Status: AC
  Administered 2014-02-23: 1840 mg via INTRAVENOUS
  Filled 2014-02-23: qty 92

## 2014-02-23 MED ORDER — DOXORUBICIN HCL CHEMO IV INJECTION 2 MG/ML
50.0000 mg/m2 | Freq: Once | INTRAVENOUS | Status: AC
  Administered 2014-02-23: 124 mg via INTRAVENOUS
  Filled 2014-02-23: qty 62

## 2014-02-23 MED ORDER — ONDANSETRON 16 MG/50ML IVPB (CHCC)
INTRAVENOUS | Status: AC
  Filled 2014-02-23: qty 16

## 2014-02-23 MED ORDER — ONDANSETRON 16 MG/50ML IVPB (CHCC)
16.0000 mg | Freq: Once | INTRAVENOUS | Status: AC
  Administered 2014-02-23: 16 mg via INTRAVENOUS

## 2014-02-23 MED ORDER — DEXAMETHASONE SODIUM PHOSPHATE 20 MG/5ML IJ SOLN
INTRAMUSCULAR | Status: AC
  Filled 2014-02-23: qty 5

## 2014-02-23 NOTE — Assessment & Plan Note (Signed)
This is only mild grade 1. I recommend conservative management with baking soda mix.

## 2014-02-23 NOTE — Patient Instructions (Signed)

## 2014-02-23 NOTE — Assessment & Plan Note (Addendum)
His neuropathy has improved since we reduced the dose of vincristine. We will continue on the same dose this cycle.

## 2014-02-23 NOTE — Telephone Encounter (Signed)
Gave avs & calendar for February. °

## 2014-02-23 NOTE — Patient Instructions (Signed)
[facility] Discharge Instructions for Patients Receiving Chemotherapy  Today you received the following chemotherapy agents Rituxan, Cytoxan, Adriamycin, and Vincristine.   To help prevent nausea and vomiting after your treatment, we encourage you to take your nausea medication.   If you develop nausea and vomiting that is not controlled by your nausea medication, call the clinic.   BELOW ARE SYMPTOMS THAT SHOULD BE REPORTED IMMEDIATELY:  *FEVER GREATER THAN 100.5 F  *CHILLS WITH OR WITHOUT FEVER  NAUSEA AND VOMITING THAT IS NOT CONTROLLED WITH YOUR NAUSEA MEDICATION  *UNUSUAL SHORTNESS OF BREATH  *UNUSUAL BRUISING OR BLEEDING  TENDERNESS IN MOUTH AND THROAT WITH OR WITHOUT PRESENCE OF ULCERS  *URINARY PROBLEMS  *BOWEL PROBLEMS  UNUSUAL RASH Items with * indicate a potential emergency and should be followed up as soon as possible.  Feel free to call the clinic you have any questions or concerns. The clinic phone number is [phone].

## 2014-02-23 NOTE — Telephone Encounter (Signed)
NO NOTE

## 2014-02-23 NOTE — Progress Notes (Signed)
02/23/2014  0830  [id number] PREVENT study Met with patient and his wife during MD visit.  Patient reports having no side effects related to the study drug.  He denies myalgias.  He said he had a headache last week and related it to "cold weather" and a fever blister he had.  He denies abd. pain, constipation, nausea, upper resp. Infection, or muscle pain.  Patient reports that he has taken his medication every day and has been keeping up with his medication calendars.  Research labs drawn today for AST/ALT, and CK.  Per MD, no known side effects at this time from study medication.

## 2014-02-23 NOTE — Progress Notes (Signed)
[place] OFFICE PROGRESS NOTE  Patient Care Team: Mikey Kirschner, MD as PCP - General (Family Medicine)  SUMMARY OF ONCOLOGIC HISTORY: Oncology History   Diffuse large B cell lymphoma   Staging form: Lymphoid Neoplasms, AJCC 6th Edition     Clinical stage from 12/30/2013: Stage III - Signed by Heath Lark, MD on 01/08/2014     Pathologic: No stage assigned - Unsigned       Diffuse large B cell lymphoma   12/14/2013 Imaging CT scan of the abdomen and pelvis showed bulky left iliac chain and left inguinal adenopathy, highly worrisome for lymphoma.   12/22/2013 Surgery He underwent excisional lymph node biopsy of the left inguinal region that confirm lymphoma diagnosis. No   12/22/2013 Pathology Results Accession: [id number] pathology showed a high-grade follicular lymphoma with possibly foci of diffuse large B-cell lymphoma   01/04/2014 Bone Marrow Biopsy BM biopsy was negative with normal cytogenetics   01/05/2014 Imaging PET CT scan showed lymph nodes involvement of both sides of her diaphragm   01/06/2014 Surgery He has port placed   01/07/2014 Imaging ECHO showed preserved EF of 73%, mildly dilated atrium   01/12/2014 -  Chemotherapy He is started on R CHOP chemotherapy   02/02/2014 Adverse Reaction Vincristine dose was reduced by 50% due to early signs of peripheral neuropathy    INTERVAL HISTORY: Please see below for problem oriented charting. His neuropathy has improved. He tolerated last cycle well. He complained of mild mucositis, not severe.  REVIEW OF SYSTEMS:   Constitutional: Denies fevers, chills or abnormal weight loss Eyes: Denies blurriness of vision Respiratory: Denies cough, dyspnea or wheezes Cardiovascular: Denies palpitation, chest discomfort or lower extremity swelling Gastrointestinal:  Denies nausea, heartburn or change in bowel habits Skin: Denies abnormal skin rashes Lymphatics: Denies new lymphadenopathy or easy  bruising Neurological:Denies numbness, tingling or new weaknesses Behavioral/Psych: Mood is stable, no new changes  All other systems were reviewed with the patient and are negative.  I have reviewed the past medical history, past surgical history, social history and family history with the patient and they are unchanged from previous note.  ALLERGIES:  has No Known Allergies.  MEDICATIONS:  Current Outpatient Prescriptions  Medication Sig Dispense Refill  . allopurinol (ZYLOPRIM) 300 MG tablet Take 1 tablet (300 mg total) by mouth daily. 30 tablet 3  . Atorvastatin Calcium (INVESTIGATIONAL ATORVASTATIN/PLACEBO) 40 MG tablet [facility] [id number] Take 1 tablet by mouth daily. Take 2 doses (these doses must be 12 hours apart) prior to first chemotherapy treatment. Then take 1 tablet daily by mouth with or without food. 180 tablet 0  . enalapril (VASOTEC) 20 MG tablet Take 20 mg by mouth every morning.    . lidocaine-prilocaine (EMLA) cream Apply to affected area once 30 g 3  . ondansetron (ZOFRAN) 8 MG tablet Take 1 tablet (8 mg total) by mouth every 8 (eight) hours as needed for nausea. 30 tablet 1  . predniSONE (DELTASONE) 20 MG tablet Take 3 tablets (60 mg total) by mouth daily with breakfast. Take on days 2-5 of chemotherapy every 21 days 60 tablet 0  . prochlorperazine (COMPAZINE) 10 MG tablet Take 1 tablet (10 mg total) by mouth every 6 (six) hours as needed (Nausea or vomiting). 30 tablet 6   No current facility-administered medications for this visit.   Facility-Administered Medications Ordered in Other Visits  Medication Dose Route Frequency Provider Last Rate Last Dose  . sodium chloride 0.9 % injection 10 mL  10 mL Intracatheter PRN Heath Lark, MD   10 mL at 02/23/14 1334    PHYSICAL EXAMINATION: ECOG PERFORMANCE STATUS: 1 - Symptomatic but completely ambulatory  Filed Vitals:   02/23/14 0826  BP: 119/74  Pulse: 82  Temp: 98.1 F (36.7 C)  Resp: 18   Filed Weights    02/23/14 0826  Weight: 270 lb 1.6 oz (122.517 kg)    GENERAL:alert, no distress and comfortable SKIN: skin color, texture, turgor are normal, no rashes or significant lesions EYES: normal, Conjunctiva are pink and non-injected, sclera clear OROPHARYNX:no exudate, no erythema and lips, buccal mucosa, and tongue normal . No signs of ulceration or thrush. NECK: supple, thyroid normal size, non-tender, without nodularity LYMPH:  no palpable lymphadenopathy in the cervical, axillary or inguinal LUNGS: clear to auscultation and percussion with normal breathing effort HEART: regular rate & rhythm and no murmurs and no lower extremity edema ABDOMEN:abdomen soft, non-tender and normal bowel sounds Musculoskeletal:no cyanosis of digits and no clubbing  NEURO: alert & oriented x 3 with fluent speech, no focal motor/sensory deficits  LABORATORY DATA:  I have reviewed the data as listed    Component Value Date/Time   NA 140 02/23/2014 0811   NA 137 01/04/2014 0907   K 4.0 02/23/2014 0811   K 4.1 01/04/2014 0907   CL 100 01/04/2014 0907   CO2 26 02/23/2014 0811   CO2 26 01/04/2014 0907   GLUCOSE 154* 02/23/2014 0811   GLUCOSE 112* 01/04/2014 0907   BUN 12.5 02/23/2014 0811   BUN 15 01/04/2014 0907   CREATININE 1.1 02/23/2014 0811   CREATININE 0.92 01/04/2014 0907   CREATININE 0.98 12/09/2013 0716   CALCIUM 9.1 02/23/2014 0811   CALCIUM 9.7 01/04/2014 0907   PROT 6.3* 02/23/2014 0811   PROT 7.0 01/04/2014 0907   ALBUMIN 3.5 02/23/2014 0811   ALBUMIN 3.6 01/04/2014 0907   AST 27 02/23/2014 0811   AST 46* 01/04/2014 0907   ALT 39 02/23/2014 0811   ALT 70* 01/04/2014 0907   ALKPHOS 74 02/23/2014 0811   ALKPHOS 51 01/04/2014 0907   BILITOT 0.45 02/23/2014 0811   BILITOT 0.5 01/04/2014 0907   GFRNONAA >90 01/04/2014 0907   GFRAA >90 01/04/2014 0907    No results found for: SPEP, UPEP  Lab Results  Component Value Date   WBC 5.6 02/23/2014   NEUTROABS 4.2 02/23/2014   HGB 13.3  02/23/2014   HCT 39.3 02/23/2014   MCV 97.3 02/23/2014   PLT 275 02/23/2014      Chemistry      Component Value Date/Time   NA 140 02/23/2014 0811   NA 137 01/04/2014 0907   K 4.0 02/23/2014 0811   K 4.1 01/04/2014 0907   CL 100 01/04/2014 0907   CO2 26 02/23/2014 0811   CO2 26 01/04/2014 0907   BUN 12.5 02/23/2014 0811   BUN 15 01/04/2014 0907   CREATININE 1.1 02/23/2014 0811   CREATININE 0.92 01/04/2014 0907   CREATININE 0.98 12/09/2013 0716      Component Value Date/Time   CALCIUM 9.1 02/23/2014 0811   CALCIUM 9.7 01/04/2014 0907   ALKPHOS 74 02/23/2014 0811   ALKPHOS 51 01/04/2014 0907   AST 27 02/23/2014 0811   AST 46* 01/04/2014 0907   ALT 39 02/23/2014 0811   ALT 70* 01/04/2014 0907   BILITOT 0.45 02/23/2014 0811   BILITOT 0.5 01/04/2014 0907      ASSESSMENT & PLAN:  Diffuse large B cell lymphoma He is experiencing expected  side effects from treatment. He had 50% dose adjustment of vincristine due to early signs of peripheral neuropathy after just 1 cycle of treatment. He is also not tolerating high-dose prednisone and a plan to reduce it to 60 mg instead of 100 mg. Recently, he had mucositis which he did not attribute to chemotherapy. It is not bothering him. We will proceed with treatment today without delay. I plan to order PET/CT scan before I see him back to assess response to treatment.     Mucositis due to chemotherapy This is only mild grade 1. I recommend conservative management with baking soda mix.   Neuropathy due to chemotherapeutic drug His neuropathy has improved since we reduced the dose of vincristine. We will continue on the same dose this cycle.    Orders Placed This Encounter  Procedures  . NM PET Image Restag (PS) Skull Base To Thigh    Standing Status: Future     Number of Occurrences:      Standing Expiration Date: 04/25/2015    Order Specific Question:  Reason for Exam (SYMPTOM  OR DIAGNOSIS REQUIRED)    Answer:  staging  lymphoma  assess response to Rx    Order Specific Question:  Preferred imaging location?    Answer:  [facility]   All questions were answered. The patient knows to call the clinic with any problems, questions or concerns. No barriers to learning was detected. I spent 30 minutes counseling the patient face to face. The total time spent in the appointment was 40 minutes and more than 50% was on counseling and review of test results     [facility], [place], MD 02/23/2014 1:40 PM

## 2014-02-23 NOTE — Assessment & Plan Note (Signed)
He is experiencing expected side effects from treatment. He had 50% dose adjustment of vincristine due to early signs of peripheral neuropathy after just 1 cycle of treatment. He is also not tolerating high-dose prednisone and a plan to reduce it to 60 mg instead of 100 mg. Recently, he had mucositis which he did not attribute to chemotherapy. It is not bothering him. We will proceed with treatment today without delay. I plan to order PET/CT scan before I see him back to assess response to treatment.

## 2014-02-24 ENCOUNTER — Ambulatory Visit (HOSPITAL_BASED_OUTPATIENT_CLINIC_OR_DEPARTMENT_OTHER): Payer: BLUE CROSS/BLUE SHIELD

## 2014-02-24 ENCOUNTER — Ambulatory Visit: Payer: BC Managed Care – PPO

## 2014-02-24 ENCOUNTER — Telehealth: Payer: Self-pay | Admitting: *Deleted

## 2014-02-24 DIAGNOSIS — C8330 Diffuse large B-cell lymphoma, unspecified site: Secondary | ICD-10-CM

## 2014-02-24 DIAGNOSIS — Z5189 Encounter for other specified aftercare: Secondary | ICD-10-CM

## 2014-02-24 MED ORDER — PEGFILGRASTIM INJECTION 6 MG/0.6ML ~~LOC~~
6.0000 mg | PREFILLED_SYRINGE | Freq: Once | SUBCUTANEOUS | Status: AC
  Administered 2014-02-24: 6 mg via SUBCUTANEOUS
  Filled 2014-02-24: qty 0.6

## 2014-02-24 NOTE — Telephone Encounter (Signed)
Pt left VM yesterday he had a scheduling conflict.  He has too much time between lab/flush appt and PET scan.   Instructed him to go to Scheduling and he agreed.

## 2014-03-11 ENCOUNTER — Other Ambulatory Visit (HOSPITAL_BASED_OUTPATIENT_CLINIC_OR_DEPARTMENT_OTHER): Payer: BLUE CROSS/BLUE SHIELD

## 2014-03-11 ENCOUNTER — Other Ambulatory Visit: Payer: BLUE CROSS/BLUE SHIELD

## 2014-03-11 ENCOUNTER — Other Ambulatory Visit: Payer: Self-pay | Admitting: Family Medicine

## 2014-03-11 ENCOUNTER — Ambulatory Visit (HOSPITAL_COMMUNITY)
Admission: RE | Admit: 2014-03-11 | Discharge: 2014-03-11 | Disposition: A | Discharge status: Home / Self Care | Payer: BLUE CROSS/BLUE SHIELD | Source: Ambulatory Visit | Attending: Hematology and Oncology | Admitting: Hematology and Oncology

## 2014-03-11 ENCOUNTER — Other Ambulatory Visit: Payer: Self-pay | Admitting: Hematology and Oncology

## 2014-03-11 ENCOUNTER — Ambulatory Visit: Payer: BLUE CROSS/BLUE SHIELD

## 2014-03-11 ENCOUNTER — Encounter (HOSPITAL_COMMUNITY): Payer: Self-pay

## 2014-03-11 ENCOUNTER — Encounter: Payer: Self-pay | Admitting: Hematology and Oncology

## 2014-03-11 VITALS — BP 120/74 | HR 76 | Temp 98.3°F

## 2014-03-11 DIAGNOSIS — C8330 Diffuse large B-cell lymphoma, unspecified site: Secondary | ICD-10-CM | POA: Diagnosis not present

## 2014-03-11 DIAGNOSIS — Z95828 Presence of other vascular implants and grafts: Secondary | ICD-10-CM

## 2014-03-11 LAB — COMPREHENSIVE METABOLIC PANEL (CC13)
ALT: 33 U/L (ref 0–55)
ANION GAP: 10 meq/L (ref 3–11)
AST: 26 U/L (ref 5–34)
Albumin: 3.6 g/dL (ref 3.5–5.0)
Alkaline Phosphatase: 87 U/L (ref 40–150)
BILIRUBIN TOTAL: 0.62 mg/dL (ref 0.20–1.20)
BUN: 10.2 mg/dL (ref 7.0–26.0)
CHLORIDE: 107 meq/L (ref 98–109)
CO2: 25 mEq/L (ref 22–29)
Calcium: 9.5 mg/dL (ref 8.4–10.4)
Creatinine: 0.9 mg/dL (ref 0.7–1.3)
EGFR: >90 mL/min/{1.73_m2} (ref >90)
Glucose: 94 mg/dl (ref 70–140)
POTASSIUM: 4.3 meq/L (ref 3.5–5.1)
SODIUM: 141 meq/L (ref 136–145)
Total Protein: 6.4 g/dL (ref 6.4–8.3)

## 2014-03-11 LAB — CBC WITH DIFFERENTIAL/PLATELET
BASO%: 0.5 % (ref 0.0–2.0)
BASOS ABS: 0 10*3/uL (ref 0.0–0.1)
EOS%: 0.2 % (ref 0.0–7.0)
Eosinophils Absolute: 0 10*3/uL (ref 0.0–0.5)
HCT: 36.6 % — ABNORMAL LOW (ref 38.4–49.9)
HGB: 12.2 g/dL — ABNORMAL LOW (ref 13.0–17.1)
LYMPH#: 0.8 10*3/uL — AB (ref 0.9–3.3)
LYMPH%: 13.8 % — ABNORMAL LOW (ref 14.0–49.0)
MCH: 33 pg (ref 27.2–33.4)
MCHC: 33.3 g/dL (ref 32.0–36.0)
MCV: 98.9 fL — AB (ref 79.3–98.0)
MONO#: 1 10*3/uL — ABNORMAL HIGH (ref 0.1–0.9)
MONO%: 16.8 % — ABNORMAL HIGH (ref 0.0–14.0)
NEUT#: 4 10*3/uL (ref 1.5–6.5)
NEUT%: 68.7 % (ref 39.0–75.0)
PLATELETS: 236 10*3/uL (ref 140–400)
RBC: 3.7 10*6/uL — AB (ref 4.20–5.82)
RDW: 14.7 % — AB (ref 11.0–14.6)
WBC: 5.8 10*3/uL (ref 4.0–10.3)

## 2014-03-11 LAB — GLUCOSE, CAPILLARY: Glucose-Capillary: 100 mg/dL — ABNORMAL HIGH (ref 70–99)

## 2014-03-11 MED ORDER — SODIUM CHLORIDE 0.9 % IJ SOLN
10.0000 mL | INTRAMUSCULAR | Status: DC | PRN
  Administered 2014-03-11: 10 mL via INTRAVENOUS
  Filled 2014-03-11: qty 10

## 2014-03-11 MED ORDER — HEPARIN SOD (PORK) LOCK FLUSH 100 UNIT/ML IV SOLN
500.0000 [IU] | Freq: Once | INTRAVENOUS | Status: AC
  Administered 2014-03-11: 500 [IU] via INTRAVENOUS
  Filled 2014-03-11: qty 5

## 2014-03-11 MED ORDER — FLUDEOXYGLUCOSE F - 18 (FDG) INJECTION
13.8000 | Freq: Once | INTRAVENOUS | Status: AC | PRN
  Administered 2014-03-11: 13.8 via INTRAVENOUS

## 2014-03-11 NOTE — Patient Instructions (Signed)

## 2014-03-12 ENCOUNTER — Ambulatory Visit (HOSPITAL_BASED_OUTPATIENT_CLINIC_OR_DEPARTMENT_OTHER): Payer: BLUE CROSS/BLUE SHIELD | Admitting: Hematology and Oncology

## 2014-03-12 ENCOUNTER — Telehealth: Payer: Self-pay | Admitting: Hematology and Oncology

## 2014-03-12 ENCOUNTER — Encounter: Payer: Self-pay | Admitting: Hematology and Oncology

## 2014-03-12 VITALS — BP 112/68 | HR 81 | Temp 98.0°F | Resp 18 | Ht 72.0 in | Wt 267.4 lb

## 2014-03-12 DIAGNOSIS — C8330 Diffuse large B-cell lymphoma, unspecified site: Secondary | ICD-10-CM

## 2014-03-12 DIAGNOSIS — I10 Essential (primary) hypertension: Secondary | ICD-10-CM

## 2014-03-12 DIAGNOSIS — D630 Anemia in neoplastic disease: Secondary | ICD-10-CM

## 2014-03-12 DIAGNOSIS — T451X5A Adverse effect of antineoplastic and immunosuppressive drugs, initial encounter: Secondary | ICD-10-CM

## 2014-03-12 DIAGNOSIS — K1231 Oral mucositis (ulcerative) due to antineoplastic therapy: Secondary | ICD-10-CM

## 2014-03-12 DIAGNOSIS — G620 Drug-induced polyneuropathy: Secondary | ICD-10-CM

## 2014-03-12 MED ORDER — ENALAPRIL MALEATE 20 MG PO TABS: 20.0000 mg | ORAL_TABLET | Freq: Every day | ORAL | Status: DC

## 2014-03-12 NOTE — Assessment & Plan Note (Signed)
He is experiencing expected side effects from treatment. He had 50% dose adjustment of vincristine due to early signs of peripheral neuropathy after just 1 cycle of treatment. He is also not tolerating high-dose prednisone and a plan to reduce it to 60 mg instead of 100 mg. Recently, he had mucositis which he did not attribute to chemotherapy. It is not bothering him. We will proceed with treatment next week without delay. Repeat PET/CT scan showed near complete response to treatment.

## 2014-03-12 NOTE — Assessment & Plan Note (Signed)
This is only mild grade 1. I recommend conservative management with baking soda mix.

## 2014-03-12 NOTE — Telephone Encounter (Signed)
gv adn printed appt sched and avs for pt for Feb and march sed added tx.

## 2014-03-12 NOTE — Progress Notes (Signed)
[place] OFFICE PROGRESS NOTE  Patient Care Team: Mikey Kirschner, MD as PCP - General (Family Medicine)  SUMMARY OF ONCOLOGIC HISTORY: Oncology History   Diffuse large B cell lymphoma   Staging form: Lymphoid Neoplasms, AJCC 6th Edition     Clinical stage from 12/30/2013: Stage III - Signed by Heath Lark, MD on 01/08/2014     Pathologic: No stage assigned - Unsigned       Diffuse large B cell lymphoma   12/14/2013 Imaging CT scan of the abdomen and pelvis showed bulky left iliac chain and left inguinal adenopathy, highly worrisome for lymphoma.   12/22/2013 Surgery He underwent excisional lymph node biopsy of the left inguinal region that confirm lymphoma diagnosis. No   12/22/2013 Pathology Results Accession: [id number] pathology showed a high-grade follicular lymphoma with possibly foci of diffuse large B-cell lymphoma   01/04/2014 Bone Marrow Biopsy BM biopsy was negative with normal cytogenetics   01/05/2014 Imaging PET CT scan showed lymph nodes involvement of both sides of her diaphragm   01/06/2014 Surgery He has port placed   01/07/2014 Imaging ECHO showed preserved EF of 73%, mildly dilated atrium   01/12/2014 -  Chemotherapy He is started on R CHOP chemotherapy   02/02/2014 Adverse Reaction Vincristine dose was reduced by 50% due to early signs of peripheral neuropathy   03/11/2014 Imaging Repeat PET scan showed near complete response to treatment    INTERVAL HISTORY: Please see below for problem oriented charting. He is seen prior to cycle 4 of treatment. His symptoms are about the same. He has fatigue after treatment. He had very mild mucositis, resolved with conservative management. Peripheral neuropathy is about the same.  REVIEW OF SYSTEMS:   Constitutional: Denies fevers, chills or abnormal weight loss Eyes: Denies blurriness of vision Respiratory: Denies cough, dyspnea or wheezes Cardiovascular: Denies palpitation, chest discomfort or lower  extremity swelling Gastrointestinal:  Denies nausea, heartburn or change in bowel habits Skin: Denies abnormal skin rashes Lymphatics: Denies new lymphadenopathy or easy bruising Neurological:Denies numbness, tingling or new weaknesses Behavioral/Psych: Mood is stable, no new changes  All other systems were reviewed with the patient and are negative.  I have reviewed the past medical history, past surgical history, social history and family history with the patient and they are unchanged from previous note.  ALLERGIES:  has No Known Allergies.  MEDICATIONS:  Current Outpatient Prescriptions  Medication Sig Dispense Refill  . Atorvastatin Calcium (INVESTIGATIONAL ATORVASTATIN/PLACEBO) 40 MG tablet [facility] [id number] Take 1 tablet by mouth daily. Take 2 doses (these doses must be 12 hours apart) prior to first chemotherapy treatment. Then take 1 tablet daily by mouth with or without food. 180 tablet 0  . enalapril (VASOTEC) 20 MG tablet Take 1 tablet (20 mg total) by mouth at bedtime. 90 tablet 3  . lidocaine-prilocaine (EMLA) cream Apply to affected area once 30 g 3  . ondansetron (ZOFRAN) 8 MG tablet Take 1 tablet (8 mg total) by mouth every 8 (eight) hours as needed for nausea. 30 tablet 1  . predniSONE (DELTASONE) 20 MG tablet Take 3 tablets (60 mg total) by mouth daily with breakfast. Take on days 2-5 of chemotherapy every 21 days 60 tablet 0  . prochlorperazine (COMPAZINE) 10 MG tablet Take 1 tablet (10 mg total) by mouth every 6 (six) hours as needed (Nausea or vomiting). 30 tablet 6  . allopurinol (ZYLOPRIM) 300 MG tablet Take 1 tablet (300 mg total) by mouth daily. (Patient not taking: Reported  on 03/12/2014) 30 tablet 3   No current facility-administered medications for this visit.    PHYSICAL EXAMINATION: ECOG PERFORMANCE STATUS: 0 - Asymptomatic  Filed Vitals:   03/12/14 1116  BP: 112/68  Pulse: 81  Temp: 98 F (36.7 C)  Resp: 18   Filed Weights   03/12/14 1116   Weight: 267 lb 6.4 oz (121.292 kg)    GENERAL:alert, no distress and comfortable. He is morbidly obese SKIN: skin color, texture, turgor are normal, no rashes or significant lesions EYES: normal, Conjunctiva are pink and non-injected, sclera clear OROPHARYNX:no exudate, no erythema and lips, buccal mucosa, and tongue normal  NECK: supple, thyroid normal size, non-tender, without nodularity LYMPH:  no palpable lymphadenopathy in the cervical, axillary or inguinal LUNGS: clear to auscultation and percussion with normal breathing effort HEART: regular rate & rhythm and no murmurs and no lower extremity edema ABDOMEN:abdomen soft, non-tender and normal bowel sounds Musculoskeletal:no cyanosis of digits and no clubbing  NEURO: alert & oriented x 3 with fluent speech, no focal motor/sensory deficits  LABORATORY DATA:  I have reviewed the data as listed    Component Value Date/Time   NA 141 03/11/2014 1129   NA 137 01/04/2014 0907   K 4.3 03/11/2014 1129   K 4.1 01/04/2014 0907   CL 100 01/04/2014 0907   CO2 25 03/11/2014 1129   CO2 26 01/04/2014 0907   GLUCOSE 94 03/11/2014 1129   GLUCOSE 112* 01/04/2014 0907   BUN 10.2 03/11/2014 1129   BUN 15 01/04/2014 0907   CREATININE 0.9 03/11/2014 1129   CREATININE 0.92 01/04/2014 0907   CREATININE 0.98 12/09/2013 0716   CALCIUM 9.5 03/11/2014 1129   CALCIUM 9.7 01/04/2014 0907   PROT 6.4 03/11/2014 1129   PROT 7.0 01/04/2014 0907   ALBUMIN 3.6 03/11/2014 1129   ALBUMIN 3.6 01/04/2014 0907   AST 26 03/11/2014 1129   AST 46* 01/04/2014 0907   ALT 33 03/11/2014 1129   ALT 70* 01/04/2014 0907   ALKPHOS 87 03/11/2014 1129   ALKPHOS 51 01/04/2014 0907   BILITOT 0.62 03/11/2014 1129   BILITOT 0.5 01/04/2014 0907   GFRNONAA >90 01/04/2014 0907   GFRAA >90 01/04/2014 0907    No results found for: SPEP, UPEP  Lab Results  Component Value Date   WBC 5.8 03/11/2014   NEUTROABS 4.0 03/11/2014   HGB 12.2* 03/11/2014   HCT 36.6*  03/11/2014   MCV 98.9* 03/11/2014   PLT 236 03/11/2014      Chemistry      Component Value Date/Time   NA 141 03/11/2014 1129   NA 137 01/04/2014 0907   K 4.3 03/11/2014 1129   K 4.1 01/04/2014 0907   CL 100 01/04/2014 0907   CO2 25 03/11/2014 1129   CO2 26 01/04/2014 0907   BUN 10.2 03/11/2014 1129   BUN 15 01/04/2014 0907   CREATININE 0.9 03/11/2014 1129   CREATININE 0.92 01/04/2014 0907   CREATININE 0.98 12/09/2013 0716      Component Value Date/Time   CALCIUM 9.5 03/11/2014 1129   CALCIUM 9.7 01/04/2014 0907   ALKPHOS 87 03/11/2014 1129   ALKPHOS 51 01/04/2014 0907   AST 26 03/11/2014 1129   AST 46* 01/04/2014 0907   ALT 33 03/11/2014 1129   ALT 70* 01/04/2014 0907   BILITOT 0.62 03/11/2014 1129   BILITOT 0.5 01/04/2014 0907       RADIOGRAPHIC STUDIES: I have personally reviewed the radiological images as listed and agreed with the findings in  the report. Nm Pet Image Restag (ps) Skull Base To Thigh  03/11/2014   CLINICAL DATA:  Subsequent treatment strategy for lymphoma.  EXAM: NUCLEAR MEDICINE PET SKULL BASE TO THIGH  TECHNIQUE: 13.8 mCi F-18 FDG was injected intravenously. Full-ring PET imaging was performed from the skull base to thigh after the radiotracer. CT data was obtained and used for attenuation correction and anatomic localization.  FASTING BLOOD GLUCOSE:  Value: 100 mg/dl  COMPARISON:  01/05/2014  FINDINGS: NECK  No hypermetabolic lymph nodes in the neck.  CHEST  No hypermetabolic mediastinal or hilar nodes. No suspicious pulmonary nodules on the CT scan. Stable emphysematous changes and pulmonary scarring. Dependent bibasilar atelectasis.  ABDOMEN/PELVIS  No abnormal hypermetabolic activity within the liver, pancreas, adrenal glands, or spleen. No hypermetabolic lymph nodes in the abdomen or pelvis. Complete metabolic response is demonstrated. No measurable adenopathy and no hypermetabolism in the area of previous lymphoma. Resolving postsurgical uptake in the  left groin area. Chronic diffuse bladder wall thickening. Possible chronic cystitis. The prostate gland is not significantly enlarged to suggest bladder outlet obstruction.  SKELETON  Diffuse osseous uptake likely due to rebound from chemotherapy or marrow stimulating drugs  IMPRESSION: Complete metabolic response to pelvic and left inguinal lymphadenopathy.   Electronically Signed   By: Kalman Jewels M.D.   On: 03/11/2014 14:27     ASSESSMENT & PLAN:  Diffuse large B cell lymphoma He is experiencing expected side effects from treatment. He had 50% dose adjustment of vincristine due to early signs of peripheral neuropathy after just 1 cycle of treatment. He is also not tolerating high-dose prednisone and a plan to reduce it to 60 mg instead of 100 mg. Recently, he had mucositis which he did not attribute to chemotherapy. It is not bothering him. We will proceed with treatment next week without delay. Repeat PET/CT scan showed near complete response to treatment.   Mucositis due to chemotherapy This is only mild grade 1. I recommend conservative management with baking soda mix.   Neuropathy due to chemotherapeutic drug His neuropathy has improved since we reduced the dose of vincristine. We will continue on the same dose this cycle.   Anemia in neoplastic disease This is likely due to recent treatment. The patient denies recent history of bleeding such as epistaxis, hematuria or hematochezia. He is asymptomatic from the anemia. I will observe for now.  He does not require transfusion now. I will continue the chemotherapy at current dose     No orders of the defined types were placed in this encounter.   All questions were answered. The patient knows to call the clinic with any problems, questions or concerns. No barriers to learning was detected. I spent 30 minutes counseling the patient face to face. The total time spent in the appointment was 40 minutes and more than 50% was on  counseling and review of test results     [facility], [place], MD 03/12/2014 12:44 PM

## 2014-03-12 NOTE — Assessment & Plan Note (Signed)
This is likely due to recent treatment. The patient denies recent history of bleeding such as epistaxis, hematuria or hematochezia. He is asymptomatic from the anemia. I will observe for now.  He does not require transfusion now. I will continue the chemotherapy at current dose

## 2014-03-12 NOTE — Assessment & Plan Note (Signed)
His neuropathy has improved since we reduced the dose of vincristine. We will continue on the same dose this cycle.

## 2014-03-15 ENCOUNTER — Encounter: Payer: Self-pay | Admitting: *Deleted

## 2014-03-15 NOTE — Progress Notes (Signed)
03/12/2014 1130 PREVENT study Patient brought in medication calendars for Dec. And Jan.  Patient has not missed any doses.  Patient denies having any side effects related to study drug including myalgias, muscle pain, respiratory problems, or other complaints.  Per MD, patient has no side effects related to study drug. [place] Nurse

## 2014-03-16 ENCOUNTER — Ambulatory Visit (HOSPITAL_BASED_OUTPATIENT_CLINIC_OR_DEPARTMENT_OTHER): Payer: BLUE CROSS/BLUE SHIELD

## 2014-03-16 DIAGNOSIS — C8330 Diffuse large B-cell lymphoma, unspecified site: Secondary | ICD-10-CM

## 2014-03-16 DIAGNOSIS — Z006 Encounter for examination for normal comparison and control in clinical research program: Secondary | ICD-10-CM

## 2014-03-16 DIAGNOSIS — Z5111 Encounter for antineoplastic chemotherapy: Secondary | ICD-10-CM

## 2014-03-16 MED ORDER — ACETAMINOPHEN 325 MG PO TABS
650.0000 mg | ORAL_TABLET | Freq: Once | ORAL | Status: AC
  Administered 2014-03-16: 650 mg via ORAL

## 2014-03-16 MED ORDER — DEXAMETHASONE SODIUM PHOSPHATE 20 MG/5ML IJ SOLN
20.0000 mg | Freq: Once | INTRAMUSCULAR | Status: AC
  Administered 2014-03-16: 20 mg via INTRAVENOUS

## 2014-03-16 MED ORDER — SODIUM CHLORIDE 0.9 % IV SOLN
375.0000 mg/m2 | Freq: Once | INTRAVENOUS | Status: AC
  Administered 2014-03-16: 900 mg via INTRAVENOUS
  Filled 2014-03-16: qty 90

## 2014-03-16 MED ORDER — SODIUM CHLORIDE 0.9 % IV SOLN
750.0000 mg/m2 | Freq: Once | INTRAVENOUS | Status: AC
  Administered 2014-03-16: 1840 mg via INTRAVENOUS
  Filled 2014-03-16: qty 92

## 2014-03-16 MED ORDER — SODIUM CHLORIDE 0.9 % IV SOLN
Freq: Once | INTRAVENOUS | Status: AC
  Administered 2014-03-16: 10:00:00 via INTRAVENOUS

## 2014-03-16 MED ORDER — VINCRISTINE SULFATE CHEMO INJECTION 1 MG/ML
1.0000 mg | Freq: Once | INTRAVENOUS | Status: AC
  Administered 2014-03-16: 1 mg via INTRAVENOUS
  Filled 2014-03-16: qty 1

## 2014-03-16 MED ORDER — DIPHENHYDRAMINE HCL 25 MG PO CAPS
50.0000 mg | ORAL_CAPSULE | Freq: Once | ORAL | Status: AC
  Administered 2014-03-16: 50 mg via ORAL

## 2014-03-16 MED ORDER — ONDANSETRON 16 MG/50ML IVPB (CHCC)
INTRAVENOUS | Status: AC
  Filled 2014-03-16: qty 16

## 2014-03-16 MED ORDER — DIPHENHYDRAMINE HCL 25 MG PO CAPS
ORAL_CAPSULE | ORAL | Status: AC
  Filled 2014-03-16: qty 2

## 2014-03-16 MED ORDER — SODIUM CHLORIDE 0.9 % IJ SOLN
10.0000 mL | INTRAMUSCULAR | Status: DC | PRN
  Administered 2014-03-16: 10 mL
  Filled 2014-03-16: qty 10

## 2014-03-16 MED ORDER — DOXORUBICIN HCL CHEMO IV INJECTION 2 MG/ML
50.0000 mg/m2 | Freq: Once | INTRAVENOUS | Status: AC
  Administered 2014-03-16: 124 mg via INTRAVENOUS
  Filled 2014-03-16: qty 62

## 2014-03-16 MED ORDER — ACETAMINOPHEN 325 MG PO TABS
ORAL_TABLET | ORAL | Status: AC
  Filled 2014-03-16: qty 2

## 2014-03-16 MED ORDER — ONDANSETRON 16 MG/50ML IVPB (CHCC)
16.0000 mg | Freq: Once | INTRAVENOUS | Status: AC
  Administered 2014-03-16: 16 mg via INTRAVENOUS

## 2014-03-16 MED ORDER — DEXAMETHASONE SODIUM PHOSPHATE 20 MG/5ML IJ SOLN
INTRAMUSCULAR | Status: AC
  Filled 2014-03-16: qty 5

## 2014-03-16 MED ORDER — HEPARIN SOD (PORK) LOCK FLUSH 100 UNIT/ML IV SOLN
500.0000 [IU] | Freq: Once | INTRAVENOUS | Status: AC | PRN
  Administered 2014-03-16: 500 [IU]
  Filled 2014-03-16: qty 5

## 2014-03-16 NOTE — Patient Instructions (Signed)
[place] Discharge Instructions for Patients Receiving Chemotherapy  Today you received the following chemotherapy agents Rituxan, Doxorubicin, Vincristine, and Cytoxan.  To help prevent nausea and vomiting after your treatment, we encourage you to take your nausea medication Compazine 10 mg every 6 hours or Zofran 8 my every 8 hours as needed.   If you develop nausea and vomiting that is not controlled by your nausea medication, call the clinic.   BELOW ARE SYMPTOMS THAT SHOULD BE REPORTED IMMEDIATELY:  *FEVER GREATER THAN 100.5 F  *CHILLS WITH OR WITHOUT FEVER  NAUSEA AND VOMITING THAT IS NOT CONTROLLED WITH YOUR NAUSEA MEDICATION  *UNUSUAL SHORTNESS OF BREATH  *UNUSUAL BRUISING OR BLEEDING  TENDERNESS IN MOUTH AND THROAT WITH OR WITHOUT PRESENCE OF ULCERS  *URINARY PROBLEMS  *BOWEL PROBLEMS  UNUSUAL RASH Items with * indicate a potential emergency and should be followed up as soon as possible.  Feel free to call the clinic you have any questions or concerns. The clinic phone number is [phone].

## 2014-03-17 ENCOUNTER — Ambulatory Visit (HOSPITAL_BASED_OUTPATIENT_CLINIC_OR_DEPARTMENT_OTHER): Payer: BLUE CROSS/BLUE SHIELD

## 2014-03-17 DIAGNOSIS — Z5189 Encounter for other specified aftercare: Secondary | ICD-10-CM

## 2014-03-17 DIAGNOSIS — C8330 Diffuse large B-cell lymphoma, unspecified site: Secondary | ICD-10-CM

## 2014-03-17 MED ORDER — PEGFILGRASTIM INJECTION 6 MG/0.6ML ~~LOC~~
6.0000 mg | PREFILLED_SYRINGE | Freq: Once | SUBCUTANEOUS | Status: AC
  Administered 2014-03-17: 6 mg via SUBCUTANEOUS
  Filled 2014-03-17: qty 0.6

## 2014-03-22 ENCOUNTER — Encounter: Payer: Self-pay | Admitting: Hematology and Oncology

## 2014-04-06 ENCOUNTER — Other Ambulatory Visit (HOSPITAL_BASED_OUTPATIENT_CLINIC_OR_DEPARTMENT_OTHER): Payer: BLUE CROSS/BLUE SHIELD

## 2014-04-06 ENCOUNTER — Telehealth: Payer: Self-pay | Admitting: *Deleted

## 2014-04-06 ENCOUNTER — Encounter: Payer: Self-pay | Admitting: *Deleted

## 2014-04-06 ENCOUNTER — Other Ambulatory Visit: Payer: Self-pay | Admitting: *Deleted

## 2014-04-06 ENCOUNTER — Encounter: Payer: Self-pay | Admitting: Hematology and Oncology

## 2014-04-06 ENCOUNTER — Telehealth: Payer: Self-pay | Admitting: Hematology and Oncology

## 2014-04-06 ENCOUNTER — Ambulatory Visit: Payer: BLUE CROSS/BLUE SHIELD

## 2014-04-06 ENCOUNTER — Ambulatory Visit (HOSPITAL_BASED_OUTPATIENT_CLINIC_OR_DEPARTMENT_OTHER): Payer: BLUE CROSS/BLUE SHIELD | Admitting: Hematology and Oncology

## 2014-04-06 ENCOUNTER — Ambulatory Visit (HOSPITAL_BASED_OUTPATIENT_CLINIC_OR_DEPARTMENT_OTHER): Payer: BLUE CROSS/BLUE SHIELD

## 2014-04-06 VITALS — BP 113/78 | HR 81 | Temp 98.4°F | Resp 18 | Ht 72.0 in | Wt 271.4 lb

## 2014-04-06 DIAGNOSIS — Z006 Encounter for examination for normal comparison and control in clinical research program: Secondary | ICD-10-CM

## 2014-04-06 DIAGNOSIS — C8330 Diffuse large B-cell lymphoma, unspecified site: Secondary | ICD-10-CM

## 2014-04-06 DIAGNOSIS — Z95828 Presence of other vascular implants and grafts: Secondary | ICD-10-CM

## 2014-04-06 DIAGNOSIS — Z5112 Encounter for antineoplastic immunotherapy: Secondary | ICD-10-CM

## 2014-04-06 DIAGNOSIS — T451X5A Adverse effect of antineoplastic and immunosuppressive drugs, initial encounter: Secondary | ICD-10-CM

## 2014-04-06 DIAGNOSIS — D630 Anemia in neoplastic disease: Secondary | ICD-10-CM

## 2014-04-06 DIAGNOSIS — F1020 Alcohol dependence, uncomplicated: Secondary | ICD-10-CM

## 2014-04-06 DIAGNOSIS — G620 Drug-induced polyneuropathy: Secondary | ICD-10-CM

## 2014-04-06 LAB — COMPREHENSIVE METABOLIC PANEL (CC13)
ALBUMIN: 3.5 g/dL (ref 3.5–5.0)
ALT: 32 U/L (ref 0–55)
ANION GAP: 13 meq/L — AB (ref 3–11)
AST: 26 U/L (ref 5–34)
Alkaline Phosphatase: 72 U/L (ref 40–150)
BUN: 13.6 mg/dL (ref 7.0–26.0)
CALCIUM: 9.3 mg/dL (ref 8.4–10.4)
CHLORIDE: 104 meq/L (ref 98–109)
CO2: 26 meq/L (ref 22–29)
Creatinine: 0.9 mg/dL (ref 0.7–1.3)
Glucose: 169 mg/dl — ABNORMAL HIGH (ref 70–140)
Potassium: 3.9 mEq/L (ref 3.5–5.1)
Sodium: 142 mEq/L (ref 136–145)
Total Bilirubin: 0.44 mg/dL (ref 0.20–1.20)
Total Protein: 6.1 g/dL — ABNORMAL LOW (ref 6.4–8.3)

## 2014-04-06 LAB — CBC WITH DIFFERENTIAL/PLATELET
BASO%: 1.4 % (ref 0.0–2.0)
BASOS ABS: 0.1 10*3/uL (ref 0.0–0.1)
EOS ABS: 0 10*3/uL (ref 0.0–0.5)
EOS%: 0.1 % (ref 0.0–7.0)
HEMATOCRIT: 36.4 % — AB (ref 38.4–49.9)
HEMOGLOBIN: 12.3 g/dL — AB (ref 13.0–17.1)
LYMPH#: 0.7 10*3/uL — AB (ref 0.9–3.3)
LYMPH%: 12.7 % — ABNORMAL LOW (ref 14.0–49.0)
MCH: 34.4 pg — AB (ref 27.2–33.4)
MCHC: 33.7 g/dL (ref 32.0–36.0)
MCV: 102 fL — ABNORMAL HIGH (ref 79.3–98.0)
MONO#: 0.8 10*3/uL (ref 0.1–0.9)
MONO%: 14.6 % — ABNORMAL HIGH (ref 0.0–14.0)
NEUT#: 3.8 10*3/uL (ref 1.5–6.5)
NEUT%: 71.2 % (ref 39.0–75.0)
Platelets: 253 10*3/uL (ref 140–400)
RBC: 3.57 10*6/uL — ABNORMAL LOW (ref 4.20–5.82)
RDW: 16.2 % — ABNORMAL HIGH (ref 11.0–14.6)
WBC: 5.3 10*3/uL (ref 4.0–10.3)

## 2014-04-06 LAB — RESEARCH LABS

## 2014-04-06 MED ORDER — DEXAMETHASONE SODIUM PHOSPHATE 20 MG/5ML IJ SOLN
20.0000 mg | Freq: Once | INTRAMUSCULAR | Status: AC
  Administered 2014-04-06: 20 mg via INTRAVENOUS

## 2014-04-06 MED ORDER — ACETAMINOPHEN 325 MG PO TABS
650.0000 mg | ORAL_TABLET | Freq: Once | ORAL | Status: AC
  Administered 2014-04-06: 650 mg via ORAL

## 2014-04-06 MED ORDER — ONDANSETRON 16 MG/50ML IVPB (CHCC)
INTRAVENOUS | Status: AC
Start: 2014-04-06 — End: 2014-04-06
  Filled 2014-04-06: qty 16

## 2014-04-06 MED ORDER — ACETAMINOPHEN 325 MG PO TABS
ORAL_TABLET | ORAL | Status: AC
  Filled 2014-04-06: qty 2

## 2014-04-06 MED ORDER — HEPARIN SOD (PORK) LOCK FLUSH 100 UNIT/ML IV SOLN
500.0000 [IU] | Freq: Once | INTRAVENOUS | Status: AC | PRN
  Administered 2014-04-06: 500 [IU]
  Filled 2014-04-06: qty 5

## 2014-04-06 MED ORDER — ONDANSETRON 16 MG/50ML IVPB (CHCC)
16.0000 mg | Freq: Once | INTRAVENOUS | Status: AC
  Administered 2014-04-06: 16 mg via INTRAVENOUS

## 2014-04-06 MED ORDER — SODIUM CHLORIDE 0.9 % IJ SOLN
10.0000 mL | INTRAMUSCULAR | Status: DC | PRN
  Administered 2014-04-06: 10 mL
  Filled 2014-04-06: qty 10

## 2014-04-06 MED ORDER — DEXAMETHASONE SODIUM PHOSPHATE 20 MG/5ML IJ SOLN
INTRAMUSCULAR | Status: AC
  Filled 2014-04-06: qty 5

## 2014-04-06 MED ORDER — SODIUM CHLORIDE 0.9 % IV SOLN
Freq: Once | INTRAVENOUS | Status: AC
  Administered 2014-04-06: 11:00:00 via INTRAVENOUS

## 2014-04-06 MED ORDER — SODIUM CHLORIDE 0.9 % IJ SOLN
10.0000 mL | INTRAMUSCULAR | Status: DC | PRN
  Administered 2014-04-06: 10 mL via INTRAVENOUS
  Filled 2014-04-06: qty 10

## 2014-04-06 MED ORDER — DIPHENHYDRAMINE HCL 25 MG PO CAPS
50.0000 mg | ORAL_CAPSULE | Freq: Once | ORAL | Status: AC
  Administered 2014-04-06: 50 mg via ORAL

## 2014-04-06 MED ORDER — CYCLOPHOSPHAMIDE CHEMO INJECTION 1 GM
750.0000 mg/m2 | Freq: Once | INTRAMUSCULAR | Status: AC
  Administered 2014-04-06: 1840 mg via INTRAVENOUS
  Filled 2014-04-06: qty 92

## 2014-04-06 MED ORDER — DIPHENHYDRAMINE HCL 25 MG PO CAPS
ORAL_CAPSULE | ORAL | Status: AC
Start: 2014-04-06 — End: 2014-04-06
  Filled 2014-04-06: qty 2

## 2014-04-06 MED ORDER — SODIUM CHLORIDE 0.9 % IV SOLN
375.0000 mg/m2 | Freq: Once | INTRAVENOUS | Status: AC
  Administered 2014-04-06: 900 mg via INTRAVENOUS
  Filled 2014-04-06: qty 90

## 2014-04-06 MED ORDER — DOXORUBICIN HCL CHEMO IV INJECTION 2 MG/ML
50.0000 mg/m2 | Freq: Once | INTRAVENOUS | Status: AC
  Administered 2014-04-06: 124 mg via INTRAVENOUS
  Filled 2014-04-06: qty 62

## 2014-04-06 MED ORDER — VINCRISTINE SULFATE CHEMO INJECTION 1 MG/ML
1.0000 mg | Freq: Once | INTRAVENOUS | Status: AC
  Administered 2014-04-06: 1 mg via INTRAVENOUS
  Filled 2014-04-06: qty 1

## 2014-04-06 NOTE — Assessment & Plan Note (Signed)
The patient is recommended to quit alcohol intake because this will interact with his chemotherapy. The patient is still consuming some alcohol

## 2014-04-06 NOTE — Telephone Encounter (Signed)
Per staff message and POF I have scheduled appts. Advised scheduler of appts. JMW  

## 2014-04-06 NOTE — Progress Notes (Signed)
[place] OFFICE PROGRESS NOTE  Patient Care Team: Mikey Kirschner, MD as PCP - General (Family Medicine)  SUMMARY OF ONCOLOGIC HISTORY: Oncology History   Diffuse large B cell lymphoma   Staging form: Lymphoid Neoplasms, AJCC 6th Edition     Clinical stage from 12/30/2013: Stage III - Signed by Heath Lark, MD on 01/08/2014     Pathologic: No stage assigned - Unsigned       Diffuse large B cell lymphoma   12/14/2013 Imaging CT scan of the abdomen and pelvis showed bulky left iliac chain and left inguinal adenopathy, highly worrisome for lymphoma.   12/22/2013 Surgery He underwent excisional lymph node biopsy of the left inguinal region that confirm lymphoma diagnosis. No   12/22/2013 Pathology Results Accession: [id number] pathology showed a high-grade follicular lymphoma with possibly foci of diffuse large B-cell lymphoma   01/04/2014 Bone Marrow Biopsy BM biopsy was negative with normal cytogenetics   01/05/2014 Imaging PET CT scan showed lymph nodes involvement of both sides of her diaphragm   01/06/2014 Surgery He has port placed   01/07/2014 Imaging ECHO showed preserved EF of 73%, mildly dilated atrium   01/12/2014 -  Chemotherapy He is started on R CHOP chemotherapy   02/02/2014 Adverse Reaction Vincristine dose was reduced by 50% due to early signs of peripheral neuropathy   03/11/2014 Imaging Repeat PET scan showed near complete response to treatment    INTERVAL HISTORY: Please see below for problem oriented charting. He is seen prior to cycle 5 of treatment. His symptoms are about the same. He has fatigue after treatment. He had very mild mucositis, resolved with conservative management. Peripheral neuropathy is about the same.  REVIEW OF SYSTEMS:   Constitutional: Denies fevers, chills or abnormal weight loss Eyes: Denies blurriness of vision Ears, nose, mouth, throat, and face: Denies mucositis or sore throat Respiratory: Denies cough, dyspnea or  wheezes Cardiovascular: Denies palpitation, chest discomfort or lower extremity swelling Gastrointestinal:  Denies nausea, heartburn or change in bowel habits Skin: Denies abnormal skin rashes Lymphatics: Denies new lymphadenopathy or easy bruising Neurological:Denies numbness, tingling or new weaknesses Behavioral/Psych: Mood is stable, no new changes  All other systems were reviewed with the patient and are negative.  I have reviewed the past medical history, past surgical history, social history and family history with the patient and they are unchanged from previous note.  ALLERGIES:  has No Known Allergies.  MEDICATIONS:  Current Outpatient Prescriptions  Medication Sig Dispense Refill  . Atorvastatin Calcium (INVESTIGATIONAL ATORVASTATIN/PLACEBO) 40 MG tablet [facility] [id number] Take 1 tablet by mouth daily. Take 2 doses (these doses must be 12 hours apart) prior to first chemotherapy treatment. Then take 1 tablet daily by mouth with or without food. 180 tablet 0  . enalapril (VASOTEC) 20 MG tablet Take 1 tablet (20 mg total) by mouth at bedtime. 90 tablet 3  . lidocaine-prilocaine (EMLA) cream Apply to affected area once 30 g 3  . ondansetron (ZOFRAN) 8 MG tablet Take 1 tablet (8 mg total) by mouth every 8 (eight) hours as needed for nausea. 30 tablet 1  . predniSONE (DELTASONE) 20 MG tablet Take 3 tablets (60 mg total) by mouth daily with breakfast. Take on days 2-5 of chemotherapy every 21 days 60 tablet 0  . prochlorperazine (COMPAZINE) 10 MG tablet Take 1 tablet (10 mg total) by mouth every 6 (six) hours as needed (Nausea or vomiting). 30 tablet 6   No current facility-administered medications for this visit.  Facility-Administered Medications Ordered in Other Visits  Medication Dose Route Frequency Provider Last Rate Last Dose  . cyclophosphamide (CYTOXAN) 1,840 mg in sodium chloride 0.9 % 250 mL chemo infusion  750 mg/m2 (Treatment Plan Actual) Intravenous Once Heath Lark,  MD 684 mL/hr at 04/06/14 1228 1,840 mg at 04/06/14 1228  . heparin lock flush 100 unit/mL  500 Units Intracatheter Once PRN Heath Lark, MD      . riTUXimab (RITUXAN) 900 mg in sodium chloride 0.9 % 160 mL chemo infusion  375 mg/m2 (Treatment Plan Actual) Intravenous Once Heath Lark, MD      . sodium chloride 0.9 % injection 10 mL  10 mL Intracatheter PRN Heath Lark, MD        PHYSICAL EXAMINATION: ECOG PERFORMANCE STATUS: 0 - Asymptomatic  Filed Vitals:   04/06/14 1044  BP: 113/78  Pulse: 81  Temp: 98.4 F (36.9 C)  Resp: 18   Filed Weights   04/06/14 1044  Weight: 271 lb 6.4 oz (123.106 kg)    GENERAL:alert, no distress and comfortable SKIN: skin color, texture, turgor are normal, no rashes or significant lesions EYES: normal, Conjunctiva are pink and non-injected, sclera clear OROPHARYNX:no exudate, no erythema and lips, buccal mucosa, and tongue normal  NECK: supple, thyroid normal size, non-tender, without nodularity LYMPH:  no palpable lymphadenopathy in the cervical, axillary or inguinal LUNGS: clear to auscultation and percussion with normal breathing effort HEART: regular rate & rhythm and no murmurs and no lower extremity edema ABDOMEN:abdomen soft, non-tender and normal bowel sounds Musculoskeletal:no cyanosis of digits and no clubbing  NEURO: alert & oriented x 3 with fluent speech, no focal motor/sensory deficits  LABORATORY DATA:  I have reviewed the data as listed    Component Value Date/Time   NA 142 04/06/2014 0958   NA 137 01/04/2014 0907   K 3.9 04/06/2014 0958   K 4.1 01/04/2014 0907   CL 100 01/04/2014 0907   CO2 26 04/06/2014 0958   CO2 26 01/04/2014 0907   GLUCOSE 169* 04/06/2014 0958   GLUCOSE 112* 01/04/2014 0907   BUN 13.6 04/06/2014 0958   BUN 15 01/04/2014 0907   CREATININE 0.9 04/06/2014 0958   CREATININE 0.92 01/04/2014 0907   CREATININE 0.98 12/09/2013 0716   CALCIUM 9.3 04/06/2014 0958   CALCIUM 9.7 01/04/2014 0907   PROT 6.1*  04/06/2014 0958   PROT 7.0 01/04/2014 0907   ALBUMIN 3.5 04/06/2014 0958   ALBUMIN 3.6 01/04/2014 0907   AST 26 04/06/2014 0958   AST 46* 01/04/2014 0907   ALT 32 04/06/2014 0958   ALT 70* 01/04/2014 0907   ALKPHOS 72 04/06/2014 0958   ALKPHOS 51 01/04/2014 0907   BILITOT 0.44 04/06/2014 0958   BILITOT 0.5 01/04/2014 0907   GFRNONAA >90 01/04/2014 0907   GFRAA >90 01/04/2014 0907    No results found for: SPEP, UPEP  Lab Results  Component Value Date   WBC 5.3 04/06/2014   NEUTROABS 3.8 04/06/2014   HGB 12.3* 04/06/2014   HCT 36.4* 04/06/2014   MCV 102.0* 04/06/2014   PLT 253 04/06/2014      Chemistry      Component Value Date/Time   NA 142 04/06/2014 0958   NA 137 01/04/2014 0907   K 3.9 04/06/2014 0958   K 4.1 01/04/2014 0907   CL 100 01/04/2014 0907   CO2 26 04/06/2014 0958   CO2 26 01/04/2014 0907   BUN 13.6 04/06/2014 0958   BUN 15 01/04/2014 0907   CREATININE 0.9  04/06/2014 0958   CREATININE 0.92 01/04/2014 0907   CREATININE 0.98 12/09/2013 0716      Component Value Date/Time   CALCIUM 9.3 04/06/2014 0958   CALCIUM 9.7 01/04/2014 0907   ALKPHOS 72 04/06/2014 0958   ALKPHOS 51 01/04/2014 0907   AST 26 04/06/2014 0958   AST 46* 01/04/2014 0907   ALT 32 04/06/2014 0958   ALT 70* 01/04/2014 0907   BILITOT 0.44 04/06/2014 0958   BILITOT 0.5 01/04/2014 0907      ASSESSMENT & PLAN:  Diffuse large B cell lymphoma He is experiencing expected side effects from treatment. He had 50% dose adjustment of vincristine due to early signs of peripheral neuropathy after just 1 cycle of treatment. He is also not tolerating high-dose prednisone and a plan to reduce it to 60 mg instead of 100 mg. Recently, he had mucositis which he did not attribute to chemotherapy. It is not bothering him. We will proceed with treatment without delay. Repeat PET/CT scan recently showed near complete response to treatment.   Anemia in neoplastic disease This is likely due to recent  treatment. The patient denies recent history of bleeding such as epistaxis, hematuria or hematochezia. He is asymptomatic from the anemia. I will observe for now.  He does not require transfusion now. I will continue the chemotherapy at current dose    Chronic alcoholism The patient is recommended to quit alcohol intake because this will interact with his chemotherapy. The patient is still consuming some alcohol       Neuropathy due to chemotherapeutic drug His neuropathy has improved since we reduced the dose of vincristine. We will continue on the same dose this cycle.    No orders of the defined types were placed in this encounter.   All questions were answered. The patient knows to call the clinic with any problems, questions or concerns. No barriers to learning was detected. I spent 25 minutes counseling the patient face to face. The total time spent in the appointment was 30 minutes and more than 50% was on counseling and review of test results     [facility], [place], MD 04/06/2014 12:45 PM

## 2014-04-06 NOTE — Telephone Encounter (Signed)
Pt confirmed labs/ov per 03/01 POF, gave pt AVS... KJ, sent msg to add RCHOP.Marland KitchenMarland KitchenMarland Kitchen

## 2014-04-06 NOTE — Progress Notes (Signed)
04/06/2014  [place] study month 3 visit Met with patient to assess for side effects and medication compliance. Patient reports having no side effects related to the study drug. He denies abd. pain, constipation, nausea, upper resp. Infection, or muscle aches and pain. Patient reports that he has taken his medication every day and has been keeping up with his medication calendars. Research labs drawn today for AST/ALT, and CK. Per MD, no known side effects at this time from study medication. Marcellus Scott, RN Clinical Research

## 2014-04-06 NOTE — Patient Instructions (Signed)

## 2014-04-06 NOTE — Assessment & Plan Note (Signed)
This is likely due to recent treatment. The patient denies recent history of bleeding such as epistaxis, hematuria or hematochezia. He is asymptomatic from the anemia. I will observe for now.  He does not require transfusion now. I will continue the chemotherapy at current dose

## 2014-04-06 NOTE — Patient Instructions (Signed)
[facility] Discharge Instructions for Patients Receiving Chemotherapy  Today you received the following chemotherapy agents Adriamycin/Vincristine/Cytoxan/Rituxan.  To help prevent nausea and vomiting after your treatment, we encourage you to take your nausea medication as prescribed.   If you develop nausea and vomiting that is not controlled by your nausea medication, call the clinic.   BELOW ARE SYMPTOMS THAT SHOULD BE REPORTED IMMEDIATELY:  *FEVER GREATER THAN 100.5 F  *CHILLS WITH OR WITHOUT FEVER  NAUSEA AND VOMITING THAT IS NOT CONTROLLED WITH YOUR NAUSEA MEDICATION  *UNUSUAL SHORTNESS OF BREATH  *UNUSUAL BRUISING OR BLEEDING  TENDERNESS IN MOUTH AND THROAT WITH OR WITHOUT PRESENCE OF ULCERS  *URINARY PROBLEMS  *BOWEL PROBLEMS  UNUSUAL RASH Items with * indicate a potential emergency and should be followed up as soon as possible.  Feel free to call the clinic you have any questions or concerns. The clinic phone number is [phone].    

## 2014-04-06 NOTE — Assessment & Plan Note (Signed)
His neuropathy has improved since we reduced the dose of vincristine. We will continue on the same dose this cycle.

## 2014-04-06 NOTE — Assessment & Plan Note (Signed)
He is experiencing expected side effects from treatment. He had 50% dose adjustment of vincristine due to early signs of peripheral neuropathy after just 1 cycle of treatment. He is also not tolerating high-dose prednisone and a plan to reduce it to 60 mg instead of 100 mg. Recently, he had mucositis which he did not attribute to chemotherapy. It is not bothering him. We will proceed with treatment without delay. Repeat PET/CT scan recently showed near complete response to treatment.

## 2014-04-07 ENCOUNTER — Ambulatory Visit (HOSPITAL_BASED_OUTPATIENT_CLINIC_OR_DEPARTMENT_OTHER): Payer: BLUE CROSS/BLUE SHIELD

## 2014-04-07 DIAGNOSIS — C8330 Diffuse large B-cell lymphoma, unspecified site: Secondary | ICD-10-CM

## 2014-04-07 DIAGNOSIS — Z5189 Encounter for other specified aftercare: Secondary | ICD-10-CM

## 2014-04-07 MED ORDER — PEGFILGRASTIM INJECTION 6 MG/0.6ML ~~LOC~~
6.0000 mg | PREFILLED_SYRINGE | Freq: Once | SUBCUTANEOUS | Status: AC
  Administered 2014-04-07: 6 mg via SUBCUTANEOUS
  Filled 2014-04-07: qty 0.6

## 2014-04-27 ENCOUNTER — Other Ambulatory Visit: Payer: BLUE CROSS/BLUE SHIELD

## 2014-04-27 ENCOUNTER — Other Ambulatory Visit (HOSPITAL_BASED_OUTPATIENT_CLINIC_OR_DEPARTMENT_OTHER): Payer: BLUE CROSS/BLUE SHIELD

## 2014-04-27 ENCOUNTER — Ambulatory Visit: Payer: BLUE CROSS/BLUE SHIELD

## 2014-04-27 ENCOUNTER — Ambulatory Visit (HOSPITAL_BASED_OUTPATIENT_CLINIC_OR_DEPARTMENT_OTHER): Payer: BLUE CROSS/BLUE SHIELD

## 2014-04-27 ENCOUNTER — Ambulatory Visit: Payer: BLUE CROSS/BLUE SHIELD | Admitting: Hematology

## 2014-04-27 ENCOUNTER — Telehealth: Payer: Self-pay | Admitting: Hematology and Oncology

## 2014-04-27 ENCOUNTER — Ambulatory Visit (HOSPITAL_BASED_OUTPATIENT_CLINIC_OR_DEPARTMENT_OTHER): Payer: BLUE CROSS/BLUE SHIELD | Admitting: Hematology and Oncology

## 2014-04-27 VITALS — BP 123/69 | HR 85 | Temp 98.0°F | Resp 18 | Wt 272.9 lb

## 2014-04-27 DIAGNOSIS — Z5111 Encounter for antineoplastic chemotherapy: Secondary | ICD-10-CM

## 2014-04-27 DIAGNOSIS — C8330 Diffuse large B-cell lymphoma, unspecified site: Secondary | ICD-10-CM | POA: Diagnosis not present

## 2014-04-27 DIAGNOSIS — Z5112 Encounter for antineoplastic immunotherapy: Secondary | ICD-10-CM | POA: Diagnosis not present

## 2014-04-27 DIAGNOSIS — K1231 Oral mucositis (ulcerative) due to antineoplastic therapy: Secondary | ICD-10-CM

## 2014-04-27 DIAGNOSIS — Z95828 Presence of other vascular implants and grafts: Secondary | ICD-10-CM

## 2014-04-27 DIAGNOSIS — G620 Drug-induced polyneuropathy: Secondary | ICD-10-CM | POA: Diagnosis not present

## 2014-04-27 DIAGNOSIS — F1020 Alcohol dependence, uncomplicated: Secondary | ICD-10-CM

## 2014-04-27 DIAGNOSIS — D630 Anemia in neoplastic disease: Secondary | ICD-10-CM

## 2014-04-27 LAB — CBC WITH DIFFERENTIAL/PLATELET
BASO%: 0.7 % (ref 0.0–2.0)
BASOS ABS: 0 10*3/uL (ref 0.0–0.1)
EOS%: 0.4 % (ref 0.0–7.0)
Eosinophils Absolute: 0 10*3/uL (ref 0.0–0.5)
HEMATOCRIT: 37.5 % — AB (ref 38.4–49.9)
HGB: 12.8 g/dL — ABNORMAL LOW (ref 13.0–17.1)
LYMPH%: 10.2 % — ABNORMAL LOW (ref 14.0–49.0)
MCH: 35.2 pg — AB (ref 27.2–33.4)
MCHC: 34.1 g/dL (ref 32.0–36.0)
MCV: 103 fL — AB (ref 79.3–98.0)
MONO#: 0.7 10*3/uL (ref 0.1–0.9)
MONO%: 12.8 % (ref 0.0–14.0)
NEUT#: 4.1 10*3/uL (ref 1.5–6.5)
NEUT%: 75.9 % — AB (ref 39.0–75.0)
Platelets: 218 10*3/uL (ref 140–400)
RBC: 3.64 10*6/uL — ABNORMAL LOW (ref 4.20–5.82)
RDW: 14.4 % (ref 11.0–14.6)
WBC: 5.4 10*3/uL (ref 4.0–10.3)
lymph#: 0.6 10*3/uL — ABNORMAL LOW (ref 0.9–3.3)

## 2014-04-27 LAB — COMPREHENSIVE METABOLIC PANEL (CC13)
ALK PHOS: 73 U/L (ref 40–150)
ALT: 32 U/L (ref 0–55)
AST: 23 U/L (ref 5–34)
Albumin: 3.5 g/dL (ref 3.5–5.0)
Anion Gap: 11 mEq/L (ref 3–11)
BILIRUBIN TOTAL: 0.58 mg/dL (ref 0.20–1.20)
BUN: 16.7 mg/dL (ref 7.0–26.0)
CALCIUM: 9.3 mg/dL (ref 8.4–10.4)
CO2: 24 mEq/L (ref 22–29)
Chloride: 105 mEq/L (ref 98–109)
Creatinine: 1.1 mg/dL (ref 0.7–1.3)
EGFR: 75 mL/min/{1.73_m2} — ABNORMAL LOW (ref >90)
Glucose: 183 mg/dl — ABNORMAL HIGH (ref 70–140)
Potassium: 4 mEq/L (ref 3.5–5.1)
Sodium: 140 mEq/L (ref 136–145)
Total Protein: 6.2 g/dL — ABNORMAL LOW (ref 6.4–8.3)

## 2014-04-27 MED ORDER — ACETAMINOPHEN 325 MG PO TABS
650.0000 mg | ORAL_TABLET | Freq: Once | ORAL | Status: AC
  Administered 2014-04-27: 650 mg via ORAL

## 2014-04-27 MED ORDER — ACETAMINOPHEN 325 MG PO TABS
ORAL_TABLET | ORAL | Status: AC
  Filled 2014-04-27: qty 2

## 2014-04-27 MED ORDER — VINCRISTINE SULFATE CHEMO INJECTION 1 MG/ML
1.0000 mg | Freq: Once | INTRAVENOUS | Status: AC
  Administered 2014-04-27: 1 mg via INTRAVENOUS
  Filled 2014-04-27: qty 1

## 2014-04-27 MED ORDER — SODIUM CHLORIDE 0.9 % IV SOLN
Freq: Once | INTRAVENOUS | Status: AC
  Administered 2014-04-27: 09:00:00 via INTRAVENOUS

## 2014-04-27 MED ORDER — SODIUM CHLORIDE 0.9 % IJ SOLN
10.0000 mL | INTRAMUSCULAR | Status: DC | PRN
  Administered 2014-04-27: 10 mL
  Filled 2014-04-27: qty 10

## 2014-04-27 MED ORDER — SODIUM CHLORIDE 0.9 % IV SOLN
Freq: Once | INTRAVENOUS | Status: AC
  Administered 2014-04-27: 09:00:00 via INTRAVENOUS
  Filled 2014-04-27: qty 8

## 2014-04-27 MED ORDER — CYCLOPHOSPHAMIDE CHEMO INJECTION 1 GM
750.0000 mg/m2 | Freq: Once | INTRAMUSCULAR | Status: AC
  Administered 2014-04-27: 1840 mg via INTRAVENOUS
  Filled 2014-04-27: qty 92

## 2014-04-27 MED ORDER — SODIUM CHLORIDE 0.9 % IJ SOLN
10.0000 mL | INTRAMUSCULAR | Status: DC | PRN
  Administered 2014-04-27: 10 mL via INTRAVENOUS
  Filled 2014-04-27: qty 10

## 2014-04-27 MED ORDER — DIPHENHYDRAMINE HCL 25 MG PO CAPS
50.0000 mg | ORAL_CAPSULE | Freq: Once | ORAL | Status: AC
Start: 2014-04-27 — End: 2014-04-27
  Administered 2014-04-27: 50 mg via ORAL

## 2014-04-27 MED ORDER — DOXORUBICIN HCL CHEMO IV INJECTION 2 MG/ML
50.0000 mg/m2 | Freq: Once | INTRAVENOUS | Status: AC
  Administered 2014-04-27: 124 mg via INTRAVENOUS
  Filled 2014-04-27: qty 62

## 2014-04-27 MED ORDER — HEPARIN SOD (PORK) LOCK FLUSH 100 UNIT/ML IV SOLN
500.0000 [IU] | Freq: Once | INTRAVENOUS | Status: AC | PRN
  Administered 2014-04-27: 500 [IU]
  Filled 2014-04-27: qty 5

## 2014-04-27 MED ORDER — SODIUM CHLORIDE 0.9 % IV SOLN
375.0000 mg/m2 | Freq: Once | INTRAVENOUS | Status: AC
  Administered 2014-04-27: 900 mg via INTRAVENOUS
  Filled 2014-04-27: qty 90

## 2014-04-27 MED ORDER — DIPHENHYDRAMINE HCL 25 MG PO CAPS
ORAL_CAPSULE | ORAL | Status: AC
  Filled 2014-04-27: qty 2

## 2014-04-27 NOTE — Progress Notes (Signed)
[facility] OFFICE PROGRESS NOTE  Patient Care Team: Mikey Kirschner, MD as PCP - General (Family Medicine)  SUMMARY OF ONCOLOGIC HISTORY: Oncology History   Diffuse large B cell lymphoma   Staging form: Lymphoid Neoplasms, AJCC 6th Edition     Clinical stage from 12/30/2013: Stage III - Signed by Heath Lark, MD on 01/08/2014     Pathologic: No stage assigned - Unsigned       Diffuse large B cell lymphoma   12/14/2013 Imaging CT scan of the abdomen and pelvis showed bulky left iliac chain and left inguinal adenopathy, highly worrisome for lymphoma.   12/22/2013 Surgery He underwent excisional lymph node biopsy of the left inguinal region that confirm lymphoma diagnosis. No   12/22/2013 Pathology Results Accession: [id number] pathology showed a high-grade follicular lymphoma with possibly foci of diffuse large B-cell lymphoma   01/04/2014 Bone Marrow Biopsy BM biopsy was negative with normal cytogenetics   01/05/2014 Imaging PET CT scan showed lymph nodes involvement of both sides of her diaphragm   01/06/2014 Surgery He has port placed   01/07/2014 Imaging ECHO showed preserved EF of 73%, mildly dilated atrium   01/12/2014 - 04/27/2014 Chemotherapy He is received 6 cycles of R CHOP chemotherapy   02/02/2014 Adverse Reaction Vincristine dose was reduced by 50% due to early signs of peripheral neuropathy   03/11/2014 Imaging Repeat PET scan showed near complete response to treatment    INTERVAL HISTORY: Please see below for problem oriented charting. He is seen prior to cycle 6 of treatment. He feels well. He has very mild peripheral neuropathy and mucositis, but they do not bother him.  REVIEW OF SYSTEMS:   Constitutional: Denies fevers, chills or abnormal weight loss Eyes: Denies blurriness of vision Ears, nose, mouth, throat, and face: Denies mucositis or sore throat Respiratory: Denies cough, dyspnea or wheezes Cardiovascular: Denies palpitation, chest discomfort or  lower extremity swelling Gastrointestinal:  Denies nausea, heartburn or change in bowel habits Skin: Denies abnormal skin rashes Lymphatics: Denies new lymphadenopathy or easy bruising Neurological:Denies numbness, tingling or new weaknesses Behavioral/Psych: Mood is stable, no new changes  All other systems were reviewed with the patient and are negative.  I have reviewed the past medical history, past surgical history, social history and family history with the patient and they are unchanged from previous note.  ALLERGIES:  has No Known Allergies.  MEDICATIONS:  Current Outpatient Prescriptions  Medication Sig Dispense Refill  . Atorvastatin Calcium (INVESTIGATIONAL ATORVASTATIN/PLACEBO) 40 MG tablet [facility] [id number] Take 1 tablet by mouth daily. Take 2 doses (these doses must be 12 hours apart) prior to first chemotherapy treatment. Then take 1 tablet daily by mouth with or without food. 180 tablet 0  . enalapril (VASOTEC) 20 MG tablet Take 1 tablet (20 mg total) by mouth at bedtime. 90 tablet 3  . lidocaine-prilocaine (EMLA) cream Apply to affected area once 30 g 3  . ondansetron (ZOFRAN) 8 MG tablet Take 1 tablet (8 mg total) by mouth every 8 (eight) hours as needed for nausea. 30 tablet 1  . predniSONE (DELTASONE) 20 MG tablet Take 3 tablets (60 mg total) by mouth daily with breakfast. Take on days 2-5 of chemotherapy every 21 days 60 tablet 0  . prochlorperazine (COMPAZINE) 10 MG tablet Take 1 tablet (10 mg total) by mouth every 6 (six) hours as needed (Nausea or vomiting). 30 tablet 6   No current facility-administered medications for this visit.   Facility-Administered Medications Ordered in Other  Visits  Medication Dose Route Frequency Provider Last Rate Last Dose  . cyclophosphamide (CYTOXAN) 1,840 mg in sodium chloride 0.9 % 250 mL chemo infusion  750 mg/m2 (Treatment Plan Actual) Intravenous Once Heath Lark, MD      . heparin lock flush 100 unit/mL  500 Units Intracatheter  Once PRN Heath Lark, MD      . riTUXimab (RITUXAN) 900 mg in sodium chloride 0.9 % 160 mL chemo infusion  375 mg/m2 (Treatment Plan Actual) Intravenous Once Heath Lark, MD      . sodium chloride 0.9 % injection 10 mL  10 mL Intracatheter PRN Heath Lark, MD      . vinCRIStine (ONCOVIN) 1 mg in sodium chloride 0.9 % 50 mL chemo infusion  1 mg Intravenous Once Heath Lark, MD   1 mg at 04/27/14 1001    PHYSICAL EXAMINATION: ECOG PERFORMANCE STATUS: 0 - Asymptomatic  Filed Vitals:   04/27/14 0832  BP: 123/69  Pulse: 85  Temp: 98 F (36.7 C)  Resp: 18   Filed Weights   04/27/14 0832  Weight: 272 lb 14.4 oz (123.787 kg)    GENERAL:alert, no distress and comfortable SKIN: skin color, texture, turgor are normal, no rashes or significant lesions EYES: normal, Conjunctiva are pink and non-injected, sclera clear OROPHARYNX:no exudate, no erythema and lips, buccal mucosa, and tongue normal  NECK: supple, thyroid normal size, non-tender, without nodularity LYMPH:  no palpable lymphadenopathy in the cervical, axillary or inguinal LUNGS: clear to auscultation and percussion with normal breathing effort HEART: regular rate & rhythm and no murmurs and no lower extremity edema ABDOMEN:abdomen soft, non-tender and normal bowel sounds Musculoskeletal:no cyanosis of digits and no clubbing  NEURO: alert & oriented x 3 with fluent speech, no focal motor/sensory deficits  LABORATORY DATA:  I have reviewed the data as listed    Component Value Date/Time   NA 140 04/27/2014 0809   NA 137 01/04/2014 0907   K 4.0 04/27/2014 0809   K 4.1 01/04/2014 0907   CL 100 01/04/2014 0907   CO2 24 04/27/2014 0809   CO2 26 01/04/2014 0907   GLUCOSE 183* 04/27/2014 0809   GLUCOSE 112* 01/04/2014 0907   BUN 16.7 04/27/2014 0809   BUN 15 01/04/2014 0907   CREATININE 1.1 04/27/2014 0809   CREATININE 0.92 01/04/2014 0907   CREATININE 0.98 12/09/2013 0716   CALCIUM 9.3 04/27/2014 0809   CALCIUM 9.7 01/04/2014  0907   PROT 6.2* 04/27/2014 0809   PROT 7.0 01/04/2014 0907   ALBUMIN 3.5 04/27/2014 0809   ALBUMIN 3.6 01/04/2014 0907   AST 23 04/27/2014 0809   AST 46* 01/04/2014 0907   ALT 32 04/27/2014 0809   ALT 70* 01/04/2014 0907   ALKPHOS 73 04/27/2014 0809   ALKPHOS 51 01/04/2014 0907   BILITOT 0.58 04/27/2014 0809   BILITOT 0.5 01/04/2014 0907   GFRNONAA >90 01/04/2014 0907   GFRAA >90 01/04/2014 0907    No results found for: SPEP, UPEP  Lab Results  Component Value Date   WBC 5.4 04/27/2014   NEUTROABS 4.1 04/27/2014   HGB 12.8* 04/27/2014   HCT 37.5* 04/27/2014   MCV 103.0* 04/27/2014   PLT 218 04/27/2014      Chemistry      Component Value Date/Time   NA 140 04/27/2014 0809   NA 137 01/04/2014 0907   K 4.0 04/27/2014 0809   K 4.1 01/04/2014 0907   CL 100 01/04/2014 0907   CO2 24 04/27/2014 0809   CO2  26 01/04/2014 0907   BUN 16.7 04/27/2014 0809   BUN 15 01/04/2014 0907   CREATININE 1.1 04/27/2014 0809   CREATININE 0.92 01/04/2014 0907   CREATININE 0.98 12/09/2013 0716      Component Value Date/Time   CALCIUM 9.3 04/27/2014 0809   CALCIUM 9.7 01/04/2014 0907   ALKPHOS 73 04/27/2014 0809   ALKPHOS 51 01/04/2014 0907   AST 23 04/27/2014 0809   AST 46* 01/04/2014 0907   ALT 32 04/27/2014 0809   ALT 70* 01/04/2014 0907   BILITOT 0.58 04/27/2014 0809   BILITOT 0.5 01/04/2014 0907      ASSESSMENT & PLAN:  Diffuse large B cell lymphoma He is experiencing expected side effects from treatment. He had 50% dose adjustment of vincristine due to early signs of peripheral neuropathy after just 1 cycle of treatment. He is also not tolerating high-dose prednisone and a plan to reduce it to 60 mg instead of 100 mg. Recently, he had mucositis which he did not attribute to chemotherapy. It is not bothering him. We will proceed with treatment without delay. Repeat PET/CT scan recently showed near complete response to treatment. After this last cycle of treatment, I will  wait 6 weeks before repeat PET CT scan. If the next scan showed no evidence of disease, we will get his port removed.   Anemia in neoplastic disease This is likely due to recent treatment. The patient denies recent history of bleeding such as epistaxis, hematuria or hematochezia. He is asymptomatic from the anemia. I will observe for now.  He does not require transfusion now. I will continue the chemotherapy at current dose    Mucositis due to chemotherapy This is only mild grade 1. I recommend conservative management with baking soda mix.   Chronic alcoholism The patient is recommended to quit alcohol intake because this will interact with his chemotherapy. The patient is still consuming some alcohol    Orders Placed This Encounter  Procedures  . NM PET Image Restag (PS) Skull Base To Thigh    Standing Status: Future     Number of Occurrences:      Standing Expiration Date: 06/27/2015    Order Specific Question:  Reason for Exam (SYMPTOM  OR DIAGNOSIS REQUIRED)    Answer:  staging assess response to Rx    Order Specific Question:  Preferred imaging location?    Answer:  [facility]   All questions were answered. The patient knows to call the clinic with any problems, questions or concerns. No barriers to learning was detected. I spent 25 minutes counseling the patient face to face. The total time spent in the appointment was 30 minutes and more than 50% was on counseling and review of test results     [facility], [place], MD 04/27/2014 10:06 AM

## 2014-04-27 NOTE — Assessment & Plan Note (Signed)
This is only mild grade 1. I recommend conservative management with baking soda mix.

## 2014-04-27 NOTE — Assessment & Plan Note (Signed)
The patient is recommended to quit alcohol intake because this will interact with his chemotherapy. The patient is still consuming some alcohol

## 2014-04-27 NOTE — Patient Instructions (Signed)

## 2014-04-27 NOTE — Telephone Encounter (Signed)
gave and printed appt sched and avs for pt for March and May

## 2014-04-27 NOTE — Assessment & Plan Note (Signed)
He is experiencing expected side effects from treatment. He had 50% dose adjustment of vincristine due to early signs of peripheral neuropathy after just 1 cycle of treatment. He is also not tolerating high-dose prednisone and a plan to reduce it to 60 mg instead of 100 mg. Recently, he had mucositis which he did not attribute to chemotherapy. It is not bothering him. We will proceed with treatment without delay. Repeat PET/CT scan recently showed near complete response to treatment. After this last cycle of treatment, I will wait 6 weeks before repeat PET CT scan. If the next scan showed no evidence of disease, we will get his port removed.

## 2014-04-27 NOTE — Assessment & Plan Note (Signed)
This is likely due to recent treatment. The patient denies recent history of bleeding such as epistaxis, hematuria or hematochezia. He is asymptomatic from the anemia. I will observe for now.  He does not require transfusion now. I will continue the chemotherapy at current dose

## 2014-04-27 NOTE — Patient Instructions (Signed)
[facility] Discharge Instructions for Patients Receiving Chemotherapy  Today you received the following chemotherapy agents Adria, Vincristine, Cytoxan, Rituxan, Prednisone  To help prevent nausea and vomiting after your treatment, we encourage you to take your nausea medication as directed/prescribed.   If you develop nausea and vomiting that is not controlled by your nausea medication, call the clinic.   BELOW ARE SYMPTOMS THAT SHOULD BE REPORTED IMMEDIATELY:  *FEVER GREATER THAN 100.5 F  *CHILLS WITH OR WITHOUT FEVER  NAUSEA AND VOMITING THAT IS NOT CONTROLLED WITH YOUR NAUSEA MEDICATION  *UNUSUAL SHORTNESS OF BREATH  *UNUSUAL BRUISING OR BLEEDING  TENDERNESS IN MOUTH AND THROAT WITH OR WITHOUT PRESENCE OF ULCERS  *URINARY PROBLEMS  *BOWEL PROBLEMS  UNUSUAL RASH Items with * indicate a potential emergency and should be followed up as soon as possible.  Feel free to call the clinic you have any questions or concerns. The clinic phone number is [phone].  Please show the [place] at check-in to the Emergency Department and triage nurse.

## 2014-04-28 ENCOUNTER — Ambulatory Visit (HOSPITAL_BASED_OUTPATIENT_CLINIC_OR_DEPARTMENT_OTHER): Payer: BLUE CROSS/BLUE SHIELD

## 2014-04-28 DIAGNOSIS — Z5189 Encounter for other specified aftercare: Secondary | ICD-10-CM

## 2014-04-28 DIAGNOSIS — C8330 Diffuse large B-cell lymphoma, unspecified site: Secondary | ICD-10-CM

## 2014-04-28 MED ORDER — PEGFILGRASTIM INJECTION 6 MG/0.6ML ~~LOC~~
6.0000 mg | PREFILLED_SYRINGE | Freq: Once | SUBCUTANEOUS | Status: AC
  Administered 2014-04-28: 6 mg via SUBCUTANEOUS
  Filled 2014-04-28: qty 0.6

## 2014-05-04 ENCOUNTER — Other Ambulatory Visit: Payer: Self-pay | Admitting: Hematology and Oncology

## 2014-06-04 ENCOUNTER — Other Ambulatory Visit: Payer: Self-pay | Admitting: Hematology and Oncology

## 2014-06-04 DIAGNOSIS — C8330 Diffuse large B-cell lymphoma, unspecified site: Secondary | ICD-10-CM

## 2014-06-07 ENCOUNTER — Ambulatory Visit (HOSPITAL_COMMUNITY)
Admission: RE | Admit: 2014-06-07 | Discharge: 2014-06-07 | Disposition: A | Discharge status: Home / Self Care | Payer: BLUE CROSS/BLUE SHIELD | Source: Ambulatory Visit | Attending: Hematology and Oncology | Admitting: Hematology and Oncology

## 2014-06-07 ENCOUNTER — Ambulatory Visit (HOSPITAL_BASED_OUTPATIENT_CLINIC_OR_DEPARTMENT_OTHER): Payer: BLUE CROSS/BLUE SHIELD

## 2014-06-07 ENCOUNTER — Other Ambulatory Visit (HOSPITAL_BASED_OUTPATIENT_CLINIC_OR_DEPARTMENT_OTHER): Payer: BLUE CROSS/BLUE SHIELD

## 2014-06-07 VITALS — BP 129/77 | HR 79 | Temp 98.6°F

## 2014-06-07 DIAGNOSIS — C8330 Diffuse large B-cell lymphoma, unspecified site: Secondary | ICD-10-CM | POA: Diagnosis not present

## 2014-06-07 DIAGNOSIS — Z95828 Presence of other vascular implants and grafts: Secondary | ICD-10-CM

## 2014-06-07 DIAGNOSIS — Z08 Encounter for follow-up examination after completed treatment for malignant neoplasm: Secondary | ICD-10-CM | POA: Diagnosis not present

## 2014-06-07 DIAGNOSIS — Z9221 Personal history of antineoplastic chemotherapy: Secondary | ICD-10-CM | POA: Insufficient documentation

## 2014-06-07 LAB — COMPREHENSIVE METABOLIC PANEL (CC13)
ALBUMIN: 3.6 g/dL (ref 3.5–5.0)
ALT: 45 U/L (ref 0–55)
AST: 31 U/L (ref 5–34)
Alkaline Phosphatase: 68 U/L (ref 40–150)
Anion Gap: 11 mEq/L (ref 3–11)
BUN: 14.6 mg/dL (ref 7.0–26.0)
CHLORIDE: 105 meq/L (ref 98–109)
CO2: 25 meq/L (ref 22–29)
CREATININE: 0.9 mg/dL (ref 0.7–1.3)
Calcium: 9.4 mg/dL (ref 8.4–10.4)
GLUCOSE: 111 mg/dL (ref 70–140)
POTASSIUM: 4.3 meq/L (ref 3.5–5.1)
Sodium: 140 mEq/L (ref 136–145)
TOTAL PROTEIN: 6.3 g/dL — AB (ref 6.4–8.3)
Total Bilirubin: 0.58 mg/dL (ref 0.20–1.20)

## 2014-06-07 LAB — CBC WITH DIFFERENTIAL/PLATELET
BASO%: 1 % (ref 0.0–2.0)
BASOS ABS: 0.1 10*3/uL (ref 0.0–0.1)
EOS ABS: 0.2 10*3/uL (ref 0.0–0.5)
EOS%: 4.8 % (ref 0.0–7.0)
HCT: 39.2 % (ref 38.4–49.9)
HGB: 13.3 g/dL (ref 13.0–17.1)
LYMPH#: 0.7 10*3/uL — AB (ref 0.9–3.3)
LYMPH%: 13.7 % — AB (ref 14.0–49.0)
MCH: 34.3 pg — ABNORMAL HIGH (ref 27.2–33.4)
MCHC: 34 g/dL (ref 32.0–36.0)
MCV: 101 fL — ABNORMAL HIGH (ref 79.3–98.0)
MONO#: 0.8 10*3/uL (ref 0.1–0.9)
MONO%: 16.1 % — ABNORMAL HIGH (ref 0.0–14.0)
NEUT%: 64.4 % (ref 39.0–75.0)
NEUTROS ABS: 3.3 10*3/uL (ref 1.5–6.5)
Platelets: 197 10*3/uL (ref 140–400)
RBC: 3.88 10*6/uL — AB (ref 4.20–5.82)
RDW: 13.1 % (ref 11.0–14.6)
WBC: 5.2 10*3/uL (ref 4.0–10.3)

## 2014-06-07 LAB — GLUCOSE, CAPILLARY: GLUCOSE-CAPILLARY: 113 mg/dL — AB (ref 70–99)

## 2014-06-07 LAB — LACTATE DEHYDROGENASE (CC13): LDH: 208 U/L (ref 125–245)

## 2014-06-07 MED ORDER — HEPARIN SOD (PORK) LOCK FLUSH 100 UNIT/ML IV SOLN
500.0000 [IU] | Freq: Once | INTRAVENOUS | Status: AC
  Administered 2014-06-07: 500 [IU] via INTRAVENOUS
  Filled 2014-06-07: qty 5

## 2014-06-07 MED ORDER — SODIUM CHLORIDE 0.9 % IJ SOLN
10.0000 mL | INTRAMUSCULAR | Status: DC | PRN
  Administered 2014-06-07: 10 mL via INTRAVENOUS
  Filled 2014-06-07: qty 10

## 2014-06-07 MED ORDER — FLUDEOXYGLUCOSE F - 18 (FDG) INJECTION
13.6400 | Freq: Once | INTRAVENOUS | Status: AC | PRN
  Administered 2014-06-07: 13.64 via INTRAVENOUS

## 2014-06-07 NOTE — Patient Instructions (Signed)

## 2014-06-07 NOTE — Progress Notes (Signed)
Patient in for Port-A-Cath access and labs today. Patient has a PET scan appointment today. Port accessed with PH needle and covered with Tegaderm dressing. Blood return noted with flush and labs obtained. Encouraged Patient to remind [facility] Medicine department to remove the Port-A-Cath needle after PET scan is complete or to return to the [facility] to have needle removed, and Patient verbalized understanding.

## 2014-06-08 ENCOUNTER — Telehealth: Payer: Self-pay | Admitting: Hematology and Oncology

## 2014-06-08 ENCOUNTER — Encounter: Payer: Self-pay | Admitting: *Deleted

## 2014-06-08 ENCOUNTER — Ambulatory Visit (HOSPITAL_BASED_OUTPATIENT_CLINIC_OR_DEPARTMENT_OTHER): Payer: BLUE CROSS/BLUE SHIELD | Admitting: Hematology and Oncology

## 2014-06-08 ENCOUNTER — Other Ambulatory Visit: Payer: Self-pay | Admitting: Hematology and Oncology

## 2014-06-08 ENCOUNTER — Encounter: Payer: Self-pay | Admitting: Hematology and Oncology

## 2014-06-08 VITALS — BP 102/71 | HR 73 | Temp 98.4°F | Resp 20 | Ht 72.0 in | Wt 276.1 lb

## 2014-06-08 DIAGNOSIS — Z006 Encounter for examination for normal comparison and control in clinical research program: Secondary | ICD-10-CM | POA: Diagnosis not present

## 2014-06-08 DIAGNOSIS — C8330 Diffuse large B-cell lymphoma, unspecified site: Secondary | ICD-10-CM | POA: Diagnosis not present

## 2014-06-08 DIAGNOSIS — Z9221 Personal history of antineoplastic chemotherapy: Secondary | ICD-10-CM

## 2014-06-08 NOTE — Assessment & Plan Note (Signed)
Clinically, he had no signs of disease. PET CT scan showed complete remission. The patient will continue to be enrolled in clinical trial. We discussed about whether to bring him back for port flushes and keep the port patent for the next 2 years. He will go home and think about it. I will see him back in 3 months with history, physical examination and blood work. I plan to defer imaging study to the future only as needed if there are clinical signs suspicious for disease recurrence. I do not recommend routine surveillance imaging for him.

## 2014-06-08 NOTE — Progress Notes (Signed)
[employer] OFFICE PROGRESS NOTE  Patient Care Team: Mikey Kirschner, MD as PCP - General (Family Medicine)  SUMMARY OF ONCOLOGIC HISTORY: Oncology History   Diffuse large B cell lymphoma   Staging form: Lymphoid Neoplasms, AJCC 6th Edition     Clinical stage from 12/30/2013: Stage III - Signed by Heath Lark, MD on 01/08/2014     Pathologic: No stage assigned - Unsigned       Diffuse large B cell lymphoma   12/14/2013 Imaging CT scan of the abdomen and pelvis showed bulky left iliac chain and left inguinal adenopathy, highly worrisome for lymphoma.   12/22/2013 Surgery He underwent excisional lymph node biopsy of the left inguinal region that confirm lymphoma diagnosis. No   12/22/2013 Pathology Results Accession: [id number] pathology showed a high-grade follicular lymphoma with possibly foci of diffuse large B-cell lymphoma   01/04/2014 Bone Marrow Biopsy BM biopsy was negative with normal cytogenetics   01/05/2014 Imaging PET CT scan showed lymph nodes involvement of both sides of her diaphragm   01/06/2014 Surgery He has port placed   01/07/2014 Imaging ECHO showed preserved EF of 73%, mildly dilated atrium   01/12/2014 - 04/27/2014 Chemotherapy He is received 6 cycles of R CHOP chemotherapy   02/02/2014 Adverse Reaction Vincristine dose was reduced by 50% due to early signs of peripheral neuropathy   03/11/2014 Imaging Repeat PET scan showed near complete response to treatment   06/07/2014 Imaging PET scan showed complete response to treatment    INTERVAL HISTORY: Please see below for problem oriented charting. He feels well. He has no residual side effects from last treatment. He denies neuropathy.  REVIEW OF SYSTEMS:   Constitutional: Denies fevers, chills or abnormal weight loss Eyes: Denies blurriness of vision Ears, nose, mouth, throat, and face: Denies mucositis or sore throat Respiratory: Denies cough, dyspnea or wheezes Cardiovascular: Denies palpitation, chest  discomfort or lower extremity swelling Gastrointestinal:  Denies nausea, heartburn or change in bowel habits Skin: Denies abnormal skin rashes Lymphatics: Denies new lymphadenopathy or easy bruising Neurological:Denies numbness, tingling or new weaknesses Behavioral/Psych: Mood is stable, no new changes  All other systems were reviewed with the patient and are negative.  I have reviewed the past medical history, past surgical history, social history and family history with the patient and they are unchanged from previous note.  ALLERGIES:  has No Known Allergies.  MEDICATIONS:  Current Outpatient Prescriptions  Medication Sig Dispense Refill  . Atorvastatin Calcium (INVESTIGATIONAL ATORVASTATIN/PLACEBO) 40 MG tablet [facility] [id number] Take 1 tablet by mouth daily. Take 2 doses (these doses must be 12 hours apart) prior to first chemotherapy treatment. Then take 1 tablet daily by mouth with or without food. 180 tablet 0  . enalapril (VASOTEC) 20 MG tablet Take 1 tablet (20 mg total) by mouth at bedtime. 90 tablet 3   No current facility-administered medications for this visit.    PHYSICAL EXAMINATION: ECOG PERFORMANCE STATUS: 0 - Asymptomatic  Filed Vitals:   06/08/14 0835  BP: 102/71  Pulse: 73  Temp: 98.4 F (36.9 C)  Resp: 20   Filed Weights   06/08/14 0835  Weight: 276 lb 1.6 oz (125.238 kg)    GENERAL:alert, no distress and comfortable SKIN: skin color, texture, turgor are normal, no rashes or significant lesions EYES: normal, Conjunctiva are pink and non-injected, sclera clear OROPHARYNX:no exudate, no erythema and lips, buccal mucosa, and tongue normal  NECK: supple, thyroid normal size, non-tender, without nodularity LYMPH:  no palpable lymphadenopathy  in the cervical, axillary or inguinal LUNGS: clear to auscultation and percussion with normal breathing effort HEART: regular rate & rhythm and no murmurs and no lower extremity edema ABDOMEN:abdomen soft,  non-tender and normal bowel sounds Musculoskeletal:no cyanosis of digits and no clubbing  NEURO: alert & oriented x 3 with fluent speech, no focal motor/sensory deficits  LABORATORY DATA:  I have reviewed the data as listed    Component Value Date/Time   NA 140 06/07/2014 0810   NA 137 01/04/2014 0907   K 4.3 06/07/2014 0810   K 4.1 01/04/2014 0907   CL 100 01/04/2014 0907   CO2 25 06/07/2014 0810   CO2 26 01/04/2014 0907   GLUCOSE 111 06/07/2014 0810   GLUCOSE 112* 01/04/2014 0907   BUN 14.6 06/07/2014 0810   BUN 15 01/04/2014 0907   CREATININE 0.9 06/07/2014 0810   CREATININE 0.92 01/04/2014 0907   CREATININE 0.98 12/09/2013 0716   CALCIUM 9.4 06/07/2014 0810   CALCIUM 9.7 01/04/2014 0907   PROT 6.3* 06/07/2014 0810   PROT 7.0 01/04/2014 0907   ALBUMIN 3.6 06/07/2014 0810   ALBUMIN 3.6 01/04/2014 0907   AST 31 06/07/2014 0810   AST 46* 01/04/2014 0907   ALT 45 06/07/2014 0810   ALT 70* 01/04/2014 0907   ALKPHOS 68 06/07/2014 0810   ALKPHOS 51 01/04/2014 0907   BILITOT 0.58 06/07/2014 0810   BILITOT 0.5 01/04/2014 0907   GFRNONAA >90 01/04/2014 0907   GFRAA >90 01/04/2014 0907    No results found for: SPEP, UPEP  Lab Results  Component Value Date   WBC 5.2 06/07/2014   NEUTROABS 3.3 06/07/2014   HGB 13.3 06/07/2014   HCT 39.2 06/07/2014   MCV 101.0* 06/07/2014   PLT 197 06/07/2014      Chemistry      Component Value Date/Time   NA 140 06/07/2014 0810   NA 137 01/04/2014 0907   K 4.3 06/07/2014 0810   K 4.1 01/04/2014 0907   CL 100 01/04/2014 0907   CO2 25 06/07/2014 0810   CO2 26 01/04/2014 0907   BUN 14.6 06/07/2014 0810   BUN 15 01/04/2014 0907   CREATININE 0.9 06/07/2014 0810   CREATININE 0.92 01/04/2014 0907   CREATININE 0.98 12/09/2013 0716      Component Value Date/Time   CALCIUM 9.4 06/07/2014 0810   CALCIUM 9.7 01/04/2014 0907   ALKPHOS 68 06/07/2014 0810   ALKPHOS 51 01/04/2014 0907   AST 31 06/07/2014 0810   AST 46* 01/04/2014 0907    ALT 45 06/07/2014 0810   ALT 70* 01/04/2014 0907   BILITOT 0.58 06/07/2014 0810   BILITOT 0.5 01/04/2014 0907       RADIOGRAPHIC STUDIES: I have personally reviewed the radiological images as listed and agreed with the findings in the report. Nm Pet Image Restag (ps) Skull Base To Thigh  06/07/2014   CLINICAL DATA:  Subsequent treatment strategy for lymphoma.  EXAM: NUCLEAR MEDICINE PET SKULL BASE TO THIGH  TECHNIQUE: 13.64 mCi F-18 FDG was injected intravenously. Full-ring PET imaging was performed from the skull base to thigh after the radiotracer. CT data was obtained and used for attenuation correction and anatomic localization.  FASTING BLOOD GLUCOSE:  Value: 113 mg/dl  COMPARISON:  03/11/2014  FINDINGS: NECK  No hypermetabolic lymph nodes in the neck.  CHEST  No hypermetabolic mediastinal or hilar nodes. No suspicious pulmonary nodules on the CT scan. Stable chronic scarring changes and calcified granulomas.  ABDOMEN/PELVIS  No abnormal hypermetabolic activity  within the liver, pancreas, adrenal glands, or spleen. No hypermetabolic lymph nodes in the abdomen or pelvis.  Stable postoperative changes in the left inguinal area. No findings for recurrent hypermetabolic lymphadenopathy.  SKELETON  Mild diffuse osseous uptake decreased since the prior examination and likely due to rebound from chemotherapy.  IMPRESSION: No PET-CT findings for residual or recurrent lymphoma.   Electronically Signed   By: Marijo Sanes M.D.   On: 06/07/2014 10:17     ASSESSMENT & PLAN:  Diffuse large B cell lymphoma Clinically, he had no signs of disease. PET CT scan showed complete remission. The patient will continue to be enrolled in clinical trial. We discussed about whether to bring him back for port flushes and keep the port patent for the next 2 years. He will go home and think about it. I will see him back in 3 months with history, physical examination and blood work. I plan to defer imaging study to  the future only as needed if there are clinical signs suspicious for disease recurrence. I do not recommend routine surveillance imaging for him.    Orders Placed This Encounter  Procedures  . CBC with Differential/Platelet    Standing Status: Standing     Number of Occurrences: 22     Standing Expiration Date: 06/08/2015  . Comprehensive metabolic panel    Standing Status: Standing     Number of Occurrences: 22     Standing Expiration Date: 06/08/2015  . Lactate dehydrogenase    Standing Status: Standing     Number of Occurrences: 22     Standing Expiration Date: 06/08/2015   All questions were answered. The patient knows to call the clinic with any problems, questions or concerns. No barriers to learning was detected. I spent 15 minutes counseling the patient face to face. The total time spent in the appointment was 20 minutes and more than 50% was on counseling and review of test results     [facility], [place], MD 06/08/2014 9:59 AM

## 2014-06-08 NOTE — Telephone Encounter (Signed)
per pof to sch pt appt-gave tp copy of sch °

## 2014-06-08 NOTE — Progress Notes (Signed)
06/08/2014 0830 [id number] PREVENT study Met with the patient and assessed for toxicity.  The patient does not have any complaints and denies having any side effects related to the study drug.  Discussed 6 month visit requirements with patient.  He is okay with coming back on 06/30/14 for cardiac MRI, nurse visit, and research labs.  Dr. Alvy Bimler aware of above.

## 2014-06-10 ENCOUNTER — Encounter: Payer: Self-pay | Admitting: Hematology and Oncology

## 2014-06-10 ENCOUNTER — Other Ambulatory Visit: Payer: Self-pay | Admitting: Hematology and Oncology

## 2014-06-10 DIAGNOSIS — C8330 Diffuse large B-cell lymphoma, unspecified site: Secondary | ICD-10-CM

## 2014-06-15 ENCOUNTER — Other Ambulatory Visit: Payer: Self-pay | Admitting: General Surgery

## 2014-06-22 ENCOUNTER — Other Ambulatory Visit: Payer: Self-pay | Admitting: Hematology and Oncology

## 2014-06-22 DIAGNOSIS — M79609 Pain in unspecified limb: Secondary | ICD-10-CM | POA: Insufficient documentation

## 2014-06-28 ENCOUNTER — Other Ambulatory Visit: Payer: Self-pay | Admitting: *Deleted

## 2014-06-29 NOTE — Patient Instructions (Signed)
Jared Bentley  06/29/2014   Your procedure is scheduled on:    07/08/2014    Report to Surgery By [facility] Main  Entrance and follow signs to               [place] at       1000 AM.  Call this number if you have problems the morning of surgery [phone]   Remember: ONLY 1 PERSON MAY GO WITH YOU TO SHORT STAY TO GET  READY MORNING OF [place].  Do not eat food or drink liquids :After Midnight.     Take these medicines the morning of surgery with A SIP OF WATER: none                                You may not have any metal on your body including hair pins and              piercings  Do not wear jewelry,  lotions, powders or perfumes, deodorant                        Men may shave face and neck.   Do not bring valuables to the hospital. [place].  Contacts, dentures or bridgework may not be worn into surgery.      Patients discharged the day of surgery will not be allowed to drive home.  Name and phone number of your driver:  Special Instructions: coughing and deep breathing exercises               Please read over the following fact sheets you were given: _____________________________________________________________________             [facility] - Preparing for Surgery Before surgery, you can play an important role.  Because skin is not sterile, your skin needs to be as free of germs as possible.  You can reduce the number of germs on your skin by washing with CHG (chlorahexidine gluconate) soap before surgery.  CHG is an antiseptic cleaner which kills germs and bonds with the skin to continue killing germs even after washing. Please DO NOT use if you have an allergy to CHG or antibacterial soaps.  If your skin becomes reddened/irritated stop using the CHG and inform your nurse when you arrive at Short Stay. Do not shave (including legs and underarms) for at least 48 hours prior to the first CHG  shower.  You may shave your face/neck. Please follow these instructions carefully:  1.  Shower with CHG Soap the night before surgery and the  morning of Surgery.  2.  If you choose to wash your hair, wash your hair first as usual with your  normal  shampoo.  3.  After you shampoo, rinse your hair and body thoroughly to remove the  shampoo.                           4.  Use CHG as you would any other liquid soap.  You can apply chg directly  to the skin and wash  Gently with a scrungie or clean washcloth.  5.  Apply the CHG Soap to your body ONLY FROM THE NECK DOWN.   Do not use on face/ open                           Wound or open sores. Avoid contact with eyes, ears mouth and genitals (private parts).                       Wash face,  Genitals (private parts) with your normal soap.             6.  Wash thoroughly, paying special attention to the area where your surgery  will be performed.  7.  Thoroughly rinse your body with warm water from the neck down.  8.  DO NOT shower/wash with your normal soap after using and rinsing off  the CHG Soap.                9.  Pat yourself dry with a clean towel.            10.  Wear clean pajamas.            11.  Place clean sheets on your bed the night of your first shower and do not  sleep with pets. Day of Surgery : Do not apply any lotions/deodorants the morning of surgery.  Please wear clean clothes to the hospital/surgery center.  FAILURE TO FOLLOW THESE INSTRUCTIONS MAY RESULT IN THE CANCELLATION OF YOUR SURGERY PATIENT SIGNATURE_________________________________  NURSE SIGNATURE__________________________________  ________________________________________________________________________

## 2014-06-30 ENCOUNTER — Ambulatory Visit (HOSPITAL_BASED_OUTPATIENT_CLINIC_OR_DEPARTMENT_OTHER): Payer: BLUE CROSS/BLUE SHIELD

## 2014-06-30 ENCOUNTER — Other Ambulatory Visit: Payer: BLUE CROSS/BLUE SHIELD

## 2014-06-30 ENCOUNTER — Inpatient Hospital Stay (HOSPITAL_COMMUNITY)
Admission: RE | Admit: 2014-06-30 | Discharge: 2014-06-30 | Disposition: A | Discharge status: Home / Self Care | Payer: BLUE CROSS/BLUE SHIELD | Source: Ambulatory Visit

## 2014-06-30 ENCOUNTER — Encounter: Payer: BLUE CROSS/BLUE SHIELD | Admitting: *Deleted

## 2014-06-30 ENCOUNTER — Ambulatory Visit (HOSPITAL_COMMUNITY)
Admission: RE | Admit: 2014-06-30 | Discharge: 2014-06-30 | Disposition: A | Discharge status: Home / Self Care | Payer: BLUE CROSS/BLUE SHIELD | Source: Ambulatory Visit | Attending: Hematology and Oncology | Admitting: Hematology and Oncology

## 2014-06-30 ENCOUNTER — Encounter: Payer: Self-pay | Admitting: *Deleted

## 2014-06-30 ENCOUNTER — Other Ambulatory Visit (HOSPITAL_BASED_OUTPATIENT_CLINIC_OR_DEPARTMENT_OTHER): Payer: BLUE CROSS/BLUE SHIELD

## 2014-06-30 VITALS — BP 124/82 | HR 82 | Temp 97.8°F | Ht 69.0 in | Wt 277.7 lb

## 2014-06-30 DIAGNOSIS — C8330 Diffuse large B-cell lymphoma, unspecified site: Secondary | ICD-10-CM

## 2014-06-30 DIAGNOSIS — Z006 Encounter for examination for normal comparison and control in clinical research program: Secondary | ICD-10-CM

## 2014-06-30 DIAGNOSIS — Z95828 Presence of other vascular implants and grafts: Secondary | ICD-10-CM

## 2014-06-30 LAB — COMPREHENSIVE METABOLIC PANEL (CC13)
ALT: 42 U/L (ref 0–55)
AST: 32 U/L (ref 5–34)
Albumin: 3.8 g/dL (ref 3.5–5.0)
Alkaline Phosphatase: 73 U/L (ref 40–150)
Anion Gap: 10 mEq/L (ref 3–11)
BUN: 12.3 mg/dL (ref 7.0–26.0)
CALCIUM: 9.4 mg/dL (ref 8.4–10.4)
CHLORIDE: 105 meq/L (ref 98–109)
CO2: 25 mEq/L (ref 22–29)
Creatinine: 0.9 mg/dL (ref 0.7–1.3)
Glucose: 101 mg/dl (ref 70–140)
Potassium: 4.4 mEq/L (ref 3.5–5.1)
SODIUM: 140 meq/L (ref 136–145)
Total Bilirubin: 0.76 mg/dL (ref 0.20–1.20)
Total Protein: 6.5 g/dL (ref 6.4–8.3)

## 2014-06-30 LAB — CBC WITH DIFFERENTIAL/PLATELET
BASO%: 0.8 % (ref 0.0–2.0)
BASOS ABS: 0 10*3/uL (ref 0.0–0.1)
EOS%: 2.1 % (ref 0.0–7.0)
Eosinophils Absolute: 0.1 10*3/uL (ref 0.0–0.5)
HEMATOCRIT: 42.3 % (ref 38.4–49.9)
HEMOGLOBIN: 14.4 g/dL (ref 13.0–17.1)
LYMPH%: 14.1 % (ref 14.0–49.0)
MCH: 33.7 pg — ABNORMAL HIGH (ref 27.2–33.4)
MCHC: 34.1 g/dL (ref 32.0–36.0)
MCV: 99 fL — ABNORMAL HIGH (ref 79.3–98.0)
MONO#: 0.7 10*3/uL (ref 0.1–0.9)
MONO%: 11.4 % (ref 0.0–14.0)
NEUT#: 4.4 10*3/uL (ref 1.5–6.5)
NEUT%: 71.6 % (ref 39.0–75.0)
PLATELETS: 197 10*3/uL (ref 140–400)
RBC: 4.27 10*6/uL (ref 4.20–5.82)
RDW: 12.5 % (ref 11.0–14.6)
WBC: 6.1 10*3/uL (ref 4.0–10.3)
lymph#: 0.9 10*3/uL (ref 0.9–3.3)

## 2014-06-30 LAB — RESEARCH LABS

## 2014-06-30 LAB — LACTATE DEHYDROGENASE (CC13): LDH: 182 U/L (ref 125–245)

## 2014-06-30 MED ORDER — HEPARIN SOD (PORK) LOCK FLUSH 100 UNIT/ML IV SOLN
500.0000 [IU] | Freq: Once | INTRAVENOUS | Status: AC
  Administered 2014-06-30: 500 [IU] via INTRAVENOUS
  Filled 2014-06-30: qty 5

## 2014-06-30 MED ORDER — SODIUM CHLORIDE 0.9 % IJ SOLN
10.0000 mL | INTRAMUSCULAR | Status: DC | PRN
  Administered 2014-06-30: 10 mL via INTRAVENOUS
  Filled 2014-06-30: qty 10

## 2014-06-30 MED ORDER — INV-ATORVASTATIN/PLACEBO 40 MG TABS WAKE FOREST WF 98213: 1.0000 | ORAL_TABLET | Freq: Every day | ORAL | Status: DC

## 2014-06-30 NOTE — Progress Notes (Signed)
Spoke with patient regarding multiple appointments on 06/30/2014.  Will meet with patient prior to MRI on 06/30/14 and given him soap along with soap instructions.  Patient to call me when he arrives for MRI.  Will review meds and history by phone.  Patient does not need to come for preop appointment on 06/30/14.

## 2014-06-30 NOTE — Progress Notes (Signed)
06/30/2014 0930 [id number] PREVENT 6 month visit Patient in to clinic today for 6 month evaluation on PREVENT study. He had his 6 month MRI done earlier today, prior to this meeting. He completed his 6 month neurocog./questionnaires. This RN performed the concomitant medication assessment,nurse exam, including the TAS and waist circumference.He denies any toxicities related to study drug.He stated he has not missed taking any study drug. He returned his study calendars for Feb-April 2016 and the study medication bottle.  The medication bottle was returned to pharmacy.  This RN confirmed there were 9 pills left (verified with Montel Clock, Pharm. D) The patient was dispensed a new pill bottle. Directions for how to take the drug were reviewed with patient. He was given new medication calendars for months June-Nov 2016. The patient will have research labs drawn after this meeting and will be escorted by Marthann Schiller, Research Assistant to the lab. This RN thanked the patient for his time and continued participation in the study. The patient denied having any questions after the RN visit and was then sent for labs.Marcellus Scott, RN Clinical Research Nurse [phone]

## 2014-06-30 NOTE — Progress Notes (Signed)
EKG- 12/22/13 EPIC  01/07/2014 ECHO- EPIC  1v CXR 01/06/14 EPIC

## 2014-06-30 NOTE — Patient Instructions (Signed)

## 2014-06-30 NOTE — Progress Notes (Signed)
Patient did not call this am when he arrived for MRI.  Walked to MRI and gave Radiology Tech paper bag with hibiclens and preop instructions.  Barnett Applebaum, oncology research called and wanted to know what labs we needed.  I told her I had spoken with Oncology Lab on 06/30/14 and they stated theywould be getting research labs along with CBC/DIFF and CMP. I told her as long as those labs were done PST would not need anymore labs.  She voiced understanding.  Patient does have hibiclens and preop instructions with my phone number of [phone].  He knows to call if any further questions.

## 2014-07-01 ENCOUNTER — Other Ambulatory Visit (HOSPITAL_COMMUNITY)
Admission: RE | Admit: 2014-07-01 | Discharge: 2014-07-01 | Disposition: A | Discharge status: Home / Self Care | Payer: BLUE CROSS/BLUE SHIELD | Source: Ambulatory Visit | Attending: Hematology and Oncology | Admitting: Hematology and Oncology

## 2014-07-01 ENCOUNTER — Encounter: Payer: Self-pay | Admitting: *Deleted

## 2014-07-01 ENCOUNTER — Other Ambulatory Visit: Payer: BLUE CROSS/BLUE SHIELD

## 2014-07-01 ENCOUNTER — Other Ambulatory Visit: Payer: Self-pay

## 2014-07-01 ENCOUNTER — Ambulatory Visit: Payer: BLUE CROSS/BLUE SHIELD

## 2014-07-01 ENCOUNTER — Other Ambulatory Visit: Payer: Self-pay | Admitting: *Deleted

## 2014-07-01 ENCOUNTER — Telehealth: Payer: Self-pay | Admitting: Hematology and Oncology

## 2014-07-01 DIAGNOSIS — C8330 Diffuse large B-cell lymphoma, unspecified site: Secondary | ICD-10-CM

## 2014-07-01 DIAGNOSIS — C8333 Diffuse large B-cell lymphoma, intra-abdominal lymph nodes: Secondary | ICD-10-CM | POA: Insufficient documentation

## 2014-07-01 LAB — TROPONIN I: Troponin I: 0.05 ng/mL — ABNORMAL HIGH (ref <0.031)

## 2014-07-01 MED ORDER — ASPIRIN EC 81 MG PO TBEC: 81.0000 mg | DELAYED_RELEASE_TABLET | Freq: Every day | ORAL | Status: DC

## 2014-07-01 NOTE — Progress Notes (Signed)
Dr Alvy Bimler made aware of critical value of 0.06 Troponin. States "Jared Bentley in research should contact the chair that such blood tests may lead to a lot of unnecessary consults. Bring patient back today to repeat troponin, EKG and consult cardiology. Needs to take 81 mg aspirin if not already on it. Asked Dr Sherren Mocha about it- you never order troponin randomly without clinical correlation"

## 2014-07-01 NOTE — Progress Notes (Signed)
07/01/2014 PREVENT study Received message from Remer Macho, Research Assistant, that a critical Troponin I lab value=0.06 collected on 06/30/14 was called to her at 0830 from [facility].  This RN notified Dr. Calton Dach nurse Cherre Huger, who was in touch with MD by phone.  Per Tammi, Dr. Alvy Bimler said  "Adonis Huguenin in research should contact the chair that such blood tests may lead to a lot of unnecessary consults. Bring patient back today to repeat troponin, EKG and consult cardiology for appointment.  Needs to take 81 mg aspirin if not already on it".   The patient was contacted several times by this RN and this RN was able to reach the patient at 1015.  The patient was informed of above orders from Dr. Alvy Bimler.  The patient will be coming to [facility] today for his lab and EKG. This RN thanked the patient for his time.  This RN Therapist, sports, Kenton Kingfisher, of above who communicated above with Dr. Danny Lawless at [facility] and with Dr. Alen Blew, on-call physician at [facility]. Marcellus Scott, RN Clinical Research  07/01/2014 1450 The patient arrived and had lab performed and EKG.  The patient denies having any cardiac symptoms, including chest pain or pressure, SOB, arm pain, etc.  Vital signs are WNL.  The EKG results were WNL  and the Troponin I level came back at 0.05 (H).  Dr. Alen Blew reviewed the above results.  Per Dr. Alen Blew " No need to see the patient if not having symptoms and feels okay.  Follow-up with cardiologist on outpatient basis.  Go to the emergency room for chest pain and pressure".  The patient was instructed of above.  The patient again denied having any cardiac symptoms and verbalized understanding of all of the above instructions.  Patient was given appointment with cardiology for next week, 07/08/14 with Dr. Dorris Carnes, but patient has a planned surgical procedure same day.   Tried to re-schedule but the next available is on 09/09/14..  This RN will notify Dr. Alvy Bimler next week when she returns to address  the cardiology appointment date. The only grade for the above condition is cardiac disorder-other- grade 1 for abnormal troponin lab as the patient is asymptomatic. Above reviewed with Clinical Research Manager, Kenton Kingfisher, who was in agreement with plan. Marcellus Scott, RN Clinical Research

## 2014-07-01 NOTE — Telephone Encounter (Signed)
lvm for pt regarding to 5.26 appt.Marland KitchenMarland KitchenMarland Kitchen

## 2014-07-06 ENCOUNTER — Encounter: Payer: Self-pay | Admitting: *Deleted

## 2014-07-06 DIAGNOSIS — C8330 Diffuse large B-cell lymphoma, unspecified site: Secondary | ICD-10-CM

## 2014-07-06 NOTE — Progress Notes (Signed)
07/06/2014 1155  PREVENT Spoke with Dr. Alvy Bimler regarding patient's cardiology appointment  and the earliest date available being 09/09/14.  The patient could not go to original appointment given for 07/08/14 because he said he is getting his port removed that date. Dr. Calton Dach nurse tried to re-schedule cardiology appointment  but the next available one is on 09/09/14. Dr. Alvy Bimler aware of above and said she would contact cardiology and speak to someone to arrange for earlier new patient appointment date. Marcellus Scott, RN Clinical Research

## 2014-07-07 MED ORDER — DEXTROSE 5 % IV SOLN
3.0000 g | INTRAVENOUS | Status: AC
  Administered 2014-07-08: 3 g via INTRAVENOUS
  Filled 2014-07-07: qty 3000

## 2014-07-08 ENCOUNTER — Ambulatory Visit (HOSPITAL_COMMUNITY): Payer: BLUE CROSS/BLUE SHIELD | Admitting: Anesthesiology

## 2014-07-08 ENCOUNTER — Ambulatory Visit (HOSPITAL_COMMUNITY)
Admission: RE | Admit: 2014-07-08 | Discharge: 2014-07-08 | Disposition: A | Discharge status: Home / Self Care | Payer: BLUE CROSS/BLUE SHIELD | Source: Ambulatory Visit | Attending: General Surgery | Admitting: General Surgery

## 2014-07-08 ENCOUNTER — Encounter (HOSPITAL_COMMUNITY): Payer: Self-pay | Admitting: *Deleted

## 2014-07-08 ENCOUNTER — Telehealth: Payer: Self-pay | Admitting: *Deleted

## 2014-07-08 ENCOUNTER — Ambulatory Visit: Payer: BLUE CROSS/BLUE SHIELD | Admitting: Internal Medicine

## 2014-07-08 ENCOUNTER — Encounter (HOSPITAL_COMMUNITY)
Admission: RE | Disposition: A | Discharge status: Home / Self Care | Payer: Self-pay | Source: Ambulatory Visit | Attending: General Surgery

## 2014-07-08 DIAGNOSIS — Z6841 Body Mass Index (BMI) 40.0 and over, adult: Secondary | ICD-10-CM | POA: Diagnosis not present

## 2014-07-08 DIAGNOSIS — F1910 Other psychoactive substance abuse, uncomplicated: Secondary | ICD-10-CM | POA: Insufficient documentation

## 2014-07-08 DIAGNOSIS — G8929 Other chronic pain: Secondary | ICD-10-CM | POA: Insufficient documentation

## 2014-07-08 DIAGNOSIS — Z9989 Dependence on other enabling machines and devices: Secondary | ICD-10-CM | POA: Insufficient documentation

## 2014-07-08 DIAGNOSIS — G4733 Obstructive sleep apnea (adult) (pediatric): Secondary | ICD-10-CM | POA: Insufficient documentation

## 2014-07-08 DIAGNOSIS — Z7982 Long term (current) use of aspirin: Secondary | ICD-10-CM | POA: Diagnosis not present

## 2014-07-08 DIAGNOSIS — Z79899 Other long term (current) drug therapy: Secondary | ICD-10-CM | POA: Insufficient documentation

## 2014-07-08 DIAGNOSIS — Z87891 Personal history of nicotine dependence: Secondary | ICD-10-CM | POA: Insufficient documentation

## 2014-07-08 DIAGNOSIS — I10 Essential (primary) hypertension: Secondary | ICD-10-CM | POA: Insufficient documentation

## 2014-07-08 DIAGNOSIS — M549 Dorsalgia, unspecified: Secondary | ICD-10-CM | POA: Insufficient documentation

## 2014-07-08 DIAGNOSIS — Z452 Encounter for adjustment and management of vascular access device: Secondary | ICD-10-CM | POA: Diagnosis not present

## 2014-07-08 DIAGNOSIS — Z8572 Personal history of non-Hodgkin lymphomas: Secondary | ICD-10-CM | POA: Diagnosis not present

## 2014-07-08 HISTORY — PX: PORT-A-CATH REMOVAL: SHX5289

## 2014-07-08 SURGERY — REMOVAL PORT-A-CATH
Anesthesia: Monitor Anesthesia Care

## 2014-07-08 MED ORDER — MIDAZOLAM HCL 2 MG/2ML IJ SOLN
INTRAMUSCULAR | Status: AC
  Filled 2014-07-08: qty 2

## 2014-07-08 MED ORDER — PROPOFOL 10 MG/ML IV BOLUS
INTRAVENOUS | Status: AC
  Filled 2014-07-08: qty 20

## 2014-07-08 MED ORDER — ONDANSETRON HCL 4 MG/2ML IJ SOLN
INTRAMUSCULAR | Status: DC | PRN
  Administered 2014-07-08: 4 mg via INTRAVENOUS

## 2014-07-08 MED ORDER — ONDANSETRON HCL 4 MG/2ML IJ SOLN
INTRAMUSCULAR | Status: AC
  Filled 2014-07-08: qty 2

## 2014-07-08 MED ORDER — MIDAZOLAM HCL 5 MG/5ML IJ SOLN
INTRAMUSCULAR | Status: DC | PRN
  Administered 2014-07-08 (×2): 2 mg via INTRAVENOUS

## 2014-07-08 MED ORDER — FENTANYL CITRATE (PF) 250 MCG/5ML IJ SOLN
INTRAMUSCULAR | Status: AC
  Filled 2014-07-08: qty 5

## 2014-07-08 MED ORDER — BUPIVACAINE-EPINEPHRINE 0.25% -1:200000 IJ SOLN
INTRAMUSCULAR | Status: DC | PRN
  Administered 2014-07-08: 10 mL

## 2014-07-08 MED ORDER — PROPOFOL INFUSION 10 MG/ML OPTIME
INTRAVENOUS | Status: DC | PRN
  Administered 2014-07-08: 150 ug/kg/min via INTRAVENOUS

## 2014-07-08 MED ORDER — OXYCODONE-ACETAMINOPHEN 5-325 MG PO TABS: 1.0000 | ORAL_TABLET | ORAL | Status: DC | PRN

## 2014-07-08 MED ORDER — LIDOCAINE HCL 1 % IJ SOLN
INTRAMUSCULAR | Status: AC
  Filled 2014-07-08: qty 20

## 2014-07-08 MED ORDER — 0.9 % SODIUM CHLORIDE (POUR BTL) OPTIME
TOPICAL | Status: DC | PRN
  Administered 2014-07-08: 1000 mL

## 2014-07-08 MED ORDER — PROMETHAZINE HCL 25 MG/ML IJ SOLN: 6.2500 mg | INTRAMUSCULAR | Status: DC | PRN

## 2014-07-08 MED ORDER — FENTANYL CITRATE (PF) 100 MCG/2ML IJ SOLN: 25.0000 ug | INTRAMUSCULAR | Status: DC | PRN

## 2014-07-08 MED ORDER — BUPIVACAINE-EPINEPHRINE (PF) 0.25% -1:200000 IJ SOLN
INTRAMUSCULAR | Status: AC
  Filled 2014-07-08: qty 30

## 2014-07-08 MED ORDER — LACTATED RINGERS IV SOLN
INTRAVENOUS | Status: DC | PRN
  Administered 2014-07-08 (×2): via INTRAVENOUS

## 2014-07-08 MED ORDER — LIDOCAINE HCL (PF) 1 % IJ SOLN
INTRAMUSCULAR | Status: DC | PRN
  Administered 2014-07-08: 10 mL

## 2014-07-08 MED ORDER — FENTANYL CITRATE (PF) 100 MCG/2ML IJ SOLN
INTRAMUSCULAR | Status: DC | PRN
  Administered 2014-07-08: 100 ug via INTRAVENOUS

## 2014-07-08 MED ORDER — DEXAMETHASONE SODIUM PHOSPHATE 10 MG/ML IJ SOLN
INTRAMUSCULAR | Status: AC
  Filled 2014-07-08: qty 1

## 2014-07-08 MED ORDER — LIDOCAINE HCL (CARDIAC) 20 MG/ML IV SOLN
INTRAVENOUS | Status: AC
  Filled 2014-07-08: qty 5

## 2014-07-08 SURGICAL SUPPLY — 41 items
ADH SKN CLS APL DERMABOND .7 (GAUZE/BANDAGES/DRESSINGS) ×1
APL SKNCLS STERI-STRIP NONHPOA (GAUZE/BANDAGES/DRESSINGS) ×1
BENZOIN TINCTURE PRP APPL 2/3 (GAUZE/BANDAGES/DRESSINGS) ×3 IMPLANT
BLADE SURG 15 STRL LF DISP TIS (BLADE) ×1 IMPLANT
BLADE SURG 15 STRL SS (BLADE) ×3
CHLORAPREP W/TINT 10.5 ML (MISCELLANEOUS) ×3 IMPLANT
CLOSURE WOUND 1/2 X4 (GAUZE/BANDAGES/DRESSINGS) ×1
DECANTER SPIKE VIAL GLASS SM (MISCELLANEOUS) ×3 IMPLANT
DERMABOND ADVANCED (GAUZE/BANDAGES/DRESSINGS) ×2
DERMABOND ADVANCED .7 DNX12 (GAUZE/BANDAGES/DRESSINGS) IMPLANT
DRAPE LAPAROTOMY TRNSV 102X78 (DRAPE) IMPLANT
ELECT COATED BLADE 2.86 ST (ELECTRODE) IMPLANT
ELECT REM PT RETURN 9FT ADLT (ELECTROSURGICAL) ×3
ELECTRODE REM PT RTRN 9FT ADLT (ELECTROSURGICAL) ×1 IMPLANT
GAUZE SPONGE 4X4 12PLY STRL (GAUZE/BANDAGES/DRESSINGS) ×3 IMPLANT
GAUZE SPONGE 4X4 16PLY XRAY LF (GAUZE/BANDAGES/DRESSINGS) IMPLANT
GLOVE BIO SURGEON STRL SZ 6 (GLOVE) ×3 IMPLANT
GLOVE BIO SURGEON STRL SZ 6.5 (GLOVE) IMPLANT
GLOVE BIO SURGEONS STRL SZ 6.5 (GLOVE)
GLOVE BIOGEL PI IND STRL 6.5 (GLOVE) ×1 IMPLANT
GLOVE BIOGEL PI IND STRL 7.0 (GLOVE) ×1 IMPLANT
GLOVE BIOGEL PI INDICATOR 6.5 (GLOVE) ×2
GLOVE BIOGEL PI INDICATOR 7.0 (GLOVE) ×2
GLOVE INDICATOR 6.5 STRL GRN (GLOVE) ×4 IMPLANT
GOWN STRL REUS W/ TWL XL LVL3 (GOWN DISPOSABLE) ×3 IMPLANT
GOWN STRL REUS W/TWL 2XL LVL3 (GOWN DISPOSABLE) ×3 IMPLANT
GOWN STRL REUS W/TWL XL LVL3 (GOWN DISPOSABLE) ×3
KIT BASIN OR (CUSTOM PROCEDURE TRAY) ×3 IMPLANT
LIQUID BAND (GAUZE/BANDAGES/DRESSINGS) IMPLANT
MARKER SKIN DUAL TIP RULER LAB (MISCELLANEOUS) ×3 IMPLANT
NDL HYPO 25X1 1.5 SAFETY (NEEDLE) ×1 IMPLANT
NEEDLE HYPO 22GX1.5 SAFETY (NEEDLE) IMPLANT
NEEDLE HYPO 25X1 1.5 SAFETY (NEEDLE) ×3 IMPLANT
PACK BASIC VI WITH GOWN DISP (CUSTOM PROCEDURE TRAY) ×3 IMPLANT
PENCIL BUTTON HOLSTER BLD 10FT (ELECTRODE) ×3 IMPLANT
STRIP CLOSURE SKIN 1/2X4 (GAUZE/BANDAGES/DRESSINGS) ×2 IMPLANT
SUT MNCRL AB 4-0 PS2 18 (SUTURE) ×3 IMPLANT
SUT VIC AB 3-0 SH 27 (SUTURE)
SUT VIC AB 3-0 SH 27X BRD (SUTURE) IMPLANT
SYR CONTROL 10ML LL (SYRINGE) ×3 IMPLANT
TOWEL OR 17X26 10 PK STRL BLUE (TOWEL DISPOSABLE) ×3 IMPLANT

## 2014-07-08 NOTE — Transfer of Care (Signed)
Immediate Anesthesia Transfer of Care Note  Patient: Jared Bentley  Procedure(s) Performed: Procedure(s): REMOVAL PORT-A-CATH (N/A)  Patient Location: PACU  Anesthesia Type:MAC  Level of Consciousness: awake, alert , oriented and patient cooperative  Airway & Oxygen Therapy: Patient Spontanous Breathing and Patient connected to face mask oxygen  Post-op Assessment: Report given to RN, Post -op Vital signs reviewed and stable and Patient moving all extremities X 4  Post vital signs: stable  Last Vitals:  Filed Vitals:   07/08/14 1339  BP: 111/79  Pulse:   Temp: 36.4 C  Resp: 14    Complications: No apparent anesthesia complications

## 2014-07-08 NOTE — Telephone Encounter (Signed)
S/w Scheduler at Cardiology office.   Dr. Percival Spanish can see pt for new pt appt tomorrow morning 6/3 at 9;30 am at their [facility]. Location.   Called pt and gave him appt information and address for tomorrow morning.  Need to arrive at 9:15 am.  He verbalized understanding.

## 2014-07-08 NOTE — Discharge Instructions (Addendum)
[place] Office Phone Number [phone]   POST OP INSTRUCTIONS  Always review your discharge instruction sheet given to you by the facility where your surgery was performed.  IF YOU HAVE DISABILITY OR FAMILY LEAVE FORMS, YOU MUST BRING THEM TO THE OFFICE FOR PROCESSING.  DO NOT GIVE THEM TO YOUR DOCTOR.  1. A prescription for pain medication may be given to you upon discharge.  Take your pain medication as prescribed, if needed.  If narcotic pain medicine is not needed, then you may take acetaminophen (Tylenol) or ibuprofen (Advil) as needed. 2. Take your usually prescribed medications unless otherwise directed 3. If you need a refill on your pain medication, please contact your pharmacy.  They will contact our office to request authorization.  Prescriptions will not be filled after 5pm or on week-ends. 4. You should eat very light the first 24 hours after surgery, such as soup, crackers, pudding, etc.  Resume your normal diet the day after surgery 5. It is common to experience some constipation if taking pain medication after surgery.  Increasing fluid intake and taking a stool softener will usually help or prevent this problem from occurring.  A mild laxative (Milk of Magnesia or Miralax) should be taken according to package directions if there are no bowel movements after 48 hours. 6. You may shower in 48 hours.  The surgical glue will flake off in 2-3 weeks.   7. ACTIVITIES:  No strenuous activity or heavy lifting for 1 week.   a. You may drive when you no longer are taking prescription pain medication, you can comfortably wear a seatbelt, and you can safely maneuver your car and apply brakes. b. RETURN TO WORK:  __________1 week_______________ Dennis Bast should see your doctor in the office for a follow-up appointment approximately three-four weeks after your surgery.    WHEN TO CALL YOUR DOCTOR: 1. Fever over 101.0 2. Nausea and/or vomiting. 3. Extreme swelling or  bruising. 4. Continued bleeding from incision. 5. Increased pain, redness, or drainage from the incision.  The clinic staff is available to answer your questions during regular business hours.  Please dont hesitate to call and ask to speak to one of the nurses for clinical concerns.  If you have a medical emergency, go to the nearest emergency room or call 911.  A surgeon from [facility] is always on call at the hospital.  For further questions, please visit [email or web address]          Monitored Anesthesia Care Monitored anesthesia care is an anesthesia service for a medical procedure. Anesthesia is the loss of the ability to feel pain. It is produced by medicines called anesthetics. It may affect a small area of your body (local anesthesia), a large area of your body (regional anesthesia), or your entire body (general anesthesia). The need for monitored anesthesia care depends your procedure, your condition, and the potential need for regional or general anesthesia. It is often provided during procedures where:   General anesthesia may be needed if there are complications. This is because you need special care when you are under general anesthesia.   You will be under local or regional anesthesia. This is so that you are able to have higher levels of anesthesia if needed.   You will receive calming medicines (sedatives). This is especially the case if sedatives are given to put you in a semi-conscious state of relaxation (deep sedation). This is because the amount of sedative needed to produce this state can be  hard to predict. Too much of a sedative can produce general anesthesia. Monitored anesthesia care is performed by one or more health care providers who have special training in all types of anesthesia. You will need to meet with these health care providers before your procedure. During this meeting, they will ask you about your medical history. They will also  give you instructions to follow. (For example, you will need to stop eating and drinking before your procedure. You may also need to stop or change medicines you are taking.) During your procedure, your health care providers will stay with you. They will:   Watch your condition. This includes watching your blood pressure, breathing, and level of pain.   Diagnose and treat problems that occur.   Give medicines if they are needed. These may include calming medicines (sedatives) and anesthetics.   Make sure you are comfortable.  Having monitored anesthesia care does not necessarily mean that you will be under anesthesia. It does mean that your health care providers will be able to manage anesthesia if you need it or if it occurs. It also means that you will be able to have a different type of anesthesia than you are having if you need it. When your procedure is complete, your health care providers will continue to watch your condition. They will make sure any medicines wear off before you are allowed to go home.  Document Released: 10/18/2004 Document Revised: 06/08/2013 Document Reviewed: 03/05/2012 [facility] Patient Information 2015 [place], [place]. This information is not intended to replace advice given to you by your health care provider. Make sure you discuss any questions you have with your health care provider.

## 2014-07-08 NOTE — H&P (Signed)
Jared Bentley is an 59 y.o. male.   Chief Complaint: lymphoma HPI:  Pt is a 59 yo male s/p port a cath placement for lymphoma.  He has completed therapy and had follow up with oncology.  He is in remission at this point and desires removal.    Past Medical History  Diagnosis Date  . Hypertension   . Chronic back pain   . ED (erectile dysfunction)   . Glucose intolerance (impaired glucose tolerance)   . Anxiety   . Sleep apnea     severe OSA-uses a cpap  . Heavy alcohol consumption     6-12 beers daily  . Carpal tunnel syndrome   . Cancer     NHL  . Diffuse large B cell lymphoma 12/30/2013    Past Surgical History  Procedure Laterality Date  . Vasectomy    . Colonoscopy w/ polypectomy    . Wisdom tooth extraction    . Lymph node biopsy Left 12/22/2013    Procedure: LEFT INGUINAL LYMPH NODE BIOPSY;  Surgeon: [name], MD;  Location: [place];  Service: General;  Laterality: Left;  . Portacath placement N/A 01/06/2014    Procedure: INSERTION PORT-A-CATH;  Surgeon: [name], MD;  Location: [place];  Service: General;  Laterality: N/A;    Family History  Problem Relation Age of Onset  . Cancer Father     bone  . Cancer Mother     tongue ca   Social History:  reports that he quit smoking about 10 years ago. He has never used smokeless tobacco. He reports that he drinks alcohol. He reports that he does not use illicit drugs.  Allergies: No Known Allergies  Medications Prior to Admission  Medication Sig Dispense Refill  . Atorvastatin Calcium (INVESTIGATIONAL ATORVASTATIN/PLACEBO) 40 MG tablet [facility] [id number] Take 1 tablet by mouth daily. Take 2 doses (these doses must be 12 hours apart) prior to first chemotherapy treatment. Then take 1 tablet daily by mouth with or without food. 180 tablet 0  . enalapril (VASOTEC) 20 MG tablet Take 1 tablet (20 mg total) by mouth at bedtime. (Patient taking differently: Take 20 mg by mouth every  morning. ) 90 tablet 3  . aspirin EC 81 MG tablet Take 1 tablet (81 mg total) by mouth daily.    . Atorvastatin Calcium (INVESTIGATIONAL ATORVASTATIN/PLACEBO) 40 MG tablet [facility] [phone] Take 1 tablet by mouth daily. Take 1 tablet daily with or without food. 180 tablet 0    No results found for this or any previous visit (from the past 48 hour(s)). No results found.  Review of Systems  All other systems reviewed and are negative.   Blood pressure 132/83, pulse 73, temperature 98 F (36.7 C), temperature source Oral, resp. rate 18, height 5\' 9"  (1.753 m), weight 125.958 kg (277 lb 11 oz), SpO2 97 %. Physical Exam  Constitutional: He is oriented to person, place, and time. He appears well-developed and well-nourished. No distress.  HENT:  Head: Normocephalic and atraumatic.  Eyes: Conjunctivae are normal. Pupils are equal, round, and reactive to light. No scleral icterus.  Cardiovascular: Normal rate.   Respiratory: Effort normal. No respiratory distress.  Port a cath in left chest wall  Musculoskeletal: Normal range of motion.  Neurological: He is alert and oriented to person, place, and time.  Skin: Skin is warm and dry.  Psychiatric: He has a normal mood and affect. His behavior is normal. Judgment and thought  content normal.     Assessment/Plan History of lymphoma Plan port removal. Discussed risks of procedure.  Pt agrees to proceed.    Jared Bentley 07/08/2014, 11:55 AM

## 2014-07-08 NOTE — Anesthesia Preprocedure Evaluation (Addendum)
Anesthesia Evaluation  Patient identified by MRN, date of birth, ID band Patient awake    Reviewed: Allergy & Precautions, NPO status , Patient's Chart, lab work & pertinent test results  Airway Mallampati: II  TM Distance: >3 FB Neck ROM: Full    Dental no notable dental hx.    Pulmonary sleep apnea , former smoker,  breath sounds clear to auscultation  Pulmonary exam normal       Cardiovascular Exercise Tolerance: Good hypertension, Pt. on medications Normal cardiovascular examRhythm:Regular Rate:Normal  Walks a mile per day.   Neuro/Psych PSYCHIATRIC DISORDERS Anxiety  Neuromuscular disease    GI/Hepatic negative GI ROS, (+)     substance abuse  alcohol use,   Endo/Other  Morbid obesity  Renal/GU negative Renal ROS  negative genitourinary   Musculoskeletal negative musculoskeletal ROS (+)   Abdominal (+) + obese,   Peds negative pediatric ROS (+)  Hematology  (+) anemia ,   Anesthesia Other Findings   Reproductive/Obstetrics negative OB ROS                           Anesthesia Physical Anesthesia Plan  ASA: III  Anesthesia Plan: MAC   Post-op Pain Management:    Induction: Intravenous  Airway Management Planned: Natural Airway  Additional Equipment:   Intra-op Plan:   Post-operative Plan:   Informed Consent: I have reviewed the patients History and Physical, chart, labs and discussed the procedure including the risks, benefits and alternatives for the proposed anesthesia with the patient or authorized representative who has indicated his/her understanding and acceptance.   Dental advisory given  Plan Discussed with: CRNA  Anesthesia Plan Comments: (MAC with GA backup.)       Anesthesia Quick Evaluation

## 2014-07-08 NOTE — Op Note (Signed)
  PRE-OPERATIVE DIAGNOSIS:  un-needed Port-A-Cath for lymphoma  POST-OPERATIVE DIAGNOSIS:  Same   PROCEDURE:  Procedure(s):  REMOVAL PORT-A-CATH  SURGEON:  Surgeon(s):  [name], MD  ANESTHESIA:   MAC + local  EBL:   Minimal  SPECIMEN:  None  Complications : none known  Procedure:   Pt was  identified in the holding area and taken to the operating room where she was placed supine on the operating room table.  MAC anesthesia was induced.  The left upper chest was prepped and draped.  The prior incision was anesthetized with local anesthetic.  The incision was opened with a #15 blade.  The subcutaneous tissue was divided with the cautery.  The port was identified and the capsule opened.  The four 2-0 prolene sutures were removed.  The port was then removed and pressure held on the tract.  The catheter appeared intact without evidence of breakage at 26.5 cm. The wound was inspected for hemostasis, which was achieved with cautery.  The wound was closed with 3-0 vicryl deep dermal interrupted sutures and 4-0 Monocryl running subcuticular suture.  The wound was cleaned, dried, and dressed with dermabond.  The patient was awakened from anesthesia and taken to the PACU in stable condition.  Needle, sponge, and instrument counts are correct.

## 2014-07-08 NOTE — Anesthesia Postprocedure Evaluation (Addendum)
  Anesthesia Post-op Note  Patient: Jared Bentley  Procedure(s) Performed: Procedure(s) (LRB): REMOVAL PORT-A-CATH (N/A)  Patient Location: PACU  Anesthesia Type: MAC  Level of Consciousness: awake and alert   Airway and Oxygen Therapy: Patient Spontanous Breathing  Post-op Pain: mild  Post-op Assessment: Post-op Vital signs reviewed, Patient's Cardiovascular Status Stable, Respiratory Function Stable, Patent Airway and No signs of Nausea or vomiting  Last Vitals:  Filed Vitals:   07/08/14 1435  BP: 132/85  Pulse: 71  Temp: 36.5 C  Resp: 16    Post-op Vital Signs: stable   Complications: No apparent anesthesia complications. Patient reminded of sleep apnea precautions tonight.

## 2014-07-08 NOTE — Telephone Encounter (Signed)
Thanks

## 2014-07-08 NOTE — Addendum Note (Signed)
Addendum  created 07/08/14 1520 by Franne Grip, MD   Modules edited: Notes Section   Notes Section:  File: [id number]

## 2014-07-09 ENCOUNTER — Encounter (HOSPITAL_COMMUNITY): Payer: Self-pay | Admitting: General Surgery

## 2014-07-09 ENCOUNTER — Ambulatory Visit (INDEPENDENT_AMBULATORY_CARE_PROVIDER_SITE_OTHER): Payer: BLUE CROSS/BLUE SHIELD | Admitting: Cardiology

## 2014-07-09 VITALS — BP 110/70 | HR 90 | Ht 72.0 in | Wt 279.3 lb

## 2014-07-09 DIAGNOSIS — R0789 Other chest pain: Secondary | ICD-10-CM

## 2014-07-09 NOTE — Patient Instructions (Signed)
Your physician recommends that you schedule a follow-up appointment in: as needed with dr. Percival Spanish  We are ordering a stress test for you to get done

## 2014-07-09 NOTE — Progress Notes (Signed)
Cardiology Office Note   Date:  07/09/2014   ID:  Jared Bentley, DOB Jul 06, 1955, MRN [id number]  PCP:  Jared Hillier, MD  Cardiologist:   Jared Breeding, MD   No chief complaint on file.     History of Present Illness: Jared Bentley is a 59 y.o. male who presents for evaluation of an elevated cardiac enzyme. He has no past cardiac history. He has recently been treated with doxorubicin for lymphoma. He was part of a clinical trial and I have reviewed some of the data regarding this. This apparently was a study to demonstrate whether patients would have less toxicity with doxorubicin if they took Lipitor prior to it. They were measuring troponins as an endpoint. He did have a an echocardiogram which demonstrated a well preserved ejection fractionprior to chemotherapy. He reports recently however his troponin was slightly elevated when it was checked at [facility].  His repeat blood pressure done here was minimally elevated at 0.05. The patient has completed chemotherapy. He denies any cardiovascular symptoms however. He does a lot of work around his [place] acres. He denies any chest pressure, neck or arm discomfort. He has no shortness of breath, PND or orthopnea. He has no palpitations, presyncope or syncope. He has no weight gain or edema.   Past Medical History  Diagnosis Date  . Hypertension   . Chronic back pain   . ED (erectile dysfunction)   . Glucose intolerance (impaired glucose tolerance)   . Anxiety   . Sleep apnea     severe OSA-uses a cpap  . Heavy alcohol consumption     6-12 beers daily  . Carpal tunnel syndrome   . Diffuse large B cell lymphoma 12/30/2013    Past Surgical History  Procedure Laterality Date  . Vasectomy    . Colonoscopy w/ polypectomy    . Wisdom tooth extraction    . Lymph node biopsy Left 12/22/2013    Procedure: LEFT INGUINAL LYMPH NODE BIOPSY;  Surgeon: [name], MD;  Location: [place];  Service: General;  Laterality: Left;  .  Portacath placement N/A 01/06/2014    Procedure: INSERTION PORT-A-CATH;  Surgeon: [name], MD;  Location: [place];  Service: General;  Laterality: N/A;  . Port-a-cath removal N/A 07/08/2014    Procedure: REMOVAL PORT-A-CATH;  Surgeon: [name], MD;  Location: [facility];  Service: General;  Laterality: N/A;     Current Outpatient Prescriptions  Medication Sig Dispense Refill  . aspirin EC 81 MG tablet Take 1 tablet (81 mg total) by mouth daily.    . Atorvastatin Calcium (INVESTIGATIONAL ATORVASTATIN/PLACEBO) 40 MG tablet [facility] [phone] Take 1 tablet by mouth daily. Take 2 doses (these doses must be 12 hours apart) prior to first chemotherapy treatment. Then take 1 tablet daily by mouth with or without food. 180 tablet 0  . Atorvastatin Calcium (INVESTIGATIONAL ATORVASTATIN/PLACEBO) 40 MG tablet [facility] [phone] Take 1 tablet by mouth daily. Take 1 tablet daily with or without food. 180 tablet 0  . enalapril (VASOTEC) 20 MG tablet Take 1 tablet (20 mg total) by mouth at bedtime. (Patient taking differently: Take 20 mg by mouth every morning. ) 90 tablet 3   No current facility-administered medications for this visit.    Allergies:   Review of patient's allergies indicates no known allergies.    Social History:  The patient  reports that he quit smoking about 10 years ago. He has never used smokeless  tobacco. He reports that he drinks alcohol. He reports that he does not use illicit drugs.   Family History:  The patient's family history includes Cancer in his father and mother.    ROS:  Please see the history of present illness.   Otherwise, review of systems are positive for none.   All other systems are reviewed and negative.    PHYSICAL EXAM: VS:  BP 110/70 mmHg  Pulse 90  Ht 6' (1.829 m)  Wt 279 lb 4.8 oz (126.69 kg)  BMI 37.87 kg/m2 , BMI Body mass index is 37.87 kg/(m^2). GENERAL:  Well appearing HEENT:  Pupils equal round and reactive, fundi not  visualized, oral mucosa unremarkable NECK:  No jugular venous distention, waveform within normal limits, carotid upstroke brisk and symmetric, no bruits, no thyromegaly LYMPHATICS:  No cervical, inguinal adenopathy LUNGS:  Clear to auscultation bilaterally BACK:  No CVA tenderness CHEST:  Unremarkable HEART:  PMI not displaced or sustained,S1 and S2 within normal limits, no S3, no S4, no clicks, no rubs, no murmurs ABD:  Flat, positive bowel sounds normal in frequency in pitch, no bruits, no rebound, no guarding, no midline pulsatile mass, no hepatomegaly, no splenomegaly EXT:  2 plus pulses throughout, no edema, no cyanosis no clubbing SKIN:  No rashes no nodules NEURO:  Cranial nerves II through XII grossly intact, motor grossly intact throughout PSYCH:  Cognitively intact, oriented to person place and time    EKG:  EKG is not ordered today. The ekg ordered 5/16 demonstrates normal sinus rhythm, rate 78, axis within normal limits, intervals within normal limits, no acute ST-T wave changes.   Recent Labs: 06/30/2014: ALT 42; BUN 12.3; Creatinine 0.9; Hemoglobin 14.4; Platelets 197; Potassium 4.4; Sodium 140    Lipid Panel    Component Value Date/Time   CHOL 198 06/30/2013 0951   TRIG 69 06/30/2013 0951   HDL 63 06/30/2013 0951   CHOLHDL 3.1 06/30/2013 0951   VLDL 14 06/30/2013 0951   LDLCALC 121* 06/30/2013 0951      Wt Readings from Last 3 Encounters:  07/09/14 279 lb 4.8 oz (126.69 kg)  07/08/14 277 lb 11 oz (125.958 kg)  06/30/14 277 lb 11.2 oz (125.964 kg)      Other studies Reviewed: Additional studies/ records that were reviewed today include: Oncology records.  Research records. Review of the above records demonstrates:  Please see elsewhere in the note.     ASSESSMENT AND PLAN:  ELEVATED TROPONIN:  He had a minimally elevated troponin. I'll try to get the previous one for comparison. I don't suspect any acute coronary syndrome. I have a low pretest suspicion  that he has obstructive coronary disease. I would not suspect that he has a new reduced ejection fraction. I will screen him however with a I will bring the patient back for a POET (Plain Old Exercise Test). This will allow me to screen for obstructive coronary disease, risk stratify and very importantly provide a prescription for exercise.  ETOH:  We had a long discussion about the need to cut down on the use of his alcohol.  Current medicines are reviewed at length with the patient today.  The patient does not have concerns regarding medicines.  The following changes have been made:  no change  Labs/ tests ordered today include:   Orders Placed This Encounter  Procedures  . Exercise Tolerance Test     Disposition:   FU with me as needed.      Signed, Jeneen Rinks  [place] Throckmorton, MD  07/09/2014 12:38 PM    [place]

## 2014-07-15 ENCOUNTER — Other Ambulatory Visit: Payer: Self-pay | Admitting: *Deleted

## 2014-07-27 DIAGNOSIS — G5600 Carpal tunnel syndrome, unspecified upper limb: Secondary | ICD-10-CM | POA: Insufficient documentation

## 2014-07-28 ENCOUNTER — Encounter (HOSPITAL_COMMUNITY)
Admission: RE | Admit: 2014-07-28 | Discharge: 2014-07-28 | Disposition: A | Discharge status: Home / Self Care | Payer: BLUE CROSS/BLUE SHIELD | Source: Ambulatory Visit | Attending: Orthopaedic Surgery | Admitting: Orthopaedic Surgery

## 2014-07-28 ENCOUNTER — Encounter (HOSPITAL_COMMUNITY): Payer: Self-pay

## 2014-07-28 ENCOUNTER — Other Ambulatory Visit: Payer: Self-pay | Admitting: Radiology

## 2014-07-28 NOTE — H&P (Signed)
Jared Bentley is an 59 y.o. male.   Chief Complaint: Carpal Tunnel Syndrome Bilaterally, more on the left HPI: He has had night paresthesias in the median nerve distribution for some time now.  The left wrist hurts more.  It is now bothering him during the day doing normal activities.  He says the numbness is painful now and interfering with normal activities.  EMGs done on June 16 show bilateral carpal tunnel syndrome.  He would like to have surgical correction of the left wrist first.  I have gone over the risks and imponderables of the surgery.  It is an elective outpatient procedure.  He elects to have it done tomorrow.  Past Medical History  Diagnosis Date  . Hypertension   . Chronic back pain   . ED (erectile dysfunction)   . Glucose intolerance (impaired glucose tolerance)   . Anxiety   . Sleep apnea     severe OSA-uses a cpap  . Heavy alcohol consumption     6-12 beers daily  . Carpal tunnel syndrome   . Diffuse large B cell lymphoma 12/30/2013    Past Surgical History  Procedure Laterality Date  . Vasectomy    . Colonoscopy w/ polypectomy    . Wisdom tooth extraction    . Lymph node biopsy Left 12/22/2013    Procedure: LEFT INGUINAL LYMPH NODE BIOPSY;  Surgeon: Jared Klein, MD;  Location: [place];  Service: General;  Laterality: Left;  . Portacath placement N/A 01/06/2014    Procedure: INSERTION PORT-A-CATH;  Surgeon: Jared Klein, MD;  Location: [place];  Service: General;  Laterality: N/A;  . Port-a-cath removal N/A 07/08/2014    Procedure: REMOVAL PORT-A-CATH;  Surgeon: Jared Klein, MD;  Location: [facility];  Service: General;  Laterality: N/A;    Family History  Problem Relation Age of Onset  . Cancer Father     bone  . Cancer Mother     tongue ca   Social History:  reports that he quit smoking about 10 years ago. He has never used smokeless tobacco. He reports that he drinks alcohol. He reports that he does not use illicit  drugs.  Allergies: No Known Allergies  No prescriptions prior to admission    No results found for this or any previous visit (from the past 48 hour(s)). No results found.  Review of Systems  Constitutional:       History of ETOH abuse  Respiratory:       Sleep Apnea  Cardiovascular:       Hypertension  Gastrointestinal:       Elevated liver enzymes  Musculoskeletal: Positive for joint pain (Pain both hands from carpal tunnel).  Endo/Heme/Allergies:       History of diffuse large B cell lymphoma with associated anemia.    There were no vitals taken for this visit. Physical Exam  Constitutional: He is oriented to person, place, and time. He appears well-developed and well-nourished.  HENT:  Head: Normocephalic and atraumatic.  Eyes: Conjunctivae and EOM are normal. Pupils are equal, round, and reactive to light.  Neck: Normal range of motion. Neck supple.  Cardiovascular: Normal rate, regular rhythm and intact distal pulses.   Respiratory: Effort normal.  GI: Soft.  Musculoskeletal: He exhibits tenderness (Pain both wrists, positive Phalen and Tinel bilaterally.  Left wrist hurts more.  ROM is full.).  Neurological: He is alert and oriented to person, place, and time. He has normal reflexes.  Skin: Skin is warm  and dry.  Psychiatric: He has a normal mood and affect. His behavior is normal. Judgment and thought content normal.     Assessment/Plan Bilateral carpal tunnel syndrome, more on the left. For left side open carpal tunnel release as an outpatient.  Jared Bentley 07/28/2014, 11:11 AM

## 2014-07-29 ENCOUNTER — Ambulatory Visit (HOSPITAL_COMMUNITY): Payer: BLUE CROSS/BLUE SHIELD | Admitting: Anesthesiology

## 2014-07-29 ENCOUNTER — Encounter (HOSPITAL_COMMUNITY): Payer: Self-pay | Admitting: *Deleted

## 2014-07-29 ENCOUNTER — Ambulatory Visit (HOSPITAL_COMMUNITY)
Admission: RE | Admit: 2014-07-29 | Discharge: 2014-07-29 | Disposition: A | Discharge status: Home / Self Care | Payer: BLUE CROSS/BLUE SHIELD | Source: Ambulatory Visit | Attending: Orthopaedic Surgery | Admitting: Orthopaedic Surgery

## 2014-07-29 ENCOUNTER — Encounter (HOSPITAL_COMMUNITY)
Admission: RE | Disposition: A | Discharge status: Home / Self Care | Payer: Self-pay | Source: Ambulatory Visit | Attending: Orthopaedic Surgery

## 2014-07-29 DIAGNOSIS — G8929 Other chronic pain: Secondary | ICD-10-CM | POA: Diagnosis not present

## 2014-07-29 DIAGNOSIS — F419 Anxiety disorder, unspecified: Secondary | ICD-10-CM | POA: Diagnosis not present

## 2014-07-29 DIAGNOSIS — I10 Essential (primary) hypertension: Secondary | ICD-10-CM | POA: Diagnosis not present

## 2014-07-29 DIAGNOSIS — G5601 Carpal tunnel syndrome, right upper limb: Secondary | ICD-10-CM | POA: Diagnosis not present

## 2014-07-29 DIAGNOSIS — Z8572 Personal history of non-Hodgkin lymphomas: Secondary | ICD-10-CM | POA: Insufficient documentation

## 2014-07-29 DIAGNOSIS — E7439 Other disorders of intestinal carbohydrate absorption: Secondary | ICD-10-CM | POA: Diagnosis not present

## 2014-07-29 DIAGNOSIS — G5602 Carpal tunnel syndrome, left upper limb: Secondary | ICD-10-CM | POA: Insufficient documentation

## 2014-07-29 DIAGNOSIS — M549 Dorsalgia, unspecified: Secondary | ICD-10-CM | POA: Diagnosis not present

## 2014-07-29 DIAGNOSIS — G4733 Obstructive sleep apnea (adult) (pediatric): Secondary | ICD-10-CM | POA: Insufficient documentation

## 2014-07-29 DIAGNOSIS — Z87891 Personal history of nicotine dependence: Secondary | ICD-10-CM | POA: Diagnosis not present

## 2014-07-29 DIAGNOSIS — Z6837 Body mass index (BMI) 37.0-37.9, adult: Secondary | ICD-10-CM | POA: Insufficient documentation

## 2014-07-29 DIAGNOSIS — N529 Male erectile dysfunction, unspecified: Secondary | ICD-10-CM | POA: Insufficient documentation

## 2014-07-29 HISTORY — PX: CARPAL TUNNEL RELEASE: SHX101

## 2014-07-29 SURGERY — CARPAL TUNNEL RELEASE
Anesthesia: Regional | Site: Hand | Laterality: Left

## 2014-07-29 MED ORDER — PROPOFOL 10 MG/ML IV BOLUS
INTRAVENOUS | Status: AC
  Filled 2014-07-29: qty 20

## 2014-07-29 MED ORDER — MIDAZOLAM HCL 2 MG/2ML IJ SOLN
1.0000 mg | INTRAMUSCULAR | Status: DC | PRN
Start: 2014-07-29 — End: 2014-07-29
  Administered 2014-07-29 (×2): 2 mg via INTRAVENOUS
  Filled 2014-07-29: qty 2

## 2014-07-29 MED ORDER — FENTANYL CITRATE (PF) 100 MCG/2ML IJ SOLN: 25.0000 ug | INTRAMUSCULAR | Status: DC | PRN

## 2014-07-29 MED ORDER — LIDOCAINE HCL (PF) 0.5 % IJ SOLN
INTRAMUSCULAR | Status: AC
  Filled 2014-07-29: qty 50

## 2014-07-29 MED ORDER — SODIUM CHLORIDE 0.9 % IJ SOLN
INTRAMUSCULAR | Status: AC
  Filled 2014-07-29: qty 10

## 2014-07-29 MED ORDER — MIDAZOLAM HCL 5 MG/5ML IJ SOLN
INTRAMUSCULAR | Status: DC | PRN
  Administered 2014-07-29 (×2): 1 mg via INTRAVENOUS

## 2014-07-29 MED ORDER — FENTANYL CITRATE (PF) 100 MCG/2ML IJ SOLN
INTRAMUSCULAR | Status: DC | PRN
  Administered 2014-07-29 (×4): 25 ug via INTRAVENOUS

## 2014-07-29 MED ORDER — ONDANSETRON HCL 4 MG/2ML IJ SOLN
INTRAMUSCULAR | Status: AC
  Filled 2014-07-29: qty 2

## 2014-07-29 MED ORDER — PROPOFOL INFUSION 10 MG/ML OPTIME
INTRAVENOUS | Status: DC | PRN
  Administered 2014-07-29: 08:00:00 via INTRAVENOUS
  Administered 2014-07-29: 75 ug/kg/min via INTRAVENOUS

## 2014-07-29 MED ORDER — MIDAZOLAM HCL 2 MG/2ML IJ SOLN
INTRAMUSCULAR | Status: AC
  Filled 2014-07-29: qty 2

## 2014-07-29 MED ORDER — ONDANSETRON HCL 4 MG/2ML IJ SOLN
4.0000 mg | Freq: Once | INTRAMUSCULAR | Status: AC
  Administered 2014-07-29: 4 mg via INTRAVENOUS

## 2014-07-29 MED ORDER — CHLORHEXIDINE GLUCONATE 4 % EX LIQD: 60.0000 mL | Freq: Once | CUTANEOUS | Status: DC

## 2014-07-29 MED ORDER — HYDROCODONE-ACETAMINOPHEN 7.5-325 MG PO TABS
1.0000 | ORAL_TABLET | ORAL | Status: DC | PRN
Start: 2014-07-29 — End: 2014-11-02

## 2014-07-29 MED ORDER — FENTANYL CITRATE (PF) 100 MCG/2ML IJ SOLN
INTRAMUSCULAR | Status: AC
  Filled 2014-07-29: qty 2

## 2014-07-29 MED ORDER — PROPOFOL 10 MG/ML IV BOLUS
INTRAVENOUS | Status: AC
Start: 2014-07-29 — End: 2014-07-29
  Filled 2014-07-29: qty 20

## 2014-07-29 MED ORDER — FENTANYL CITRATE (PF) 100 MCG/2ML IJ SOLN
25.0000 ug | INTRAMUSCULAR | Status: AC
  Administered 2014-07-29 (×2): 25 ug via INTRAVENOUS

## 2014-07-29 MED ORDER — LACTATED RINGERS IV SOLN
INTRAVENOUS | Status: DC
  Administered 2014-07-29: 1000 mL via INTRAVENOUS

## 2014-07-29 MED ORDER — SODIUM CHLORIDE 0.9 % IR SOLN
Status: DC | PRN
  Administered 2014-07-29: 500 mL

## 2014-07-29 MED ORDER — ONDANSETRON HCL 4 MG/2ML IJ SOLN: 4.0000 mg | Freq: Once | INTRAMUSCULAR | Status: DC | PRN

## 2014-07-29 MED ORDER — SODIUM CHLORIDE 0.9 % IJ SOLN
INTRAMUSCULAR | Status: DC | PRN
  Administered 2014-07-29: 2.5 mL via INTRAVENOUS

## 2014-07-29 MED ORDER — LIDOCAINE HCL (PF) 0.5 % IJ SOLN
INTRAMUSCULAR | Status: DC | PRN
  Administered 2014-07-29: 250 mg via INTRAVENOUS

## 2014-07-29 SURGICAL SUPPLY — 40 items
BAG HAMPER (MISCELLANEOUS) ×3 IMPLANT
BANDAGE ELASTIC 3 VELCRO NS (GAUZE/BANDAGES/DRESSINGS) ×3 IMPLANT
BANDAGE ESMARK 4X12 BL STRL LF (DISPOSABLE) ×1 IMPLANT
BLADE 15 SAFETY STRL DISP (BLADE) ×3 IMPLANT
BNDG CMPR 12X4 ELC STRL LF (DISPOSABLE) ×1
BNDG ESMARK 4X12 BLUE STRL LF (DISPOSABLE) ×3
CLOTH BEACON ORANGE TIMEOUT ST (SAFETY) ×3 IMPLANT
COVER LIGHT HANDLE STERIS (MISCELLANEOUS) ×6 IMPLANT
CUFF TOURNIQUET SINGLE 18IN (TOURNIQUET CUFF) ×3 IMPLANT
DRSG XEROFORM 1X8 (GAUZE/BANDAGES/DRESSINGS) ×2 IMPLANT
DURAPREP 26ML APPLICATOR (WOUND CARE) ×3 IMPLANT
ELECT NDL TIP 2.8 STRL (NEEDLE) IMPLANT
ELECT NEEDLE TIP 2.8 STRL (NEEDLE) IMPLANT
ELECT REM PT RETURN 9FT ADLT (ELECTROSURGICAL) ×3
ELECTRODE REM PT RTRN 9FT ADLT (ELECTROSURGICAL) ×1 IMPLANT
FORMALIN 10 PREFIL 120ML (MISCELLANEOUS) ×3 IMPLANT
GAUZE SPONGE 4X4 12PLY STRL (GAUZE/BANDAGES/DRESSINGS) ×3 IMPLANT
GLOVE BIO SURGEON STRL SZ8 (GLOVE) ×3 IMPLANT
GLOVE BIO SURGEON STRL SZ8.5 (GLOVE) ×3 IMPLANT
GLOVE ECLIPSE 6.5 STRL STRAW (GLOVE) ×2 IMPLANT
GLOVE INDICATOR 7.0 STRL GRN (GLOVE) ×2 IMPLANT
GOWN STRL REUS W/TWL LRG LVL3 (GOWN DISPOSABLE) ×4 IMPLANT
GOWN STRL REUS W/TWL XL LVL3 (GOWN DISPOSABLE) ×3 IMPLANT
KIT ROOM TURNOVER APOR (KITS) ×3 IMPLANT
NDL HYPO 18GX1.5 BLUNT FILL (NEEDLE) ×1 IMPLANT
NDL HYPO 27GX1-1/4 (NEEDLE) ×1 IMPLANT
NEEDLE HYPO 18GX1.5 BLUNT FILL (NEEDLE) ×3 IMPLANT
NEEDLE HYPO 27GX1-1/4 (NEEDLE) ×3 IMPLANT
NS IRRIG 1000ML POUR BTL (IV SOLUTION) ×3 IMPLANT
PACK BASIC LIMB (CUSTOM PROCEDURE TRAY) ×3 IMPLANT
PAD ARMBOARD 7.5X6 YLW CONV (MISCELLANEOUS) ×3 IMPLANT
PAD CAST 3X4 CTTN HI CHSV (CAST SUPPLIES) ×1 IMPLANT
PADDING CAST COTTON 3X4 STRL (CAST SUPPLIES) ×3
PADDING WEBRIL 3 STERILE (GAUZE/BANDAGES/DRESSINGS) ×2 IMPLANT
SET BASIN LINEN APH (SET/KITS/TRAYS/PACK) ×3 IMPLANT
SPONGE GAUZE 4X4 12PLY (GAUZE/BANDAGES/DRESSINGS) ×1 IMPLANT
SUT ETHILON 3 0 FSL (SUTURE) ×3 IMPLANT
SYR 3ML LL SCALE MARK (SYRINGE) ×3 IMPLANT
TOWEL OR 17X26 4PK STRL BLUE (TOWEL DISPOSABLE) ×1 IMPLANT
VESSEL LOOPS MAXI RED (MISCELLANEOUS) ×3 IMPLANT

## 2014-07-29 NOTE — Anesthesia Postprocedure Evaluation (Signed)
  Anesthesia Post-op Note  Patient: Jared Bentley  Procedure(s) Performed: Procedure(s): CARPAL TUNNEL RELEASE (Left)  Patient Location: PACU  Anesthesia Type:Bier block  Level of Consciousness: awake, alert , oriented and patient cooperative  Airway and Oxygen Therapy: Patient Spontanous Breathing  Post-op Pain: none  Post-op Assessment: Post-op Vital signs reviewed, Patient's Cardiovascular Status Stable, Respiratory Function Stable, Patent Airway and Pain level controlled              Post-op Vital Signs: Reviewed and stable  Last Vitals:  Filed Vitals:   07/29/14 0912  BP: 101/64  Pulse: 74  Temp: 36.6 C  Resp: 16    Complications: No apparent anesthesia complications

## 2014-07-29 NOTE — Op Note (Signed)
Jared Bentley, Jared Bentley                  ACCOUNT NO.:  [id number]  MEDICAL RECORD NO.:  [id number]  LOCATION:  [facility]                          FACILITY:  [facility]  PHYSICIAN:  J. Sanjuana Kava, M.D. DATE OF BIRTH:  July 07, 1955  DATE OF PROCEDURE:  07/29/2014 DATE OF DISCHARGE:                              OPERATIVE REPORT   PREOPERATIVE DIAGNOSIS:  Carpal tunnel syndrome, left.  POSTOPERATIVE DIAGNOSIS:  Carpal tunnel syndrome, left.  PROCEDURE:  Open release of volar carpal ligament, saline neurolysis, epineurotomy left median nerve.  ANESTHESIA:  Bier block.  TOURNIQUET TIME:  31 minutes.  SURGEON:  J. Sanjuana Kava, M.D.  DRAINS:  No drains.  SPLINT:  No splint.  INDICATION:  The patient is a 59 year old male who has had problems with both hands, both wrist, some numbness in median nerve distribution.  He has had night paresthesias getting progressively worse.  This is bothering him for several years.  EMGs done recently showed severe bilateral carpal tunnel syndrome.  Most of his pain is on the left hand and like to have that hand done at this time.  I have gone over the risks and imponderables of the procedure.  He appears to understand and agrees to the procedure as outlined.  He understands it is an elective procedure, and he wanted to go and get it done as soon as possible.  He saw me in  the office yesterday and said today would be fine to have the surgery.  I told him to wait and have it later but he said he wanted to get it over as soon as possible.  DESCRIPTION OF PROCEDURE:  The patient was seen in the holding area. The left hand was identified as correct surgical site.  A mark was placed on the volar side of his wrist area.  The patient was taken to the operating room, placed supine on the table and hand table attached. He was given Bier block anesthesia.  He was then prepped and draped in usual manner.  We had a generalized time-out identifying the patient as Mr.  Jared Bentley and we are doing his left hand for carpal tunnel release.  All instrumentation was properly positioned and working.  The OR team knew each other.  Jared Bentley was still visible.  An outline for incision was made over the volar carpal ligament area of the wrist and the volar hand proximal approximately.  With careful dissection the median nerve was identified and a vessel loop placed around it proximally.  A groove director was inserted and the volar carpal ligament was incised.  The nerve was markedly compressed.  I was surprised how compressed the nerve was.  We did a saline neurolysis and epineurotomy.  No apparent injury to the nerve.  Retinaculum was cut proximally.  Vessel loop was removed.  The wound then reapproximated using 3-0 nylon in interrupted vertical mattress manner.  Sterile dressing applied.  Bulky dressing applied.  Sheet cotton applied.  Sheet cotton cut dorsally.  A splint bandage applied loosely.  The patient tolerated the procedure well and went to recovery in good condition. Appropriate analgesia given for pain.  He will be seen in my  office in 1 week.  If any difficulties, contact me through the office hospital beeper system.  Jared Bentley thank you.          ______________________________ Lenna Sciara. Sanjuana Kava, M.D.     JWK/MEDQ  D:  07/29/2014  T:  07/29/2014  Job:  [id number]

## 2014-07-29 NOTE — Brief Op Note (Signed)
07/29/2014  8:14 AM  PATIENT:  [name]  59 y.o. male  PRE-OPERATIVE DIAGNOSIS:  left carpal tunnel syndrome  POST-OPERATIVE DIAGNOSIS:  left carpal tunnel syndrome  PROCEDURE:  Procedure(s): CARPAL TUNNEL RELEASE (Left)  SURGEON:  Surgeon(s) and Role:    * Sanjuana Kava, MD - Primary  PHYSICIAN ASSISTANT:   ASSISTANTS: none   ANESTHESIA:   regional  EBL:  Total I/O In: 200 [I.V.:200] Out: -   BLOOD ADMINISTERED:none  DRAINS: none   LOCAL MEDICATIONS USED:  NONE  SPECIMEN:  Source of Specimen:  left volar carpal ligament  DISPOSITION OF SPECIMEN:  PATHOLOGY  COUNTS:  YES  TOURNIQUET:   Total Tourniquet Time Documented: Upper Arm (Left) - 31 minutes Total: Upper Arm (Left) - 31 minutes   DICTATION: .Other Dictation: Dictation Number [id number]  PLAN OF CARE: Discharge to home after PACU  PATIENT DISPOSITION:  PACU - hemodynamically stable.   Delay start of Pharmacological VTE agent (>24hrs) due to surgical blood loss or risk of bleeding: not applicable

## 2014-07-29 NOTE — Anesthesia Preprocedure Evaluation (Signed)
Anesthesia Evaluation  Patient identified by MRN, date of birth, ID band Patient awake    Reviewed: Allergy & Precautions, NPO status , Patient's Chart, lab work & pertinent test results  Airway Mallampati: II  TM Distance: >3 FB Neck ROM: Full    Dental no notable dental hx.    Pulmonary sleep apnea , former smoker,  breath sounds clear to auscultation  Pulmonary exam normal       Cardiovascular Exercise Tolerance: Good hypertension, Pt. on medications Normal cardiovascular examRhythm:Regular Rate:Normal  Walks a mile per day.   Neuro/Psych PSYCHIATRIC DISORDERS Anxiety  Neuromuscular disease    GI/Hepatic negative GI ROS, (+)     substance abuse  alcohol use,   Endo/Other  Morbid obesity  Renal/GU negative Renal ROS  negative genitourinary   Musculoskeletal negative musculoskeletal ROS (+)   Abdominal (+) + obese,   Peds negative pediatric ROS (+)  Hematology  (+) anemia ,   Anesthesia Other Findings   Reproductive/Obstetrics negative OB ROS                             Anesthesia Physical Anesthesia Plan  ASA: III  Anesthesia Plan: Bier Block   Post-op Pain Management:    Induction: Intravenous  Airway Management Planned: Simple Face Mask  Additional Equipment:   Intra-op Plan:   Post-operative Plan:   Informed Consent: I have reviewed the patients History and Physical, chart, labs and discussed the procedure including the risks, benefits and alternatives for the proposed anesthesia with the patient or authorized representative who has indicated his/her understanding and acceptance.     Plan Discussed with:   Anesthesia Plan Comments:         Anesthesia Quick Evaluation

## 2014-07-29 NOTE — Progress Notes (Signed)
The History and Physical is unchanged. I have examined the patient. The patient is medically able to have surgery on the left volar wrist . Jeanae Whitmill  

## 2014-07-29 NOTE — Transfer of Care (Signed)
Immediate Anesthesia Transfer of Care Note  Patient: Jared Bentley  Procedure(s) Performed: Procedure(s): CARPAL TUNNEL RELEASE (Left)  Patient Location: PACU  Anesthesia Type:Bier block  Level of Consciousness: awake, alert , oriented and patient cooperative  Airway & Oxygen Therapy: Patient Spontanous Breathing and Patient connected to face mask oxygen  Post-op Assessment: Report given to RN, Post -op Vital signs reviewed and stable and Patient moving all extremities  Post vital signs: Reviewed and stable  Last Vitals:  Filed Vitals:   07/29/14 0730  BP: 114/71  Pulse:   Temp:   Resp: 12    Complications: No apparent anesthesia complications

## 2014-07-29 NOTE — Anesthesia Procedure Notes (Signed)
Anesthesia Regional Block:  Bier block (IV Regional)  Pre-Anesthetic Checklist: ,,, Correct Patient, Correct Site, Correct Laterality, Correct Procedure, Correct Position, site marked, risks and benefits discussed, Surgical consent, Pre-op evaluation  Laterality: Right     Needles:  Injection technique: Single-shot      Needle Gauge: 22 and 22 G    Additional Needles:  Motor weakness within 2 minutes. Bier block (IV Regional) Narrative:  Start time: 07/29/2014 7:42 AM  Performed by: Personally

## 2014-07-29 NOTE — Discharge Instructions (Signed)
Elevate left hand as needed.  Keep dressing dry.  Do not remove dressing.  Move fingers often.  Call Dr. Luna Glasgow at [phone] if any problem, or if after hours, the hospital at [phone].  Keep appointment with Dr. Luna Glasgow in one week.  Incision Care An incision is when a surgeon cuts into your body tissues. After surgery, the incision needs to be cared for properly to prevent infection.  HOME CARE INSTRUCTIONS   Take all medicine as directed by your caregiver. Only take over-the-counter or prescription medicines for pain, discomfort, or fever as directed by your caregiver.  Do not remove your bandage (dressing) or get your incision wet until your surgeon gives you permission. In the event that your dressing becomes wet, dirty, or starts to smell, change the dressing and call your surgeon for instructions as soon as possible.  Take showers. Do not take tub baths, swim, or do anything that may soak the wound until it is healed.  Resume your normal diet and activities as directed or allowed.  Avoid lifting any weight until you are instructed otherwise.  Use anti-itch antihistamine medicine as directed by your caregiver. The wound may itch when it is healing. Do not pick or scratch at the wound.  Follow up with your caregiver for stitch (suture) or staple removal as directed.  Drink enough fluids to keep your urine clear or pale yellow. SEEK MEDICAL CARE IF:   You have redness, swelling, or increasing pain in the wound that is not controlled with medicine.  You have drainage, blood, or pus coming from the wound that lasts longer than 1 day.  You develop muscle aches, chills, or a general ill feeling.  You notice a bad smell coming from the wound or dressing.  Your wound edges separate after the sutures, staples, or skin adhesive strips have been removed.  You develop persistent nausea or vomiting. SEEK IMMEDIATE MEDICAL CARE IF:   You have a fever.  You develop a  rash.  You develop dizzy episodes or faint while standing.  You have difficulty breathing.  You develop any reaction or side effects to medicine given. MAKE SURE YOU:   Understand these instructions.  Will watch your condition.  Will get help right away if you are not doing well or get worse. Document Released: 08/11/2004 Document Revised: 04/16/2011 Document Reviewed: 03/18/2013 [facility] Patient Information 2015 [place], [place]. This information is not intended to replace advice given to you by your health care provider. Make sure you discuss any questions you have with your health care provider.

## 2014-08-02 ENCOUNTER — Encounter (HOSPITAL_COMMUNITY): Payer: Self-pay | Admitting: Orthopaedic Surgery

## 2014-08-04 DIAGNOSIS — Z6836 Body mass index (BMI) 36.0-36.9, adult: Secondary | ICD-10-CM | POA: Insufficient documentation

## 2014-08-04 DIAGNOSIS — I10 Essential (primary) hypertension: Secondary | ICD-10-CM | POA: Insufficient documentation

## 2014-08-10 ENCOUNTER — Telehealth (HOSPITAL_COMMUNITY): Payer: Self-pay

## 2014-08-10 NOTE — Telephone Encounter (Signed)
Encounter complete. 

## 2014-08-12 ENCOUNTER — Ambulatory Visit (HOSPITAL_COMMUNITY)
Admission: RE | Admit: 2014-08-12 | Discharge: 2014-08-12 | Disposition: A | Discharge status: Home / Self Care | Payer: BLUE CROSS/BLUE SHIELD | Source: Ambulatory Visit | Attending: Internal Medicine | Admitting: Internal Medicine

## 2014-08-12 DIAGNOSIS — R0789 Other chest pain: Secondary | ICD-10-CM | POA: Insufficient documentation

## 2014-08-12 LAB — EXERCISE TOLERANCE TEST
CHL CUP MPHR: 161 {beats}/min
Estimated workload: 10.1 METS
Exercise duration (min): 9 min
Exercise duration (sec): 1 s
Peak HR: 173 {beats}/min
Percent HR: 107 %
RPE: 15
Rest HR: 83 {beats}/min

## 2014-08-25 ENCOUNTER — Encounter: Payer: Self-pay | Admitting: Cardiology

## 2014-09-07 ENCOUNTER — Ambulatory Visit (HOSPITAL_BASED_OUTPATIENT_CLINIC_OR_DEPARTMENT_OTHER): Payer: BLUE CROSS/BLUE SHIELD | Admitting: Hematology and Oncology

## 2014-09-07 ENCOUNTER — Telehealth: Payer: Self-pay | Admitting: Hematology and Oncology

## 2014-09-07 ENCOUNTER — Encounter: Payer: Self-pay | Admitting: Hematology and Oncology

## 2014-09-07 ENCOUNTER — Other Ambulatory Visit (HOSPITAL_BASED_OUTPATIENT_CLINIC_OR_DEPARTMENT_OTHER): Payer: BLUE CROSS/BLUE SHIELD

## 2014-09-07 ENCOUNTER — Encounter: Payer: Self-pay | Admitting: *Deleted

## 2014-09-07 VITALS — BP 135/82 | HR 73 | Temp 98.3°F | Resp 20 | Ht 72.0 in | Wt 277.9 lb

## 2014-09-07 DIAGNOSIS — C8330 Diffuse large B-cell lymphoma, unspecified site: Secondary | ICD-10-CM

## 2014-09-07 DIAGNOSIS — Z006 Encounter for examination for normal comparison and control in clinical research program: Secondary | ICD-10-CM

## 2014-09-07 DIAGNOSIS — R21 Rash and other nonspecific skin eruption: Secondary | ICD-10-CM

## 2014-09-07 LAB — COMPREHENSIVE METABOLIC PANEL (CC13)
ALT: 48 U/L (ref 0–55)
AST: 37 U/L — AB (ref 5–34)
Albumin: 3.7 g/dL (ref 3.5–5.0)
Alkaline Phosphatase: 67 U/L (ref 40–150)
Anion Gap: 6 mEq/L (ref 3–11)
BUN: 13.6 mg/dL (ref 7.0–26.0)
CALCIUM: 9.5 mg/dL (ref 8.4–10.4)
CO2: 25 mEq/L (ref 22–29)
CREATININE: 1 mg/dL (ref 0.7–1.3)
Chloride: 108 mEq/L (ref 98–109)
EGFR: 80 mL/min/{1.73_m2} — AB (ref >90)
Glucose: 108 mg/dl (ref 70–140)
POTASSIUM: 4.5 meq/L (ref 3.5–5.1)
Sodium: 140 mEq/L (ref 136–145)
TOTAL PROTEIN: 6.5 g/dL (ref 6.4–8.3)
Total Bilirubin: 0.61 mg/dL (ref 0.20–1.20)

## 2014-09-07 LAB — CBC WITH DIFFERENTIAL/PLATELET
BASO%: 0.7 % (ref 0.0–2.0)
BASOS ABS: 0 10*3/uL (ref 0.0–0.1)
EOS%: 3.5 % (ref 0.0–7.0)
Eosinophils Absolute: 0.2 10*3/uL (ref 0.0–0.5)
HCT: 41.7 % (ref 38.4–49.9)
HGB: 14.2 g/dL (ref 13.0–17.1)
LYMPH#: 0.7 10*3/uL — AB (ref 0.9–3.3)
LYMPH%: 14 % (ref 14.0–49.0)
MCH: 32.6 pg (ref 27.2–33.4)
MCHC: 34.2 g/dL (ref 32.0–36.0)
MCV: 95.5 fL (ref 79.3–98.0)
MONO#: 0.7 10*3/uL (ref 0.1–0.9)
MONO%: 12.8 % (ref 0.0–14.0)
NEUT#: 3.7 10*3/uL (ref 1.5–6.5)
NEUT%: 69 % (ref 39.0–75.0)
Platelets: 187 10*3/uL (ref 140–400)
RBC: 4.36 10*6/uL (ref 4.20–5.82)
RDW: 13.6 % (ref 11.0–14.6)
WBC: 5.3 10*3/uL (ref 4.0–10.3)

## 2014-09-07 LAB — LACTATE DEHYDROGENASE (CC13): LDH: 178 U/L (ref 125–245)

## 2014-09-07 NOTE — Assessment & Plan Note (Signed)
He has very mild skin rash associated with dry skin and mild dermatitis. I recommend skin moisturizer.  He also has a very mild psoriasis on his hand which can be observed The nails changes should improve over time.

## 2014-09-07 NOTE — Progress Notes (Signed)
09/07/2014 0845  PREVENT study Met briefly with patient after his visit with Dr.Gorsuch to obtain medication calendars for May, June, and July.  He reports that he has not missed taking any study drug.  He denies myalgias or other complaints.  He said he saw a cardiologist in June for evaluation since he had an elevated Troponin I at 6 month visit.  He reports that he had an exercise test done and was cleared by cardiology. The patient had no questions and verbalized understanding of next study visit for 12 month time frame.  He is scheduled to come back in November. This RN thanked the patient for his continued dedication to the PREVENT study. Marcellus Scott, RN

## 2014-09-07 NOTE — Assessment & Plan Note (Signed)
Clinically, he had no signs of disease. Recent PET CT scan showed complete remission. The patient will continue to be enrolled in clinical trial. I will see him back in 3 months with history, physical examination and blood work. I plan to defer imaging study to the future only as needed if there are clinical signs suspicious for disease recurrence. I do not recommend routine surveillance imaging for him.

## 2014-09-07 NOTE — Telephone Encounter (Signed)
per pof to sch pt appt-gave pt copy of avs °

## 2014-09-07 NOTE — Progress Notes (Signed)
[facility] OFFICE PROGRESS NOTE  Patient Care Team: Mikey Kirschner, MD as PCP - General (Family Medicine) Fay Records, MD as Consulting Physician (Cardiology)  SUMMARY OF ONCOLOGIC HISTORY: Oncology History   Diffuse large B cell lymphoma   Staging form: Lymphoid Neoplasms, AJCC 6th Edition     Clinical stage from 12/30/2013: Stage III - Signed by Heath Lark, MD on 01/08/2014     Pathologic: No stage assigned - Unsigned       Diffuse large B cell lymphoma   12/14/2013 Imaging CT scan of the abdomen and pelvis showed bulky left iliac chain and left inguinal adenopathy, highly worrisome for lymphoma.   12/22/2013 Surgery He underwent excisional lymph node biopsy of the left inguinal region that confirm lymphoma diagnosis. No   12/22/2013 Pathology Results Accession: [id number] pathology showed a high-grade follicular lymphoma with possibly foci of diffuse large B-cell lymphoma   01/04/2014 Bone Marrow Biopsy BM biopsy was negative with normal cytogenetics   01/05/2014 Imaging PET CT scan showed lymph nodes involvement of both sides of her diaphragm   01/06/2014 Surgery He has port placed   01/07/2014 Imaging ECHO showed preserved EF of 73%, mildly dilated atrium   01/12/2014 - 04/27/2014 Chemotherapy He is received 6 cycles of R CHOP chemotherapy   02/02/2014 Adverse Reaction Vincristine dose was reduced by 50% due to early signs of peripheral neuropathy   03/11/2014 Imaging Repeat PET scan showed near complete response to treatment   06/07/2014 Imaging PET scan showed complete response to treatment    INTERVAL HISTORY: Please see below for problem oriented charting. He returns for further follow-up. He had carpal tunnel surgery on the left hand recently and he wrapped bandage around his left hand. He started to notice some dry skin on his left palm with some keratotic skin lesions and also some abnormal new skin changes. He denies new lymphadenopathy. No change in appetite  or weight.  REVIEW OF SYSTEMS:   Constitutional: Denies fevers, chills or abnormal weight loss Eyes: Denies blurriness of vision Ears, nose, mouth, throat, and face: Denies mucositis or sore throat Respiratory: Denies cough, dyspnea or wheezes Cardiovascular: Denies palpitation, chest discomfort or lower extremity swelling Gastrointestinal:  Denies nausea, heartburn or change in bowel habits Lymphatics: Denies new lymphadenopathy or easy bruising Neurological:Denies numbness, tingling or new weaknesses Behavioral/Psych: Mood is stable, no new changes  All other systems were reviewed with the patient and are negative.  I have reviewed the past medical history, past surgical history, social history and family history with the patient and they are unchanged from previous note.  ALLERGIES:  has No Known Allergies.  MEDICATIONS:  Current Outpatient Prescriptions  Medication Sig Dispense Refill  . aspirin EC 81 MG tablet Take 1 tablet (81 mg total) by mouth daily.    . Atorvastatin Calcium (INVESTIGATIONAL ATORVASTATIN/PLACEBO) 40 MG tablet [facility] [phone] Take 1 tablet by mouth daily. Take 1 tablet daily with or without food. 180 tablet 0  . enalapril (VASOTEC) 20 MG tablet Take 1 tablet (20 mg total) by mouth at bedtime. (Patient taking differently: Take 20 mg by mouth every morning. ) 90 tablet 3  . HYDROcodone-acetaminophen (NORCO) 7.5-325 MG per tablet Take 1 tablet by mouth every 4 (four) hours as needed for moderate pain. 120 tablet 0   No current facility-administered medications for this visit.    PHYSICAL EXAMINATION: ECOG PERFORMANCE STATUS: 0 - Asymptomatic  Filed Vitals:   09/07/14 0821  BP: 135/82  Pulse:  73  Temp: 98.3 F (36.8 C)  Resp: 20   Filed Weights   09/07/14 0821  Weight: 277 lb 14.4 oz (126.055 kg)    GENERAL:alert, no distress and comfortable. He is morbidly obese SKIN:  Noted dry skin as well as some keratotic skin lesions on the palmar surface  both hands EYES: normal, Conjunctiva are pink and non-injected, sclera clear OROPHARYNX:no exudate, no erythema and lips, buccal mucosa, and tongue normal  NECK: supple, thyroid normal size, non-tender, without nodularity LYMPH:  no palpable lymphadenopathy in the cervical, axillary or inguinal LUNGS: clear to auscultation and percussion with normal breathing effort HEART: regular rate & rhythm and no murmurs and no lower extremity edema ABDOMEN:abdomen soft, non-tender and normal bowel sounds Musculoskeletal:no cyanosis of digits and no clubbing  NEURO: alert & oriented x 3 with fluent speech, no focal motor/sensory deficits  LABORATORY DATA:  I have reviewed the data as listed    Component Value Date/Time   NA 140 09/07/2014 0807   NA 137 01/04/2014 0907   K 4.5 09/07/2014 0807   K 4.1 01/04/2014 0907   CL 100 01/04/2014 0907   CO2 25 09/07/2014 0807   CO2 26 01/04/2014 0907   GLUCOSE 108 09/07/2014 0807   GLUCOSE 112* 01/04/2014 0907   BUN 13.6 09/07/2014 0807   BUN 15 01/04/2014 0907   CREATININE 1.0 09/07/2014 0807   CREATININE 0.92 01/04/2014 0907   CREATININE 0.98 12/09/2013 0716   CALCIUM 9.5 09/07/2014 0807   CALCIUM 9.7 01/04/2014 0907   PROT 6.5 09/07/2014 0807   PROT 7.0 01/04/2014 0907   ALBUMIN 3.7 09/07/2014 0807   ALBUMIN 3.6 01/04/2014 0907   AST 37* 09/07/2014 0807   AST 46* 01/04/2014 0907   ALT 48 09/07/2014 0807   ALT 70* 01/04/2014 0907   ALKPHOS 67 09/07/2014 0807   ALKPHOS 51 01/04/2014 0907   BILITOT 0.61 09/07/2014 0807   BILITOT 0.5 01/04/2014 0907   GFRNONAA >90 01/04/2014 0907   GFRAA >90 01/04/2014 0907    No results found for: SPEP, UPEP  Lab Results  Component Value Date   WBC 5.3 09/07/2014   NEUTROABS 3.7 09/07/2014   HGB 14.2 09/07/2014   HCT 41.7 09/07/2014   MCV 95.5 09/07/2014   PLT 187 09/07/2014      Chemistry      Component Value Date/Time   NA 140 09/07/2014 0807   NA 137 01/04/2014 0907   K 4.5 09/07/2014 0807    K 4.1 01/04/2014 0907   CL 100 01/04/2014 0907   CO2 25 09/07/2014 0807   CO2 26 01/04/2014 0907   BUN 13.6 09/07/2014 0807   BUN 15 01/04/2014 0907   CREATININE 1.0 09/07/2014 0807   CREATININE 0.92 01/04/2014 0907   CREATININE 0.98 12/09/2013 0716      Component Value Date/Time   CALCIUM 9.5 09/07/2014 0807   CALCIUM 9.7 01/04/2014 0907   ALKPHOS 67 09/07/2014 0807   ALKPHOS 51 01/04/2014 0907   AST 37* 09/07/2014 0807   AST 46* 01/04/2014 0907   ALT 48 09/07/2014 0807   ALT 70* 01/04/2014 0907   BILITOT 0.61 09/07/2014 0807   BILITOT 0.5 01/04/2014 0907      ASSESSMENT & PLAN:  Diffuse large B cell lymphoma Clinically, he had no signs of disease. Recent PET CT scan showed complete remission. The patient will continue to be enrolled in clinical trial. I will see him back in 3 months with history, physical examination and blood work. I  plan to defer imaging study to the future only as needed if there are clinical signs suspicious for disease recurrence. I do not recommend routine surveillance imaging for him.  Skin rash He has very mild skin rash associated with dry skin and mild dermatitis. I recommend skin moisturizer.  He also has a very mild psoriasis on his hand which can be observed The nails changes should improve over time.   No orders of the defined types were placed in this encounter.   All questions were answered. The patient knows to call the clinic with any problems, questions or concerns. No barriers to learning was detected. I spent 15 minutes counseling the patient face to face. The total time spent in the appointment was 20 minutes and more than 50% was on counseling and review of test results     [facility], Neesha Langton, MD 09/07/2014 10:16 AM

## 2014-09-09 ENCOUNTER — Ambulatory Visit: Payer: BLUE CROSS/BLUE SHIELD | Admitting: Internal Medicine

## 2014-11-01 ENCOUNTER — Other Ambulatory Visit: Payer: Self-pay | Admitting: Radiology

## 2014-11-04 ENCOUNTER — Other Ambulatory Visit (HOSPITAL_COMMUNITY): Payer: BLUE CROSS/BLUE SHIELD

## 2014-11-09 NOTE — Patient Instructions (Signed)
Jared Bentley  11/09/2014     @PREFPERIOPPHARMACY @   Your procedure is scheduled on 11/16/2014.  Report to [facility] at 7:25 A.M.  Call this number if you have problems the morning of surgery:  [phone]   Remember:  Do not eat food or drink liquids after midnight.  Take these medicines the morning of surgery with A SIP OF WATER Enalapril   Do not wear jewelry, make-up or nail polish.  Do not wear lotions, powders, or perfumes.  You may wear deodorant.  Do not shave 48 hours prior to surgery.  Men may shave face and neck.  Do not bring valuables to the hospital.  [facility] is not responsible for any belongings or valuables.  Contacts, dentures or bridgework may not be worn into surgery.  Leave your suitcase in the car.  After surgery it may be brought to your room.  For patients admitted to the hospital, discharge time will be determined by your treatment team.  Patients discharged the day of surgery will not be allowed to drive home.    Please read over the following fact sheets that you were given. Surgical Site Infection Prevention and Anesthesia Post-op Instructions     PATIENT INSTRUCTIONS POST-ANESTHESIA  IMMEDIATELY FOLLOWING SURGERY:  Do not drive or operate machinery for the first twenty four hours after surgery.  Do not make any important decisions for twenty four hours after surgery or while taking narcotic pain medications or sedatives.  If you develop intractable nausea and vomiting or a severe headache please notify your doctor immediately.  FOLLOW-UP:  Please make an appointment with your surgeon as instructed. You do not need to follow up with anesthesia unless specifically instructed to do so.  WOUND CARE INSTRUCTIONS (if applicable):  Keep a dry clean dressing on the anesthesia/puncture wound site if there is drainage.  Once the wound has quit draining you may leave it open to air.  Generally you should leave the bandage intact for twenty four hours  unless there is drainage.  If the epidural site drains for more than 36-48 hours please call the anesthesia department.  QUESTIONS?:  Please feel free to call your physician or the hospital operator if you have any questions, and they will be happy to assist you.      Carpal Tunnel Release Carpal tunnel release is done to relieve the pressure on the nerves and tendons on the bottom side of your wrist.  LET YOUR CAREGIVER KNOW ABOUT:   Allergies to food or medicine.  Medicines taken, including vitamins, herbs, eyedrops, over-the-counter medicines, and creams.  Use of steroids (by mouth or creams).  Previous problems with anesthetics or numbing medicines.  History of bleeding problems or blood clots.  Previous surgery.  Other health problems, including diabetes and kidney problems.  Possibility of pregnancy, if this applies. RISKS AND COMPLICATIONS  Some problems that may happen after this procedure include:  Infection.  Damage to the nerves, arteries or tendons could occur. This would be very uncommon.  Bleeding. BEFORE THE PROCEDURE   This surgery may be done while you are asleep (general anesthetic) or may be done under a block where only your forearm and the surgical area is numb.  If the surgery is done under a block, the numbness will gradually wear off within several hours after surgery. HOME CARE INSTRUCTIONS   Have a responsible person with you for 24 hours.  Do not drive a car or use public transportation for 24  hours.  Only take over-the-counter or prescription medicines for pain, discomfort, or fever as directed by your caregiver. Take them as directed.  You may put ice on the palm side of the affected wrist.  Put ice in a plastic bag.  Place a towel between your skin and the bag.  Leave the ice on for 20 to 30 minutes, 4 times per day.  If you were given a splint to keep your wrist from bending, use it as directed. It is important to wear the splint at  night or as directed. Use the splint for as long as you have pain or numbness in your hand, arm, or wrist. This may take 1 to 2 months.  Keep your hand raised (elevated) above the level of your heart as much as possible. This keeps swelling down and helps with discomfort.  Change bandages (dressings) as directed.  Keep the wound clean and dry. SEEK MEDICAL CARE IF:   You develop pain not relieved with medications.  You develop numbness of your hand.  You develop bleeding from your surgical site.  You have an oral temperature above 102 F (38.9 C).  You develop redness or swelling of the surgical site.  You develop new, unexplained problems. SEEK IMMEDIATE MEDICAL CARE IF:   You develop a rash.  You have difficulty breathing.  You develop any reaction or side effects to medications given. Document Released: 04/14/2003 Document Revised: 04/16/2011 Document Reviewed: 11/28/2006 [facility] Patient Information 2015 [place], [place]. This information is not intended to replace advice given to you by your health care provider. Make sure you discuss any questions you have with your health care provider.

## 2014-11-11 ENCOUNTER — Encounter (HOSPITAL_COMMUNITY)
Admission: RE | Admit: 2014-11-11 | Discharge: 2014-11-11 | Disposition: A | Discharge status: Home / Self Care | Payer: BLUE CROSS/BLUE SHIELD | Source: Ambulatory Visit | Attending: Orthopaedic Surgery | Admitting: Orthopaedic Surgery

## 2014-11-11 ENCOUNTER — Encounter (HOSPITAL_COMMUNITY): Payer: Self-pay

## 2014-11-11 DIAGNOSIS — Z01818 Encounter for other preprocedural examination: Secondary | ICD-10-CM | POA: Diagnosis not present

## 2014-11-11 DIAGNOSIS — G5601 Carpal tunnel syndrome, right upper limb: Secondary | ICD-10-CM | POA: Diagnosis not present

## 2014-11-11 LAB — COMPREHENSIVE METABOLIC PANEL
ALT: 53 U/L (ref 17–63)
ANION GAP: 8 (ref 5–15)
AST: 38 U/L (ref 15–41)
Albumin: 4.1 g/dL (ref 3.5–5.0)
Alkaline Phosphatase: 64 U/L (ref 38–126)
BILIRUBIN TOTAL: 0.8 mg/dL (ref 0.3–1.2)
BUN: 19 mg/dL (ref 6–20)
CO2: 25 mmol/L (ref 22–32)
Calcium: 9 mg/dL (ref 8.9–10.3)
Chloride: 103 mmol/L (ref 101–111)
Creatinine, Ser: 0.98 mg/dL (ref 0.61–1.24)
GFR calc Af Amer: >60 mL/min (ref >60)
GFR calc non Af Amer: >60 mL/min (ref >60)
Glucose, Bld: 110 mg/dL — ABNORMAL HIGH (ref 65–99)
Potassium: 4.1 mmol/L (ref 3.5–5.1)
Sodium: 136 mmol/L (ref 135–145)
Total Protein: 6.7 g/dL (ref 6.5–8.1)

## 2014-11-11 LAB — CBC WITH DIFFERENTIAL/PLATELET
Basophils Absolute: 0 10*3/uL (ref 0.0–0.1)
Basophils Relative: 1 %
EOS ABS: 0.2 10*3/uL (ref 0.0–0.7)
Eosinophils Relative: 3 %
HEMATOCRIT: 43.1 % (ref 39.0–52.0)
Hemoglobin: 14.8 g/dL (ref 13.0–17.0)
Lymphocytes Relative: 16 %
Lymphs Abs: 1.1 10*3/uL (ref 0.7–4.0)
MCH: 33.9 pg (ref 26.0–34.0)
MCHC: 34.3 g/dL (ref 30.0–36.0)
MCV: 98.9 fL (ref 78.0–100.0)
Monocytes Absolute: 0.8 10*3/uL (ref 0.1–1.0)
Monocytes Relative: 12 %
NEUTROS PCT: 68 %
Neutro Abs: 4.4 10*3/uL (ref 1.7–7.7)
Platelets: 208 10*3/uL (ref 150–400)
RBC: 4.36 MIL/uL (ref 4.22–5.81)
RDW: 13.3 % (ref 11.5–15.5)
WBC: 6.5 10*3/uL (ref 4.0–10.5)

## 2014-11-11 LAB — URINALYSIS, ROUTINE W REFLEX MICROSCOPIC
BILIRUBIN URINE: NEGATIVE
Glucose, UA: NEGATIVE mg/dL
Hgb urine dipstick: NEGATIVE
KETONES UR: NEGATIVE mg/dL
Leukocytes, UA: NEGATIVE
NITRITE: NEGATIVE
PROTEIN: NEGATIVE mg/dL
Specific Gravity, Urine: 1.015 (ref 1.005–1.030)
UROBILINOGEN UA: 0.2 mg/dL (ref 0.0–1.0)
pH: 5.5 (ref 5.0–8.0)

## 2014-11-15 NOTE — H&P (Signed)
Jared Bentley is an 59 y.o. male.   Chief Complaint: Carpal tunnel syndrome right  HPI: He has had carpal tunnel syndrome both hands as shown by EMGs.  He has had release of the left hand for carpal tunnel syndrome several months ago and has done well.  He is now for right hand carpal tunnel release.  He understands the procedure and the risks and imponderables.  It is an out patient procedure.  Past Medical History  Diagnosis Date  . Hypertension   . Chronic back pain   . ED (erectile dysfunction)   . Glucose intolerance (impaired glucose tolerance)   . Anxiety   . Sleep apnea     severe OSA-uses a cpap  . Heavy alcohol consumption     6-12 beers daily  . Carpal tunnel syndrome   . Diffuse large B cell lymphoma ([place]) 12/30/2013    Past Surgical History  Procedure Laterality Date  . Vasectomy    . Colonoscopy w/ polypectomy    . Wisdom tooth extraction    . Lymph node biopsy Left 12/22/2013    Procedure: LEFT INGUINAL LYMPH NODE BIOPSY;  Surgeon: [name], MD;  Location: [place];  Service: General;  Laterality: Left;  . Portacath placement N/A 01/06/2014    Procedure: INSERTION PORT-A-CATH;  Surgeon: [name], MD;  Location: [facility] [place];  Service: General;  Laterality: N/A;  . Port-a-cath removal N/A 07/08/2014    Procedure: REMOVAL PORT-A-CATH;  Surgeon: [name], MD;  Location: [facility];  Service: General;  Laterality: N/A;  . Carpal tunnel release Left 07/29/2014    Procedure: CARPAL TUNNEL RELEASE;  Surgeon: Sanjuana Kava, MD;  Location: [facility];  Service: Orthopedics;  Laterality: Left;    Family History  Problem Relation Age of Onset  . Cancer Father     bone  . Cancer Mother     tongue ca   Social History:  reports that he quit smoking about 10 years ago. He has never used smokeless tobacco. He reports that he drinks alcohol. He reports that he does not use illicit drugs.  Allergies: No Known Allergies  No prescriptions prior to  admission    No results found for this or any previous visit (from the past 48 hour(s)). No results found.  Review of Systems  Constitutional: Negative.   HENT: Negative.   Eyes: Negative.   Respiratory:       Sleep apnea  Gastrointestinal:       History of elevated liver enzymes, history of alcoholism  Musculoskeletal:       Pain right hand, numbness in median nerve distribution.  Prior carpal tunnel release on left with good results.  Skin: Negative.   Endo/Heme/Allergies:       History of large B cell lymphoma    There were no vitals taken for this visit. Physical Exam  Constitutional: He is oriented to person, place, and time. He appears well-developed and well-nourished.  HENT:  Head: Normocephalic and atraumatic.  Eyes: Conjunctivae and EOM are normal. Pupils are equal, round, and reactive to light.  Neck: Normal range of motion.  Cardiovascular: Normal rate, regular rhythm, normal heart sounds and intact distal pulses.   Respiratory: Effort normal and breath sounds normal.  GI: Soft.  Musculoskeletal: He exhibits tenderness (Positive Tinel and Phalan sign right wrist, decreased sensation median nerve distribution.).  Neurological: He is alert and oriented to person, place, and time. He has normal reflexes.  Skin: Skin is warm  and dry.  Psychiatric: He has a normal mood and affect. His behavior is normal. Judgment and thought content normal.     Assessment/Plan Carpal tunnel syndrome right Post left carpal tunnel release Sleep apnea History of obstructive sleep apnea History of chronic alcoholism and elevated enzymes of liver History of Large B Cell Lymphoma  Madicyn Mesina 11/15/2014, 5:31 PM

## 2014-11-16 ENCOUNTER — Ambulatory Visit (HOSPITAL_COMMUNITY): Payer: BLUE CROSS/BLUE SHIELD | Admitting: Anesthesiology

## 2014-11-16 ENCOUNTER — Encounter (HOSPITAL_COMMUNITY): Payer: Self-pay | Admitting: *Deleted

## 2014-11-16 ENCOUNTER — Ambulatory Visit (HOSPITAL_COMMUNITY)
Admission: RE | Admit: 2014-11-16 | Discharge: 2014-11-16 | Disposition: A | Discharge status: Home / Self Care | Payer: BLUE CROSS/BLUE SHIELD | Source: Ambulatory Visit | Attending: Orthopaedic Surgery | Admitting: Orthopaedic Surgery

## 2014-11-16 ENCOUNTER — Encounter (HOSPITAL_COMMUNITY)
Admission: RE | Disposition: A | Discharge status: Home / Self Care | Payer: Self-pay | Source: Ambulatory Visit | Attending: Orthopaedic Surgery

## 2014-11-16 DIAGNOSIS — G4730 Sleep apnea, unspecified: Secondary | ICD-10-CM | POA: Diagnosis not present

## 2014-11-16 DIAGNOSIS — I10 Essential (primary) hypertension: Secondary | ICD-10-CM | POA: Diagnosis not present

## 2014-11-16 DIAGNOSIS — Z8572 Personal history of non-Hodgkin lymphomas: Secondary | ICD-10-CM | POA: Diagnosis not present

## 2014-11-16 DIAGNOSIS — Z87891 Personal history of nicotine dependence: Secondary | ICD-10-CM | POA: Diagnosis not present

## 2014-11-16 DIAGNOSIS — R7302 Impaired glucose tolerance (oral): Secondary | ICD-10-CM | POA: Insufficient documentation

## 2014-11-16 DIAGNOSIS — G5601 Carpal tunnel syndrome, right upper limb: Secondary | ICD-10-CM | POA: Insufficient documentation

## 2014-11-16 DIAGNOSIS — F419 Anxiety disorder, unspecified: Secondary | ICD-10-CM | POA: Insufficient documentation

## 2014-11-16 HISTORY — PX: CARPAL TUNNEL RELEASE: SHX101

## 2014-11-16 SURGERY — CARPAL TUNNEL RELEASE
Anesthesia: Regional | Site: Wrist | Laterality: Right

## 2014-11-16 MED ORDER — LACTATED RINGERS IV SOLN
INTRAVENOUS | Status: DC
  Administered 2014-11-16: 08:00:00 via INTRAVENOUS

## 2014-11-16 MED ORDER — FENTANYL CITRATE (PF) 100 MCG/2ML IJ SOLN
INTRAMUSCULAR | Status: AC
  Filled 2014-11-16: qty 2

## 2014-11-16 MED ORDER — FENTANYL CITRATE (PF) 100 MCG/2ML IJ SOLN
25.0000 ug | INTRAMUSCULAR | Status: DC | PRN
  Administered 2014-11-16: 50 ug via INTRAVENOUS
  Administered 2014-11-16 (×2): 25 ug via INTRAVENOUS
  Filled 2014-11-16: qty 2

## 2014-11-16 MED ORDER — MIDAZOLAM HCL 2 MG/2ML IJ SOLN
INTRAMUSCULAR | Status: AC
  Filled 2014-11-16: qty 2

## 2014-11-16 MED ORDER — SODIUM CHLORIDE 0.9 % IJ SOLN
INTRAMUSCULAR | Status: DC | PRN
  Administered 2014-11-16: 2 mL via INTRAVENOUS

## 2014-11-16 MED ORDER — ONDANSETRON HCL 4 MG/2ML IJ SOLN: 4.0000 mg | Freq: Once | INTRAMUSCULAR | Status: DC | PRN

## 2014-11-16 MED ORDER — LIDOCAINE HCL (PF) 0.5 % IJ SOLN
INTRAMUSCULAR | Status: AC
  Filled 2014-11-16: qty 50

## 2014-11-16 MED ORDER — PROPOFOL 10 MG/ML IV BOLUS
INTRAVENOUS | Status: AC
  Filled 2014-11-16: qty 20

## 2014-11-16 MED ORDER — MIDAZOLAM HCL 2 MG/2ML IJ SOLN
1.0000 mg | INTRAMUSCULAR | Status: DC | PRN
  Administered 2014-11-16 (×3): 2 mg via INTRAVENOUS
  Filled 2014-11-16 (×2): qty 2

## 2014-11-16 MED ORDER — PROPOFOL 500 MG/50ML IV EMUL
INTRAVENOUS | Status: DC | PRN
  Administered 2014-11-16: 50 ug via INTRAVENOUS
  Administered 2014-11-16: 100 ug via INTRAVENOUS
  Administered 2014-11-16: 75 ug via INTRAVENOUS

## 2014-11-16 MED ORDER — FENTANYL CITRATE (PF) 100 MCG/2ML IJ SOLN
INTRAMUSCULAR | Status: DC | PRN
  Administered 2014-11-16: 50 ug via INTRAVENOUS

## 2014-11-16 MED ORDER — LIDOCAINE HCL (PF) 0.5 % IJ SOLN
INTRAMUSCULAR | Status: DC | PRN
  Administered 2014-11-16: 250 mg via INTRAVENOUS

## 2014-11-16 MED ORDER — FENTANYL CITRATE (PF) 100 MCG/2ML IJ SOLN
25.0000 ug | INTRAMUSCULAR | Status: AC
  Administered 2014-11-16 (×2): 25 ug via INTRAVENOUS

## 2014-11-16 MED ORDER — CHLORHEXIDINE GLUCONATE 4 % EX LIQD: 60.0000 mL | Freq: Once | CUTANEOUS | Status: DC

## 2014-11-16 MED ORDER — HYDROCODONE-ACETAMINOPHEN 7.5-325 MG PO TABS: 1.0000 | ORAL_TABLET | ORAL | Status: DC | PRN

## 2014-11-16 MED ORDER — LIDOCAINE HCL 1 % IJ SOLN
INTRAMUSCULAR | Status: DC | PRN
  Administered 2014-11-16: 15 mg via INTRADERMAL

## 2014-11-16 MED ORDER — MIDAZOLAM HCL 2 MG/2ML IJ SOLN
INTRAMUSCULAR | Status: AC
Start: 2014-11-16 — End: 2014-11-16
  Filled 2014-11-16: qty 4

## 2014-11-16 MED ORDER — PROPOFOL 10 MG/ML IV BOLUS
INTRAVENOUS | Status: DC | PRN
  Administered 2014-11-16: 15 mg via INTRAVENOUS

## 2014-11-16 MED ORDER — SODIUM CHLORIDE 0.9 % IR SOLN
Status: DC | PRN
  Administered 2014-11-16: 1000 mL

## 2014-11-16 MED ORDER — SODIUM CHLORIDE 0.9 % IJ SOLN
INTRAMUSCULAR | Status: AC
  Filled 2014-11-16: qty 10

## 2014-11-16 SURGICAL SUPPLY — 42 items
BAG HAMPER (MISCELLANEOUS) ×3 IMPLANT
BANDAGE ELASTIC 3 VELCRO NS (GAUZE/BANDAGES/DRESSINGS) ×3 IMPLANT
BANDAGE ESMARK 4X12 BL STRL LF (DISPOSABLE) ×1 IMPLANT
BLADE 15 SAFETY STRL DISP (BLADE) ×3 IMPLANT
BNDG CMPR 12X4 ELC STRL LF (DISPOSABLE) ×1
BNDG ESMARK 4X12 BLUE STRL LF (DISPOSABLE) ×3
CLOTH BEACON ORANGE TIMEOUT ST (SAFETY) ×3 IMPLANT
COVER LIGHT HANDLE STERIS (MISCELLANEOUS) ×6 IMPLANT
CUFF TOURNIQUET SINGLE 18IN (TOURNIQUET CUFF) ×3 IMPLANT
DRSG XEROFORM 1X8 (GAUZE/BANDAGES/DRESSINGS) ×2 IMPLANT
DURAPREP 26ML APPLICATOR (WOUND CARE) ×3 IMPLANT
ELECT NDL TIP 2.8 STRL (NEEDLE) IMPLANT
ELECT NEEDLE TIP 2.8 STRL (NEEDLE) ×3 IMPLANT
ELECT REM PT RETURN 9FT ADLT (ELECTROSURGICAL) ×3
ELECTRODE REM PT RTRN 9FT ADLT (ELECTROSURGICAL) ×1 IMPLANT
FORMALIN 10 PREFIL 120ML (MISCELLANEOUS) ×3 IMPLANT
GAUZE SPONGE 4X4 12PLY STRL (GAUZE/BANDAGES/DRESSINGS) ×3 IMPLANT
GLOVE BIO SURGEON STRL SZ8 (GLOVE) ×3 IMPLANT
GLOVE BIO SURGEON STRL SZ8.5 (GLOVE) ×3 IMPLANT
GLOVE BIOGEL PI IND STRL 7.0 (GLOVE) IMPLANT
GLOVE BIOGEL PI INDICATOR 7.0 (GLOVE) ×2
GLOVE ECLIPSE 6.5 STRL STRAW (GLOVE) ×2 IMPLANT
GLOVE EXAM NITRILE MD LF STRL (GLOVE) ×2 IMPLANT
GOWN STRL REUS W/TWL LRG LVL3 (GOWN DISPOSABLE) ×6 IMPLANT
GOWN STRL REUS W/TWL XL LVL3 (GOWN DISPOSABLE) ×3 IMPLANT
KIT ROOM TURNOVER APOR (KITS) ×3 IMPLANT
NDL HYPO 18GX1.5 BLUNT FILL (NEEDLE) ×1 IMPLANT
NDL HYPO 27GX1-1/4 (NEEDLE) ×1 IMPLANT
NEEDLE HYPO 18GX1.5 BLUNT FILL (NEEDLE) ×3 IMPLANT
NEEDLE HYPO 27GX1-1/4 (NEEDLE) ×3 IMPLANT
NS IRRIG 1000ML POUR BTL (IV SOLUTION) ×3 IMPLANT
PACK BASIC LIMB (CUSTOM PROCEDURE TRAY) ×3 IMPLANT
PAD ARMBOARD 7.5X6 YLW CONV (MISCELLANEOUS) ×3 IMPLANT
PAD CAST 3X4 CTTN HI CHSV (CAST SUPPLIES) ×1 IMPLANT
PADDING CAST COTTON 3X4 STRL (CAST SUPPLIES) ×3
PADDING WEBRIL 3 STERILE (GAUZE/BANDAGES/DRESSINGS) ×2 IMPLANT
SET BASIN LINEN APH (SET/KITS/TRAYS/PACK) ×3 IMPLANT
SPONGE GAUZE 4X4 12PLY (GAUZE/BANDAGES/DRESSINGS) ×1 IMPLANT
SUT ETHILON 3 0 FSL (SUTURE) ×3 IMPLANT
SYR 3ML LL SCALE MARK (SYRINGE) ×3 IMPLANT
TOWEL OR 17X26 4PK STRL BLUE (TOWEL DISPOSABLE) ×1 IMPLANT
VESSEL LOOPS MAXI RED (MISCELLANEOUS) ×3 IMPLANT

## 2014-11-16 NOTE — Anesthesia Procedure Notes (Signed)
Anesthesia Regional Block:  Bier block (IV Regional)  Pre-Anesthetic Checklist: ,, timeout performed, Correct Patient, Correct Site, Correct Laterality, Correct Procedure,, site marked, surgical consent,, at surgeon's request Needles:  Injection technique: Single-shot  Needle Type: Other      Needle Gauge: 22 and 22 G    Additional Needles: Bier block (IV Regional)  Nerve Stimulator or Paresthesia:   Additional Responses:  Pulse checked post tourniquet inflation. IV NSL discontinued post injection. Narrative:  Start time: 11/16/2014 9:24 AM  Performed by: Personally

## 2014-11-16 NOTE — Progress Notes (Signed)
The History and Physical is unchanged. I have examined the patient. The patient is medically able to have surgery on the right hand/wrist for carpal tunnel . Sanjuana Kava

## 2014-11-16 NOTE — Anesthesia Postprocedure Evaluation (Signed)
  Anesthesia Post-op Note  Patient: Jared Bentley  Procedure(s) Performed: Procedure(s): CARPAL TUNNEL RELEASE (Right)  Patient Location: PACU  Anesthesia Type:Bier block  Level of Consciousness: awake, alert , oriented and patient cooperative  Airway and Oxygen Therapy: Patient Spontanous Breathing  Post-op Pain: 2 /10, mild  Post-op Assessment: Post-op Vital signs reviewed, Patient's Cardiovascular Status Stable, Respiratory Function Stable, Patent Airway, Adequate PO intake and Pain level controlled              Post-op Vital Signs: Reviewed and stable  Last Vitals:  Filed Vitals:   11/16/14 0910  BP: 126/76  Pulse:   Temp:   Resp: 16    Complications: No apparent anesthesia complications

## 2014-11-16 NOTE — Anesthesia Preprocedure Evaluation (Signed)
Anesthesia Evaluation  Patient identified by MRN, date of birth, ID band Patient awake    Reviewed: Allergy & Precautions, NPO status , Patient's Chart, lab work & pertinent test results  Airway Mallampati: II  TM Distance: >3 FB Neck ROM: Full    Dental  (+) Teeth Intact   Pulmonary sleep apnea , former smoker,    Pulmonary exam normal breath sounds clear to auscultation       Cardiovascular Exercise Tolerance: Good hypertension, Pt. on medications Normal cardiovascular exam Rhythm:Regular Rate:Normal  Walks a mile per day.   Neuro/Psych PSYCHIATRIC DISORDERS Anxiety  Neuromuscular disease    GI/Hepatic (+)     substance abuse  alcohol use,   Endo/Other  Morbid obesity  Renal/GU      Musculoskeletal   Abdominal (+) + obese,   Peds  Hematology  (+) anemia ,   Anesthesia Other Findings   Reproductive/Obstetrics                             Anesthesia Physical Anesthesia Plan  ASA: III  Anesthesia Plan: Bier Block   Post-op Pain Management:    Induction: Intravenous  Airway Management Planned: Simple Face Mask  Additional Equipment:   Intra-op Plan:   Post-operative Plan:   Informed Consent: I have reviewed the patients History and Physical, chart, labs and discussed the procedure including the risks, benefits and alternatives for the proposed anesthesia with the patient or authorized representative who has indicated his/her understanding and acceptance.     Plan Discussed with:   Anesthesia Plan Comments:         Anesthesia Quick Evaluation

## 2014-11-16 NOTE — Brief Op Note (Signed)
11/16/2014  9:54 AM  PATIENT:  Jared Bentley  59 y.o. male  PRE-OPERATIVE DIAGNOSIS:  right carpal tunnel  POST-OPERATIVE DIAGNOSIS:  right carpal tunnel  PROCEDURE:  Procedure(s): CARPAL TUNNEL RELEASE (Right)  SURGEON:  Surgeon(s) and Role:    * Sanjuana Kava, MD - Primary  PHYSICIAN ASSISTANT:   ASSISTANTS: none   ANESTHESIA:   regional  EBL:  Total I/O In: 600 [I.V.:600] Out: -   BLOOD ADMINISTERED:none  DRAINS: none   LOCAL MEDICATIONS USED:  NONE  SPECIMEN:  Source of Specimen:  right volar carpal ligament  DISPOSITION OF SPECIMEN:  PATHOLOGY  COUNTS:  YES  TOURNIQUET:  * Missing tourniquet times found for documented tourniquets in log:  [id number] *  DICTATION: .Other Dictation: Dictation Number [id number]  PLAN OF CARE: Discharge to home after PACU  PATIENT DISPOSITION:  PACU - hemodynamically stable.   Delay start of Pharmacological VTE agent (>24hrs) due to surgical blood loss or risk of bleeding: not applicable

## 2014-11-16 NOTE — Discharge Instructions (Signed)
Elevate right hand.  Keep hand dry.  Move fingers often.  Do not remove dressing.  See Dr. Luna Glasgow in his office in one week.  Call his office at [phone] if any problem or call the hospital at [phone] if after hours.

## 2014-11-16 NOTE — Transfer of Care (Signed)
Immediate Anesthesia Transfer of Care Note  Patient: Jared Bentley  Procedure(s) Performed: Procedure(s): CARPAL TUNNEL RELEASE (Right)  Patient Location: PACU  Anesthesia Type:MAC  Level of Consciousness: awake, alert , oriented and patient cooperative  Airway & Oxygen Therapy: Patient Spontanous Breathing and Patient connected to face mask oxygen  Post-op Assessment: Report given to RN, Post -op Vital signs reviewed and stable and Patient moving all extremities  Post vital signs: Reviewed and stable  Last Vitals:  Filed Vitals:   11/16/14 0910  BP: 126/76  Pulse:   Temp:   Resp: 16    Complications: No apparent anesthesia complications

## 2014-11-17 NOTE — Op Note (Signed)
[name], [name]                  ACCOUNT NO.:  [id number]  MEDICAL RECORD NO.:  [id number]  LOCATION:  [facility]                          FACILITY:  [facility]  PHYSICIAN:  J. Sanjuana Kava, M.D. DATE OF BIRTH:  1955/04/01  DATE OF PROCEDURE:  11/16/2014 DATE OF DISCHARGE:  11/16/2014                              OPERATIVE REPORT   PREOPERATIVE DIAGNOSIS:  Carpal tunnel syndrome, right.  POSTOPERATIVE DIAGNOSIS:  Carpal tunnel syndrome, right.  PROCEDURE:  Release of volar carpal ligament, saline neurolysis, epineurotomy, right median nerve.  ANESTHESIA:  Regional Bier block.  Please refer anesthesia record for tourniquet time.  No drains.  SURGEON:  J. Sanjuana Kava, M.D.  INDICATIONS:  The patient has had bilateral carpal tunnel syndrome. Several months ago, we did a left carpal tunnel release and now he is scheduled for right carpal tunnel release.  He has not improved with conservative treatment, medicines, or splints.  He had positive EMG showing bilateral carpal tunnel syndrome.  The patient knows the risk and imponderables of the procedure.  DESCRIPTION OF PROCEDURE:  The patient was seen in the holding area and the right hand was identified as the correct surgical site verified.  A mark was placed on the right palm.  He was brought to the operating room, placed supine on the operating room table.  Hand table was attached.  He was given Bier block anesthesia.  He was then prepped and draped in usual fashion.  I had a time-out identifying the patient as [name] and I am doing his right hand for carpal tunnel release.  All instrumentation properly positioned and working.  OR team knew each other.  Outline for incision was made with careful dissection, the median nerve was identified proximally.  A vessel loop placed around the nerve.  A groove director was then placed within the carpal tunnel space.  The volar carpal ligament was incised.  The nerve was compressed.   Saline neurolysis epineurotomy carried out.  Retinaculum cut proximally. Specimen of the volar carpal ligament was sent to Pathology.  Nerve was suspected, no apparent injury. The wound was then reapproximated using 3-0 nylon interrupted vertical mattress manner.  Sterile dressing applied, bulky dressing applied, and sheet cotton applied, sheet cotton cut dorsally to fit.  ACE bandage applied loosely.  The patient tolerated the procedure well, will go to recovery in good condition.          ______________________________ Lenna Sciara. Sanjuana Kava, M.D.     JWK/MEDQ  D:  11/16/2014  T:  11/17/2014  Job:  [id number]

## 2014-11-18 ENCOUNTER — Encounter (HOSPITAL_COMMUNITY): Payer: Self-pay | Admitting: Orthopaedic Surgery

## 2014-12-27 ENCOUNTER — Other Ambulatory Visit: Payer: Self-pay | Admitting: Hematology and Oncology

## 2014-12-27 ENCOUNTER — Encounter: Payer: Self-pay | Admitting: *Deleted

## 2014-12-27 DIAGNOSIS — C8330 Diffuse large B-cell lymphoma, unspecified site: Secondary | ICD-10-CM

## 2014-12-28 ENCOUNTER — Other Ambulatory Visit: Payer: Self-pay | Admitting: *Deleted

## 2014-12-28 ENCOUNTER — Encounter: Payer: Self-pay | Admitting: Hematology and Oncology

## 2014-12-28 ENCOUNTER — Telehealth: Payer: Self-pay | Admitting: Hematology and Oncology

## 2014-12-28 ENCOUNTER — Other Ambulatory Visit (HOSPITAL_BASED_OUTPATIENT_CLINIC_OR_DEPARTMENT_OTHER): Payer: BLUE CROSS/BLUE SHIELD

## 2014-12-28 ENCOUNTER — Ambulatory Visit (HOSPITAL_BASED_OUTPATIENT_CLINIC_OR_DEPARTMENT_OTHER): Payer: BLUE CROSS/BLUE SHIELD | Admitting: Hematology and Oncology

## 2014-12-28 VITALS — BP 115/70 | HR 77 | Temp 97.6°F | Resp 18 | Ht 73.0 in | Wt 283.7 lb

## 2014-12-28 DIAGNOSIS — Z8572 Personal history of non-Hodgkin lymphomas: Secondary | ICD-10-CM | POA: Diagnosis not present

## 2014-12-28 DIAGNOSIS — C8330 Diffuse large B-cell lymphoma, unspecified site: Secondary | ICD-10-CM | POA: Diagnosis not present

## 2014-12-28 DIAGNOSIS — Z299 Encounter for prophylactic measures, unspecified: Secondary | ICD-10-CM

## 2014-12-28 DIAGNOSIS — C8338 Diffuse large B-cell lymphoma, lymph nodes of multiple sites: Secondary | ICD-10-CM

## 2014-12-28 LAB — COMPREHENSIVE METABOLIC PANEL (CC13)
ALBUMIN: 3.9 g/dL (ref 3.5–5.0)
ALK PHOS: 76 U/L (ref 40–150)
ALT: 42 U/L (ref 0–55)
AST: 33 U/L (ref 5–34)
Anion Gap: 10 mEq/L (ref 3–11)
BILIRUBIN TOTAL: 0.78 mg/dL (ref 0.20–1.20)
BUN: 14 mg/dL (ref 7.0–26.0)
CO2: 25 mEq/L (ref 22–29)
CREATININE: 1.1 mg/dL (ref 0.7–1.3)
Calcium: 10 mg/dL (ref 8.4–10.4)
Chloride: 104 mEq/L (ref 98–109)
EGFR: 76 mL/min/{1.73_m2} — ABNORMAL LOW (ref >90)
GLUCOSE: 111 mg/dL (ref 70–140)
Potassium: 4.3 mEq/L (ref 3.5–5.1)
SODIUM: 138 meq/L (ref 136–145)
TOTAL PROTEIN: 6.8 g/dL (ref 6.4–8.3)

## 2014-12-28 LAB — CBC WITH DIFFERENTIAL/PLATELET
BASO%: 0.5 % (ref 0.0–2.0)
Basophils Absolute: 0 10*3/uL (ref 0.0–0.1)
EOS ABS: 0.2 10*3/uL (ref 0.0–0.5)
EOS%: 2.9 % (ref 0.0–7.0)
HCT: 43.7 % (ref 38.4–49.9)
HEMOGLOBIN: 14.8 g/dL (ref 13.0–17.1)
LYMPH%: 16.8 % (ref 14.0–49.0)
MCH: 33.2 pg (ref 27.2–33.4)
MCHC: 33.8 g/dL (ref 32.0–36.0)
MCV: 98.2 fL — AB (ref 79.3–98.0)
MONO#: 0.8 10*3/uL (ref 0.1–0.9)
MONO%: 13 % (ref 0.0–14.0)
NEUT%: 66.8 % (ref 39.0–75.0)
NEUTROS ABS: 3.9 10*3/uL (ref 1.5–6.5)
Platelets: 217 10*3/uL (ref 140–400)
RBC: 4.45 10*6/uL (ref 4.20–5.82)
RDW: 13.2 % (ref 11.0–14.6)
WBC: 5.9 10*3/uL (ref 4.0–10.3)
lymph#: 1 10*3/uL (ref 0.9–3.3)

## 2014-12-28 LAB — LACTATE DEHYDROGENASE (CC13): LDH: 191 U/L (ref 125–245)

## 2014-12-28 MED ORDER — INV-ATORVASTATIN/PLACEBO 40 MG TABS WAKE FOREST WF 98213: 1.0000 | ORAL_TABLET | Freq: Every day | ORAL | Status: DC

## 2014-12-28 NOTE — Assessment & Plan Note (Signed)
He is up-to-date with vaccination. I recommend he decrease alcohol intake. He had to stop aspirin therapy due to risk of nosebleed. I also recommend vitamin D supplements.

## 2014-12-28 NOTE — Assessment & Plan Note (Signed)
Clinically, he had no signs of disease. Recent PET CT scan showed complete remission. The patient will continue to be enrolled in clinical trial. I will see him back in 4 months with history, physical examination and blood work. I plan to defer imaging study to the future only as needed if there are clinical signs suspicious for disease recurrence. I do not recommend routine surveillance imaging for him.

## 2014-12-28 NOTE — Progress Notes (Signed)
[facility] OFFICE PROGRESS NOTE  Patient Care Team: Mikey Kirschner, MD as PCP - General (Family Medicine) Fay Records, MD as Consulting Physician (Cardiology)  SUMMARY OF ONCOLOGIC HISTORY: Oncology History   Diffuse large B cell lymphoma   Staging form: Lymphoid Neoplasms, AJCC 6th Edition     Clinical stage from 12/30/2013: Stage III - Signed by Heath Lark, MD on 01/08/2014     Pathologic: No stage assigned - Unsigned       History of B-cell lymphoma   12/14/2013 Imaging CT scan of the abdomen and pelvis showed bulky left iliac chain and left inguinal adenopathy, highly worrisome for lymphoma.   12/22/2013 Surgery He underwent excisional lymph node biopsy of the left inguinal region that confirm lymphoma diagnosis. No   12/22/2013 Pathology Results Accession: [id number] pathology showed a high-grade follicular lymphoma with possibly foci of diffuse large B-cell lymphoma   01/04/2014 Bone Marrow Biopsy BM biopsy was negative with normal cytogenetics   01/05/2014 Imaging PET CT scan showed lymph nodes involvement of both sides of her diaphragm   01/06/2014 Surgery He has port placed   01/07/2014 Imaging ECHO showed preserved EF of 73%, mildly dilated atrium   01/12/2014 - 04/27/2014 Chemotherapy He is received 6 cycles of R CHOP chemotherapy   02/02/2014 Adverse Reaction Vincristine dose was reduced by 50% due to early signs of peripheral neuropathy   03/11/2014 Imaging Repeat PET scan showed near complete response to treatment   06/07/2014 Imaging PET scan showed complete response to treatment    INTERVAL HISTORY: Please see below for problem oriented charting.  he returns for further follow-up. He feels well. Peripheral neuropathy has resolved. He had carpal tunnel surgery anything the neuropathy was due to that. Previously, he was taking aspirin but had to stop due to nosebleeds. He denies recent infection. He continues to drink alcohol on a regular basis  REVIEW OF  SYSTEMS:   Constitutional: Denies fevers, chills or abnormal weight loss Eyes: Denies blurriness of vision Ears, nose, mouth, throat, and face: Denies mucositis or sore throat Respiratory: Denies cough, dyspnea or wheezes Cardiovascular: Denies palpitation, chest discomfort or lower extremity swelling Gastrointestinal:  Denies nausea, heartburn or change in bowel habits Skin: Denies abnormal skin rashes Lymphatics: Denies new lymphadenopathy or easy bruising Neurological:Denies numbness, tingling or new weaknesses Behavioral/Psych: Mood is stable, no new changes  All other systems were reviewed with the patient and are negative.  I have reviewed the past medical history, past surgical history, social history and family history with the patient and they are unchanged from previous note.  ALLERGIES:  has No Known Allergies.  MEDICATIONS:  Current Outpatient Prescriptions  Medication Sig Dispense Refill  . Atorvastatin Calcium (INVESTIGATIONAL ATORVASTATIN/PLACEBO) 40 MG tablet [facility] [id number] Take 1 tablet by mouth daily. Take 1 tablet daily with or without food. 180 tablet 0  . enalapril (VASOTEC) 20 MG tablet Take 1 tablet (20 mg total) by mouth at bedtime. (Patient taking differently: Take 20 mg by mouth every morning. ) 90 tablet 3  . ENSTILAR 0.005-0.064 % FOAM      No current facility-administered medications for this visit.    PHYSICAL EXAMINATION: ECOG PERFORMANCE STATUS: 0 - Asymptomatic  Filed Vitals:   12/28/14 0830  BP: 115/70  Pulse: 77  Temp: 97.6 F (36.4 C)  Resp: 18   Filed Weights   12/28/14 0830  Weight: 283 lb 11.2 oz (128.685 kg)    GENERAL:alert, no distress and comfortable SKIN: skin  color, texture, turgor are normal, no rashes or significant lesions EYES: normal, Conjunctiva are pink and non-injected, sclera clear OROPHARYNX:no exudate, no erythema and lips, buccal mucosa, and tongue normal  NECK: supple, thyroid normal size, non-tender,  without nodularity LYMPH:  no palpable lymphadenopathy in the cervical, axillary or inguinal LUNGS: clear to auscultation and percussion with normal breathing effort HEART: regular rate & rhythm and no murmurs and no lower extremity edema ABDOMEN:abdomen soft, non-tender and normal bowel sounds Musculoskeletal:no cyanosis of digits and no clubbing  NEURO: alert & oriented x 3 with fluent speech, no focal motor/sensory deficits  LABORATORY DATA:  I have reviewed the data as listed    Component Value Date/Time   NA 138 12/28/2014 0813   NA 136 11/11/2014 0931   K 4.3 12/28/2014 0813   K 4.1 11/11/2014 0931   CL 103 11/11/2014 0931   CO2 25 12/28/2014 0813   CO2 25 11/11/2014 0931   GLUCOSE 111 12/28/2014 0813   GLUCOSE 110* 11/11/2014 0931   BUN 14.0 12/28/2014 0813   BUN 19 11/11/2014 0931   CREATININE 1.1 12/28/2014 0813   CREATININE 0.98 11/11/2014 0931   CREATININE 0.98 12/09/2013 0716   CALCIUM 10.0 12/28/2014 0813   CALCIUM 9.0 11/11/2014 0931   PROT 6.8 12/28/2014 0813   PROT 6.7 11/11/2014 0931   ALBUMIN 3.9 12/28/2014 0813   ALBUMIN 4.1 11/11/2014 0931   AST 33 12/28/2014 0813   AST 38 11/11/2014 0931   ALT 42 12/28/2014 0813   ALT 53 11/11/2014 0931   ALKPHOS 76 12/28/2014 0813   ALKPHOS 64 11/11/2014 0931   BILITOT 0.78 12/28/2014 0813   BILITOT 0.8 11/11/2014 0931   GFRNONAA >60 11/11/2014 0931   GFRAA >60 11/11/2014 0931    No results found for: SPEP, UPEP  Lab Results  Component Value Date   WBC 5.9 12/28/2014   NEUTROABS 3.9 12/28/2014   HGB 14.8 12/28/2014   HCT 43.7 12/28/2014   MCV 98.2* 12/28/2014   PLT 217 12/28/2014      Chemistry      Component Value Date/Time   NA 138 12/28/2014 0813   NA 136 11/11/2014 0931   K 4.3 12/28/2014 0813   K 4.1 11/11/2014 0931   CL 103 11/11/2014 0931   CO2 25 12/28/2014 0813   CO2 25 11/11/2014 0931   BUN 14.0 12/28/2014 0813   BUN 19 11/11/2014 0931   CREATININE 1.1 12/28/2014 0813   CREATININE  0.98 11/11/2014 0931   CREATININE 0.98 12/09/2013 0716      Component Value Date/Time   CALCIUM 10.0 12/28/2014 0813   CALCIUM 9.0 11/11/2014 0931   ALKPHOS 76 12/28/2014 0813   ALKPHOS 64 11/11/2014 0931   AST 33 12/28/2014 0813   AST 38 11/11/2014 0931   ALT 42 12/28/2014 0813   ALT 53 11/11/2014 0931   BILITOT 0.78 12/28/2014 0813   BILITOT 0.8 11/11/2014 0931      ASSESSMENT & PLAN:  History of B-cell lymphoma Clinically, he had no signs of disease. Recent PET CT scan showed complete remission. The patient will continue to be enrolled in clinical trial. I will see him back in 4 months with history, physical examination and blood work. I plan to defer imaging study to the future only as needed if there are clinical signs suspicious for disease recurrence. I do not recommend routine surveillance imaging for him.  Preventive measure  He is up-to-date with vaccination. I recommend he decrease alcohol intake. He had to  stop aspirin therapy due to risk of nosebleed. I also recommend vitamin D supplements.   No orders of the defined types were placed in this encounter.   All questions were answered. The patient knows to call the clinic with any problems, questions or concerns. No barriers to learning was detected. I spent 15 minutes counseling the patient face to face. The total time spent in the appointment was 20 minutes and more than 50% was on counseling and review of test results     [facility], [place], MD 12/28/2014 9:36 AM

## 2014-12-28 NOTE — Progress Notes (Signed)
12/28/2014  0900 12 month PREVENT visit The patient returns to clinic for follow-up.  He reports that he is doing well.  He denies having side effects of study drug, including muscle aches/pains, headache, abdominal pain, nausea, constipation, or URI.  He denies having any difficulties taking study drug or completing the medication diaries.  He was seen by Dr. Alvy Bimler and cleared to continue taking study drug. He completed the 12 month neurocog. booklet. He returned old bottle of study pills with 1 pill remaining.  Pill count confirmed with Raul Del, Pharm. D.  A new bottle of study drug was checked, verified, and dispensed per Raul Del, Pharm. D and this was given to the patient.  He understands he will return for a new bottle in 6 months.  He was given medication diaries for Dec. 2016 Through May 2017.  He verbalized comprehension to notify MD for any side effects or concerns.  Will continue to follow per protocol.

## 2014-12-28 NOTE — Telephone Encounter (Signed)
Gave and printed appt sched and avs for pt for March 2017 °

## 2015-04-04 ENCOUNTER — Emergency Department (HOSPITAL_COMMUNITY)
Admission: EM | Admit: 2015-04-04 | Discharge: 2015-04-04 | Disposition: A | Discharge status: Home / Self Care | Payer: BLUE CROSS/BLUE SHIELD | Attending: Emergency Medicine | Admitting: Emergency Medicine

## 2015-04-04 ENCOUNTER — Emergency Department (HOSPITAL_COMMUNITY): Payer: BLUE CROSS/BLUE SHIELD

## 2015-04-04 ENCOUNTER — Encounter (HOSPITAL_COMMUNITY): Payer: Self-pay | Admitting: *Deleted

## 2015-04-04 DIAGNOSIS — S71112A Laceration without foreign body, left thigh, initial encounter: Secondary | ICD-10-CM | POA: Insufficient documentation

## 2015-04-04 DIAGNOSIS — Z87891 Personal history of nicotine dependence: Secondary | ICD-10-CM | POA: Insufficient documentation

## 2015-04-04 DIAGNOSIS — I10 Essential (primary) hypertension: Secondary | ICD-10-CM | POA: Insufficient documentation

## 2015-04-04 DIAGNOSIS — Z862 Personal history of diseases of the blood and blood-forming organs and certain disorders involving the immune mechanism: Secondary | ICD-10-CM | POA: Insufficient documentation

## 2015-04-04 DIAGNOSIS — Z8659 Personal history of other mental and behavioral disorders: Secondary | ICD-10-CM | POA: Insufficient documentation

## 2015-04-04 DIAGNOSIS — Y998 Other external cause status: Secondary | ICD-10-CM | POA: Diagnosis not present

## 2015-04-04 DIAGNOSIS — Y9389 Activity, other specified: Secondary | ICD-10-CM | POA: Insufficient documentation

## 2015-04-04 DIAGNOSIS — Z79899 Other long term (current) drug therapy: Secondary | ICD-10-CM | POA: Diagnosis not present

## 2015-04-04 DIAGNOSIS — IMO0002 Reserved for concepts with insufficient information to code with codable children: Secondary | ICD-10-CM

## 2015-04-04 DIAGNOSIS — Y9289 Other specified places as the place of occurrence of the external cause: Secondary | ICD-10-CM | POA: Insufficient documentation

## 2015-04-04 DIAGNOSIS — G4733 Obstructive sleep apnea (adult) (pediatric): Secondary | ICD-10-CM | POA: Diagnosis not present

## 2015-04-04 DIAGNOSIS — S79922A Unspecified injury of left thigh, initial encounter: Secondary | ICD-10-CM | POA: Diagnosis present

## 2015-04-04 DIAGNOSIS — G8929 Other chronic pain: Secondary | ICD-10-CM | POA: Insufficient documentation

## 2015-04-04 DIAGNOSIS — Z87438 Personal history of other diseases of male genital organs: Secondary | ICD-10-CM | POA: Diagnosis not present

## 2015-04-04 MED ORDER — LIDOCAINE HCL (PF) 2 % IJ SOLN
INTRAMUSCULAR | Status: AC
  Filled 2015-04-04: qty 10

## 2015-04-04 MED ORDER — BACITRACIN-NEOMYCIN-POLYMYXIN 400-5-5000 EX OINT
TOPICAL_OINTMENT | Freq: Once | CUTANEOUS | Status: AC
Start: 2015-04-04 — End: 2015-04-04
  Administered 2015-04-04: 2 via TOPICAL
  Filled 2015-04-04: qty 2

## 2015-04-04 NOTE — ED Notes (Signed)
4x4 one packet, Koban x 1 roll and Ace x 1 for dressing

## 2015-04-04 NOTE — ED Provider Notes (Signed)
CSN: [id number]     Arrival date & time 04/04/15  1455 History  By signing my name below, I, Emmanuella Mensah, attest that this documentation has been prepared under the direction and in the presence of Evalee Jefferson, PA-C. Electronically Signed: Judithann Sauger, ED Scribe. 04/04/2015. 6:34 PM.     Chief Complaint  Patient presents with  . Leg Injury   The history is provided by the patient. No language interpreter was used.   HPI Comments: Jared Bentley is a 60 y.o. male with a hx of HTN who presents to the Emergency Department complaining of a currently controlled bleeding moderate laceration to the medial aspect of left upper thigh s/p leg injury that occurred today. He states that he has not tried to ambulate per advise of fire department. He explains that he was driving his tractor when a tree fell into his lap and a branch scrapped across his left upper thigh. No alleviating factors noted. Pt has not tried any medications PTA. He reports NKDA. He states that his tetanus vaccine is UTD. No fever, chills, n/v, or numbness.    Past Medical History  Diagnosis Date  . Hypertension   . Chronic back pain   . ED (erectile dysfunction)   . Glucose intolerance (impaired glucose tolerance)   . Anxiety   . Sleep apnea     severe OSA-uses a cpap  . Heavy alcohol consumption     6-12 beers daily  . Carpal tunnel syndrome   . Diffuse large B cell lymphoma ([place]) 12/30/2013   Past Surgical History  Procedure Laterality Date  . Vasectomy    . Colonoscopy w/ polypectomy    . Wisdom tooth extraction    . Lymph node biopsy Left 12/22/2013    Procedure: LEFT INGUINAL LYMPH NODE BIOPSY;  Surgeon: [name], MD;  Location: [facility];  Service: General;  Laterality: Left;  . Portacath placement N/A 01/06/2014    Procedure: INSERTION PORT-A-CATH;  Surgeon: [name], MD;  Location: [facility];  Service: General;  Laterality: N/A;  . Port-a-cath removal N/A 07/08/2014     Procedure: REMOVAL PORT-A-CATH;  Surgeon: [name], MD;  Location: [facility];  Service: General;  Laterality: N/A;  . Carpal tunnel release Left 07/29/2014    Procedure: CARPAL TUNNEL RELEASE;  Surgeon: Sanjuana Kava, MD;  Location: [facility];  Service: Orthopedics;  Laterality: Left;  . Carpal tunnel release Right 11/16/2014    Procedure: CARPAL TUNNEL RELEASE;  Surgeon: Sanjuana Kava, MD;  Location: [facility];  Service: Orthopedics;  Laterality: Right;   Family History  Problem Relation Age of Onset  . Cancer Father     bone  . Cancer Mother     tongue ca   Social History  Substance Use Topics  . Smoking status: Former Smoker -- 1.00 packs/day for 30 years    Quit date: 05/14/2004  . Smokeless tobacco: Never Used  . Alcohol Use: Yes     Comment: daily-6-12 beer    Review of Systems  Constitutional: Negative for fever and chills.  Gastrointestinal: Negative for nausea and vomiting.  Skin: Positive for wound.  Neurological: Negative for weakness and numbness.  All other systems reviewed and are negative.     Allergies  Review of patient's allergies indicates no known allergies.  Home Medications   Prior to Admission medications   Medication Sig Start Date End Date Taking? Authorizing Provider  Atorvastatin Calcium (INVESTIGATIONAL ATORVASTATIN/PLACEBO) 40 MG tablet [place]  Take 1 tablet by mouth daily. Take 1 tablet daily with or without food. 06/30/14   Heath Lark, MD  Atorvastatin Calcium (INVESTIGATIONAL ATORVASTATIN/PLACEBO) 40 MG tablet [facility] [phone] Take 1 tablet by mouth daily. Take 1 tablet daily with or without food. 12/28/14   Heath Lark, MD  enalapril (VASOTEC) 20 MG tablet Take 1 tablet (20 mg total) by mouth at bedtime. Patient taking differently: Take 20 mg by mouth every morning.  03/12/14   Heath Lark, MD  ENSTILAR 0.005-0.064 % FOAM  12/21/14   Historical Provider, MD   BP 148/91 mmHg  Pulse 90  Temp(Src) 98.4 F (36.9 C) (Oral)  Resp  20  Ht 6' (1.829 m)  Wt 127.007 kg  BMI 37.97 kg/m2  SpO2 96% Physical Exam  Constitutional: He is oriented to person, place, and time. He appears well-developed and well-nourished.  HENT:  Head: Normocephalic and atraumatic.  Cardiovascular: Normal rate.   Pulmonary/Chest: Effort normal.  Abdominal: He exhibits no distension.  Neurological: He is alert and oriented to person, place, and time.  Full sensation in left leg with full pedal pulses  Skin: Skin is warm and dry.  6 cm subcutaneous laceration, left upper medial thigh which is hemostatic. No apparent deep structure involvement. Explored and no fb appreciated.  Psychiatric: He has a normal mood and affect.  Nursing note and vitals reviewed.   ED Course  Procedures (including critical care time) DIAGNOSTIC STUDIES: Oxygen Saturation is 94% on RA, adequate by my interpretation.    COORDINATION OF CARE: 5:35 PM- Pt advised of plan for treatment and pt agrees. Pt informed of x-ray results. Pt will receive laceration repair.   LACERATION REPAIR PROCEDURE NOTE The patient's identification was confirmed and consent was obtained. This procedure was performed by Evalee Jefferson, PA-C at 6:30 PM. Site: medial left upper thigh Sterile procedures observed Anesthetic used (type and amt): 8 cc of 1% Lidocaine Suture type/size: Chromic gut 4-0 for subcutaneous stitches, Ethilone 4-0 for the surface Length:6 cm # of Sutures: 3 subcutaneous, interrupted. #11 surface running stitch Technique: per above Complexity: moderate Antibx ointment applied No Tetanus UTD or ordered, pt is UTD.  Site anesthetized, irrigated with NS, explored without evidence of foreign body, wound well approximated, site covered with dry, sterile dressing.  Patient tolerated procedure well without complications. Instructions for care discussed verbally and patient provided with additional written instructions for homecare and f/u.    Imaging Review Dg Femur Min  2 Views Left  04/04/2015  CLINICAL DATA:  Laceration while pushing down trees with a tractor today. EXAM: LEFT FEMUR 2 VIEWS COMPARISON:  None. FINDINGS: There are no fractures. Slight arthritis of the patella. No discrete foreign bodies in the soft tissues. IMPRESSION: No acute abnormality. Electronically Signed   By: Lorriane Shire M.D.   On: 04/04/2015 15:30   Evalee Jefferson, PA-C has personally reviewed and evaluated these images as part of her medical decision-making.  MDM   Final diagnoses:  Laceration    Wound care instructions given.  Pt advised to have sutures removed in 10 days,  Return here sooner for any signs of infection including redness, swelling, worse pain or drainage of pus.     I personally performed the services described in this documentation, which was scribed in my presence. The recorded information has been reviewed and is accurate.   Evalee Jefferson, PA-C 04/06/15 1228  Merrily Pew, MD 04/06/15 [phone]

## 2015-04-04 NOTE — ED Notes (Addendum)
Pt was on a tractor when a tree fell into his lap. Pt has laceration to his left upper thigh. Pt has bleeding under control at this time.   Tammy Triplett in triage evaluating patient.

## 2015-04-14 ENCOUNTER — Ambulatory Visit (INDEPENDENT_AMBULATORY_CARE_PROVIDER_SITE_OTHER): Payer: BLUE CROSS/BLUE SHIELD | Admitting: Family Medicine

## 2015-04-14 ENCOUNTER — Encounter: Payer: Self-pay | Admitting: Family Medicine

## 2015-04-14 VITALS — BP 124/80 | Ht 72.0 in | Wt 284.4 lb

## 2015-04-14 DIAGNOSIS — L03116 Cellulitis of left lower limb: Secondary | ICD-10-CM

## 2015-04-14 DIAGNOSIS — Z4802 Encounter for removal of sutures: Secondary | ICD-10-CM

## 2015-04-14 MED ORDER — DOXYCYCLINE HYCLATE 100 MG PO CAPS: 100.0000 mg | ORAL_CAPSULE | Freq: Two times a day (BID) | ORAL | Status: DC

## 2015-04-14 NOTE — Progress Notes (Signed)
   Subjective:    Patient ID: Jared Bentley, male    DOB: 11-09-1955, 60 y.o.   MRN: [id number]  HPI Patient is here today to have stitches removed. Patient is doing well. Patient has no other concerns at this time.   ER note reviewed  sutures are placed proximal and 10 days ago Some redness tenderness no drainage  significant bruising from the injury Patient was instructed to remove his pants so doctor can have assess to the area.   Review of Systems     No fever chills sweats or drainage. Objective:   Physical Exam  slight redness around the sutures. Some tenderness some thickening of the skin consistent with very early cellulitis       Assessment & Plan:   warm compresses on a frequent basis Antibiotics prescribed If progressive troubles or if worse follow-up   warning signs were discussed in detail Sutures removed without difficulty.

## 2015-04-28 ENCOUNTER — Encounter: Payer: Self-pay | Admitting: Hematology and Oncology

## 2015-04-28 ENCOUNTER — Ambulatory Visit (HOSPITAL_BASED_OUTPATIENT_CLINIC_OR_DEPARTMENT_OTHER): Payer: BLUE CROSS/BLUE SHIELD | Admitting: Hematology and Oncology

## 2015-04-28 ENCOUNTER — Telehealth: Payer: Self-pay | Admitting: Hematology and Oncology

## 2015-04-28 ENCOUNTER — Other Ambulatory Visit (HOSPITAL_BASED_OUTPATIENT_CLINIC_OR_DEPARTMENT_OTHER): Payer: BLUE CROSS/BLUE SHIELD

## 2015-04-28 VITALS — BP 126/84 | HR 67 | Temp 98.2°F | Resp 18 | Ht 72.0 in | Wt 283.7 lb

## 2015-04-28 DIAGNOSIS — Z8572 Personal history of non-Hodgkin lymphomas: Secondary | ICD-10-CM

## 2015-04-28 DIAGNOSIS — Y909 Presence of alcohol in blood, level not specified: Secondary | ICD-10-CM | POA: Diagnosis not present

## 2015-04-28 DIAGNOSIS — C8330 Diffuse large B-cell lymphoma, unspecified site: Secondary | ICD-10-CM

## 2015-04-28 DIAGNOSIS — F1020 Alcohol dependence, uncomplicated: Secondary | ICD-10-CM | POA: Diagnosis not present

## 2015-04-28 DIAGNOSIS — I10 Essential (primary) hypertension: Secondary | ICD-10-CM

## 2015-04-28 LAB — CBC WITH DIFFERENTIAL/PLATELET
BASO%: 1.3 % (ref 0.0–2.0)
Basophils Absolute: 0.1 10*3/uL (ref 0.0–0.1)
EOS%: 3.1 % (ref 0.0–7.0)
Eosinophils Absolute: 0.2 10*3/uL (ref 0.0–0.5)
HEMATOCRIT: 42.9 % (ref 38.4–49.9)
HGB: 14.5 g/dL (ref 13.0–17.1)
LYMPH#: 1.2 10*3/uL (ref 0.9–3.3)
LYMPH%: 20.5 % (ref 14.0–49.0)
MCH: 32.5 pg (ref 27.2–33.4)
MCHC: 33.9 g/dL (ref 32.0–36.0)
MCV: 96 fL (ref 79.3–98.0)
MONO#: 0.8 10*3/uL (ref 0.1–0.9)
MONO%: 13.1 % (ref 0.0–14.0)
NEUT%: 62 % (ref 39.0–75.0)
NEUTROS ABS: 3.6 10*3/uL (ref 1.5–6.5)
PLATELETS: 209 10*3/uL (ref 140–400)
RBC: 4.47 10*6/uL (ref 4.20–5.82)
RDW: 13.5 % (ref 11.0–14.6)
WBC: 5.9 10*3/uL (ref 4.0–10.3)

## 2015-04-28 LAB — COMPREHENSIVE METABOLIC PANEL
ALBUMIN: 3.9 g/dL (ref 3.5–5.0)
ALK PHOS: 81 U/L (ref 40–150)
ALT: 39 U/L (ref 0–55)
ANION GAP: 8 meq/L (ref 3–11)
AST: 31 U/L (ref 5–34)
BILIRUBIN TOTAL: 0.86 mg/dL (ref 0.20–1.20)
BUN: 13.9 mg/dL (ref 7.0–26.0)
CALCIUM: 9.6 mg/dL (ref 8.4–10.4)
CO2: 27 meq/L (ref 22–29)
CREATININE: 1 mg/dL (ref 0.7–1.3)
Chloride: 103 mEq/L (ref 98–109)
EGFR: 81 mL/min/{1.73_m2} — AB (ref >90)
Glucose: 110 mg/dl (ref 70–140)
Potassium: 4 mEq/L (ref 3.5–5.1)
Sodium: 139 mEq/L (ref 136–145)
TOTAL PROTEIN: 7.1 g/dL (ref 6.4–8.3)

## 2015-04-28 LAB — LACTATE DEHYDROGENASE: LDH: 179 U/L (ref 125–245)

## 2015-04-28 MED ORDER — ENALAPRIL MALEATE 20 MG PO TABS: 20.0000 mg | ORAL_TABLET | Freq: Every morning | ORAL | Status: DC

## 2015-04-28 NOTE — Assessment & Plan Note (Signed)
Clinically, he had no signs of disease. PET CT scan from 2016 showed complete remission. The patient will continue to be enrolled in clinical trial. I will see him back in 6 months with history, physical examination and blood work. I plan to defer imaging study to the future only as needed if there are clinical signs suspicious for disease recurrence. I do not recommend routine surveillance imaging for him.

## 2015-04-28 NOTE — Assessment & Plan Note (Signed)
The patient is recommended to quit alcohol intake  He will try to quit

## 2015-04-28 NOTE — Assessment & Plan Note (Signed)
he will continue current medical management. I recommend close follow-up with primary care doctor for medication adjustment. I refill his prescription per patient request

## 2015-04-28 NOTE — Telephone Encounter (Signed)
Gave and printed appt sched and avs for pt for Sept °

## 2015-04-28 NOTE — Progress Notes (Signed)
[place] OFFICE PROGRESS NOTE  Patient Care Team: Mikey Kirschner, MD as PCP - General (Family Medicine) Fay Records, MD as Consulting Physician (Cardiology) Carola Frost, RN as Registered Nurse  SUMMARY OF ONCOLOGIC HISTORY: Oncology History   Diffuse large B cell lymphoma   Staging form: Lymphoid Neoplasms, AJCC 6th Edition     Clinical stage from 12/30/2013: Stage III - Signed by Heath Lark, MD on 01/08/2014     Pathologic: No stage assigned - Unsigned       History of B-cell lymphoma   12/14/2013 Imaging CT scan of the abdomen and pelvis showed bulky left iliac chain and left inguinal adenopathy, highly worrisome for lymphoma.   12/22/2013 Surgery He underwent excisional lymph node biopsy of the left inguinal region that confirm lymphoma diagnosis. No   12/22/2013 Pathology Results Accession: [id number] pathology showed a high-grade follicular lymphoma with possibly foci of diffuse large B-cell lymphoma   01/04/2014 Bone Marrow Biopsy BM biopsy was negative with normal cytogenetics   01/05/2014 Imaging PET CT scan showed lymph nodes involvement of both sides of her diaphragm   01/06/2014 Surgery He has port placed   01/07/2014 Imaging ECHO showed preserved EF of 73%, mildly dilated atrium   01/12/2014 - 04/27/2014 Chemotherapy He is received 6 cycles of R CHOP chemotherapy   02/02/2014 Adverse Reaction Vincristine dose was reduced by 50% due to early signs of peripheral neuropathy   03/11/2014 Imaging Repeat PET scan showed near complete response to treatment   06/07/2014 Imaging PET scan showed complete response to treatment    INTERVAL HISTORY: Please see below for problem oriented charting. He feels well. No new lymphadenopathy. His appetite is stable, no recent weight loss. No new skin lesions. No new neuropathy. The patient continues to drink alcohol intermittently  REVIEW OF SYSTEMS:   Constitutional: Denies fevers, chills or abnormal weight loss Eyes: Denies  blurriness of vision Ears, nose, mouth, throat, and face: Denies mucositis or sore throat Respiratory: Denies cough, dyspnea or wheezes Cardiovascular: Denies palpitation, chest discomfort or lower extremity swelling Gastrointestinal:  Denies nausea, heartburn or change in bowel habits Skin: Denies abnormal skin rashes Lymphatics: Denies new lymphadenopathy or easy bruising Neurological:Denies numbness, tingling or new weaknesses Behavioral/Psych: Mood is stable, no new changes  All other systems were reviewed with the patient and are negative.  I have reviewed the past medical history, past surgical history, social history and family history with the patient and they are unchanged from previous note.  ALLERGIES:  has No Known Allergies.  MEDICATIONS:  Current Outpatient Prescriptions  Medication Sig Dispense Refill  . Atorvastatin Calcium (INVESTIGATIONAL ATORVASTATIN/PLACEBO) 40 MG tablet [facility] 95638 Take 1 tablet by mouth daily. Take 1 tablet daily with or without food. 180 tablet 0  . enalapril (VASOTEC) 20 MG tablet Take 1 tablet (20 mg total) by mouth every morning. 90 tablet 3  . ENSTILAR 0.005-0.064 % FOAM      No current facility-administered medications for this visit.    PHYSICAL EXAMINATION: ECOG PERFORMANCE STATUS: 0 - Asymptomatic  Filed Vitals:   04/28/15 0804  BP: 126/84  Pulse: 67  Temp: 98.2 F (36.8 C)  Resp: 18   Filed Weights   04/28/15 0804  Weight: 283 lb 11.2 oz (128.685 kg)    GENERAL:alert, no distress and comfortable SKIN: skin color, texture, turgor are normal, no rashes or significant lesions EYES: normal, Conjunctiva are pink and non-injected, sclera clear OROPHARYNX:no exudate, no erythema and lips, buccal  mucosa, and tongue normal  NECK: supple, thyroid normal size, non-tender, without nodularity LYMPH:  no palpable lymphadenopathy in the cervical, axillary or inguinal LUNGS: clear to auscultation and percussion with normal  breathing effort HEART: regular rate & rhythm and no murmurs and no lower extremity edema ABDOMEN:abdomen soft, non-tender and normal bowel sounds Musculoskeletal:no cyanosis of digits and no clubbing  NEURO: alert & oriented x 3 with fluent speech, no focal motor/sensory deficits  LABORATORY DATA:  I have reviewed the data as listed    Component Value Date/Time   NA 139 04/28/2015 0751   NA 136 11/11/2014 0931   K 4.0 04/28/2015 0751   K 4.1 11/11/2014 0931   CL 103 11/11/2014 0931   CO2 27 04/28/2015 0751   CO2 25 11/11/2014 0931   GLUCOSE 110 04/28/2015 0751   GLUCOSE 110* 11/11/2014 0931   BUN 13.9 04/28/2015 0751   BUN 19 11/11/2014 0931   CREATININE 1.0 04/28/2015 0751   CREATININE 0.98 11/11/2014 0931   CREATININE 0.98 12/09/2013 0716   CALCIUM 9.6 04/28/2015 0751   CALCIUM 9.0 11/11/2014 0931   PROT 7.1 04/28/2015 0751   PROT 6.7 11/11/2014 0931   ALBUMIN 3.9 04/28/2015 0751   ALBUMIN 4.1 11/11/2014 0931   AST 31 04/28/2015 0751   AST 38 11/11/2014 0931   ALT 39 04/28/2015 0751   ALT 53 11/11/2014 0931   ALKPHOS 81 04/28/2015 0751   ALKPHOS 64 11/11/2014 0931   BILITOT 0.86 04/28/2015 0751   BILITOT 0.8 11/11/2014 0931   GFRNONAA >60 11/11/2014 0931   GFRAA >60 11/11/2014 0931    No results found for: SPEP, UPEP  Lab Results  Component Value Date   WBC 5.9 04/28/2015   NEUTROABS 3.6 04/28/2015   HGB 14.5 04/28/2015   HCT 42.9 04/28/2015   MCV 96.0 04/28/2015   PLT 209 04/28/2015      Chemistry      Component Value Date/Time   NA 139 04/28/2015 0751   NA 136 11/11/2014 0931   K 4.0 04/28/2015 0751   K 4.1 11/11/2014 0931   CL 103 11/11/2014 0931   CO2 27 04/28/2015 0751   CO2 25 11/11/2014 0931   BUN 13.9 04/28/2015 0751   BUN 19 11/11/2014 0931   CREATININE 1.0 04/28/2015 0751   CREATININE 0.98 11/11/2014 0931   CREATININE 0.98 12/09/2013 0716      Component Value Date/Time   CALCIUM 9.6 04/28/2015 0751   CALCIUM 9.0 11/11/2014 0931    ALKPHOS 81 04/28/2015 0751   ALKPHOS 64 11/11/2014 0931   AST 31 04/28/2015 0751   AST 38 11/11/2014 0931   ALT 39 04/28/2015 0751   ALT 53 11/11/2014 0931   BILITOT 0.86 04/28/2015 0751   BILITOT 0.8 11/11/2014 0931      ASSESSMENT & PLAN:  History of B-cell lymphoma Clinically, he had no signs of disease. PET CT scan from 2016 showed complete remission. The patient will continue to be enrolled in clinical trial. I will see him back in 6 months with history, physical examination and blood work. I plan to defer imaging study to the future only as needed if there are clinical signs suspicious for disease recurrence. I do not recommend routine surveillance imaging for him.  Chronic alcoholism The patient is recommended to quit alcohol intake  He will try to quit   Essential hypertension, benign he will continue current medical management. I recommend close follow-up with primary care doctor for medication adjustment. I refill his prescription  per patient request    No orders of the defined types were placed in this encounter.   All questions were answered. The patient knows to call the clinic with any problems, questions or concerns. No barriers to learning was detected. I spent 15 minutes counseling the patient face to face. The total time spent in the appointment was 20 minutes and more than 50% was on counseling and review of test results     [facility], [place], MD 04/28/2015 9:09 AM

## 2015-06-22 ENCOUNTER — Encounter: Payer: Self-pay | Admitting: *Deleted

## 2015-06-22 DIAGNOSIS — Z8572 Personal history of non-Hodgkin lymphomas: Secondary | ICD-10-CM

## 2015-06-23 ENCOUNTER — Other Ambulatory Visit: Payer: Self-pay | Admitting: *Deleted

## 2015-06-23 ENCOUNTER — Encounter: Payer: BLUE CROSS/BLUE SHIELD | Admitting: *Deleted

## 2015-06-23 ENCOUNTER — Encounter: Payer: Self-pay | Admitting: Hematology and Oncology

## 2015-06-23 MED ORDER — INV-ATORVASTATIN/PLACEBO 40 MG TABS WAKE FOREST WF 98213: 1.0000 | ORAL_TABLET | Freq: Every day | ORAL | Status: DC

## 2015-06-23 NOTE — Progress Notes (Signed)
06/23/2015 0900 18 month PREVENT visit The patient returns to the clinic alone for 18 mos visit.  He reports not missing any doses of drug.  He denies having any side effects, including abdominal pain or other GI symptoms, muscle aches, headaches/neurological symptoms or respiratory symptoms.  "I have no side effects at all" he reports. He has completed and turned in all of his medication diaries through April 2017.  He reports not starting any new medications or having any changes in his medications. He completed the 18 month neurocog. booklet. He returned his old bottle of study drug with 3 pills remaining.  This was confirmed with Abbey Chatters, Pharm. D.  A new bottle of pills was checked and dispensed per M. Gjurati, Pharm. D, kit # [id number].  The patient was given the new bottle of pills along with medication diaries for June through December.  The patient understands this is the last bottle of pills and that the 2 year point for the study is end of Nov./early Dec.  He was without questions. This RN thanked the patient for his time and dedication tot he PREVENT study. Jared Scott, RN, BSN, MHA, OCN

## 2015-08-05 ENCOUNTER — Telehealth: Payer: Self-pay | Admitting: Family Medicine

## 2015-08-05 NOTE — Telephone Encounter (Signed)
[facility] 08/05/15

## 2015-08-05 NOTE — Telephone Encounter (Signed)
Spoke with patient's wife and informed her per Dr.Steve Luking-There are kinds of things we cover that the cancer doctor do not, recommend ongoing previous/problem visits yearly even when under the care of a cancer doctor. Patient's wife verbalized understanding.

## 2015-08-05 NOTE — Telephone Encounter (Signed)
Pt's wife made the pt a wellness appt. Wife stated that the pt has been under the care of a cancer Dr since 2015, and that Dr is suggesting that he starts seeing his pcp again. Wife wants to know if Dr. Richardson Landry agrees. If pt is still in remission in Nov he will end care with the cancer Dr.  Hansel Feinstein wants to know if he should wait till then. Pt currently has an appt for the end of July for a wellness visit.

## 2015-08-05 NOTE — Telephone Encounter (Signed)
There are all kinds of things we cover that the cancer doc does not, recommend ongoing prev/problem visits yrly even when under care of coancer doc

## 2015-08-29 ENCOUNTER — Ambulatory Visit (INDEPENDENT_AMBULATORY_CARE_PROVIDER_SITE_OTHER): Payer: BLUE CROSS/BLUE SHIELD | Admitting: Family Medicine

## 2015-08-29 ENCOUNTER — Encounter: Payer: Self-pay | Admitting: Family Medicine

## 2015-08-29 VITALS — BP 118/82 | Ht 72.0 in | Wt 279.8 lb

## 2015-08-29 DIAGNOSIS — Z0000 Encounter for general adult medical examination without abnormal findings: Secondary | ICD-10-CM | POA: Diagnosis not present

## 2015-08-29 DIAGNOSIS — Z125 Encounter for screening for malignant neoplasm of prostate: Secondary | ICD-10-CM

## 2015-08-29 NOTE — Progress Notes (Signed)
   Subjective:    Patient ID: Jared Bentley, male    DOB: March 10, 1955, 60 y.o.   MRN: [id number]  HPI  The patient comes in today for a wellness visit.  Pt now on chol med as a study, ques if it reduces choemotherapy not sur e on a double blinded study  Over in [place] as far as the lipitor  A review of their health history was completed.  A review of medications was also completed.  Any needed refills; yes- enalapril  Eating habits: eating good  Falls/  MVA accidents in past few months: none  Regular exercise: stays busy but nothing specific  Specialist pt sees on regular basis: cancer dr  Preventative health issues were discussed.   Additional concerns: none  Exercise was going reg  Flared up knee pain six mo ago, but trying to stay active  Last colon done in eight yrs, two more to go     Diet eats the right stuff but al ot  Review of Systems  Constitutional: Negative for activity change, appetite change and fever.  HENT: Negative for congestion and rhinorrhea.   Eyes: Negative for discharge.  Respiratory: Negative for cough and wheezing.   Cardiovascular: Negative for chest pain.  Gastrointestinal: Negative for abdominal pain, blood in stool and vomiting.  Genitourinary: Negative for difficulty urinating and frequency.  Musculoskeletal: Negative for neck pain.  Skin: Negative for rash.  Allergic/Immunologic: Negative for environmental allergies and food allergies.  Neurological: Negative for weakness and headaches.  Psychiatric/Behavioral: Negative for agitation.  All other systems reviewed and are negative.      Objective:   Physical Exam  Constitutional: He appears well-developed and well-nourished.  Obesity present elevated BMI  HENT:  Head: Normocephalic and atraumatic.  Right Ear: External ear normal.  Left Ear: External ear normal.  Nose: Nose normal.  Mouth/Throat: Oropharynx is clear and moist.  Eyes: EOM are normal. Pupils are equal, round,  and reactive to light.  Neck: Normal range of motion. Neck supple. No thyromegaly present.  Cardiovascular: Normal rate, regular rhythm and normal heart sounds.   No murmur heard. Pulmonary/Chest: Effort normal and breath sounds normal. No respiratory distress. He has no wheezes.  Abdominal: Soft. Bowel sounds are normal. He exhibits no distension and no mass. There is no tenderness.  Genitourinary: Penis normal.  Genitourinary Comments: Prostate within normal limits small hemorrhoid  Musculoskeletal: Normal range of motion. He exhibits no edema.  Lymphadenopathy:    He has no cervical adenopathy.  Neurological: He is alert. He exhibits normal muscle tone.  Skin: Skin is warm and dry. No erythema.  Psychiatric: He has a normal mood and affect. His behavior is normal. Judgment normal.  Vitals reviewed.         Assessment & Plan:  Impression 1 wellness exam up to date on colonoscopy Due in 2 years #2 status post treatment for lymphoma #3 hypertension good control #4 on Lipitor as a study drug for chemotherapy patient's plan to PSA. Diet exercise discussed. Recheck in 6 months. Shingles vaccine WSL

## 2015-08-30 ENCOUNTER — Other Ambulatory Visit: Payer: Self-pay | Admitting: Family Medicine

## 2015-08-31 LAB — PSA: PSA: 0.73 ng/mL (ref <=4.00)

## 2015-09-04 ENCOUNTER — Encounter: Payer: Self-pay | Admitting: Family Medicine

## 2015-10-31 ENCOUNTER — Other Ambulatory Visit (HOSPITAL_BASED_OUTPATIENT_CLINIC_OR_DEPARTMENT_OTHER): Payer: BLUE CROSS/BLUE SHIELD

## 2015-10-31 ENCOUNTER — Encounter: Payer: Self-pay | Admitting: Hematology and Oncology

## 2015-10-31 ENCOUNTER — Ambulatory Visit (HOSPITAL_BASED_OUTPATIENT_CLINIC_OR_DEPARTMENT_OTHER): Payer: BLUE CROSS/BLUE SHIELD | Admitting: Hematology and Oncology

## 2015-10-31 ENCOUNTER — Telehealth: Payer: Self-pay | Admitting: Hematology and Oncology

## 2015-10-31 VITALS — BP 109/70 | HR 70 | Temp 98.3°F | Resp 18 | Ht 72.0 in | Wt 281.3 lb

## 2015-10-31 DIAGNOSIS — Z299 Encounter for prophylactic measures, unspecified: Secondary | ICD-10-CM

## 2015-10-31 DIAGNOSIS — Z8572 Personal history of non-Hodgkin lymphomas: Secondary | ICD-10-CM | POA: Diagnosis not present

## 2015-10-31 DIAGNOSIS — R748 Abnormal levels of other serum enzymes: Secondary | ICD-10-CM | POA: Diagnosis not present

## 2015-10-31 DIAGNOSIS — F1020 Alcohol dependence, uncomplicated: Secondary | ICD-10-CM | POA: Diagnosis not present

## 2015-10-31 LAB — CBC WITH DIFFERENTIAL/PLATELET
BASO%: 0.8 % (ref 0.0–2.0)
Basophils Absolute: 0 10*3/uL (ref 0.0–0.1)
EOS%: 3 % (ref 0.0–7.0)
Eosinophils Absolute: 0.2 10*3/uL (ref 0.0–0.5)
HEMATOCRIT: 44.5 % (ref 38.4–49.9)
HEMOGLOBIN: 15.1 g/dL (ref 13.0–17.1)
LYMPH#: 1.4 10*3/uL (ref 0.9–3.3)
LYMPH%: 21.6 % (ref 14.0–49.0)
MCH: 33.3 pg (ref 27.2–33.4)
MCHC: 33.9 g/dL (ref 32.0–36.0)
MCV: 98.2 fL — ABNORMAL HIGH (ref 79.3–98.0)
MONO#: 0.7 10*3/uL (ref 0.1–0.9)
MONO%: 11.4 % (ref 0.0–14.0)
NEUT#: 4.1 10*3/uL (ref 1.5–6.5)
NEUT%: 63.2 % (ref 39.0–75.0)
PLATELETS: 222 10*3/uL (ref 140–400)
RBC: 4.53 10*6/uL (ref 4.20–5.82)
RDW: 13.5 % (ref 11.0–14.6)
WBC: 6.5 10*3/uL (ref 4.0–10.3)

## 2015-10-31 LAB — LACTATE DEHYDROGENASE: LDH: 164 U/L (ref 125–245)

## 2015-10-31 LAB — COMPREHENSIVE METABOLIC PANEL WITH GFR
ALT: 57 U/L — ABNORMAL HIGH (ref 0–55)
AST: 41 U/L — ABNORMAL HIGH (ref 5–34)
Albumin: 3.6 g/dL (ref 3.5–5.0)
Alkaline Phosphatase: 83 U/L (ref 40–150)
Anion Gap: 11 meq/L (ref 3–11)
BUN: 10.3 mg/dL (ref 7.0–26.0)
CO2: 24 meq/L (ref 22–29)
Calcium: 9.9 mg/dL (ref 8.4–10.4)
Chloride: 105 meq/L (ref 98–109)
Creatinine: 0.9 mg/dL (ref 0.7–1.3)
EGFR: 87 ml/min/1.73 m2 — ABNORMAL LOW
Glucose: 107 mg/dL (ref 70–140)
Potassium: 4.1 meq/L (ref 3.5–5.1)
Sodium: 140 meq/L (ref 136–145)
Total Bilirubin: 0.82 mg/dL (ref 0.20–1.20)
Total Protein: 7 g/dL (ref 6.4–8.3)

## 2015-10-31 NOTE — Assessment & Plan Note (Signed)
The patient is recommended to quit alcohol intake  He will try to quit I suspect a recent ingestion of alcohol as a cause of mild macrocytosis and elevated liver enzymes. I recommend observation only for now  

## 2015-10-31 NOTE — Assessment & Plan Note (Signed)
We discussed the importance of influenza vaccination. The patient wants to get it himself through his local pharmacist and decline influenza vaccination today.

## 2015-10-31 NOTE — Assessment & Plan Note (Signed)
Clinically, he had no signs of disease. PET CT scan from 2016 showed complete remission. The patient will continue to be enrolled in clinical trial. I will see him back in 6 months with history, physical examination and blood work.  He is due for one more CT scan in 6 months. If the neck scan showed no evidence of disease recurrence, I will discontinue routine surveillance imaging for him.

## 2015-10-31 NOTE — Telephone Encounter (Signed)
Gave patient avs report and appointments for March. Central radiology will call re scan - patient aware.  °

## 2015-10-31 NOTE — Assessment & Plan Note (Signed)
He has elevated liver enzymes, likely due to recent alcohol intake and possibly fatty liver disease. I recommend observation only

## 2015-10-31 NOTE — Progress Notes (Signed)
[place] OFFICE PROGRESS NOTE  Patient Care Team: Mikey Kirschner, MD as PCP - General (Family Medicine) Fay Records, MD as Consulting Physician (Cardiology) Carola Frost, RN as Registered Nurse  SUMMARY OF ONCOLOGIC HISTORY: Oncology History   Diffuse large B cell lymphoma   Staging form: Lymphoid Neoplasms, AJCC 6th Edition     Clinical stage from 12/30/2013: Stage III - Signed by Heath Lark, MD on 01/08/2014     Pathologic: No stage assigned - Unsigned       History of B-cell lymphoma   12/14/2013 Imaging    CT scan of the abdomen and pelvis showed bulky left iliac chain and left inguinal adenopathy, highly worrisome for lymphoma.      12/22/2013 Surgery    He underwent excisional lymph node biopsy of the left inguinal region that confirm lymphoma diagnosis. No      12/22/2013 Pathology Results    Accession: [id number] pathology showed a high-grade follicular lymphoma with possibly foci of diffuse large B-cell lymphoma      01/04/2014 Bone Marrow Biopsy    BM biopsy was negative with normal cytogenetics      01/05/2014 Imaging    PET CT scan showed lymph nodes involvement of both sides of her diaphragm      01/06/2014 Surgery    He has port placed      01/07/2014 Imaging    ECHO showed preserved EF of 73%, mildly dilated atrium      01/12/2014 - 04/27/2014 Chemotherapy    He is received 6 cycles of R CHOP chemotherapy      02/02/2014 Adverse Reaction    Vincristine dose was reduced by 50% due to early signs of peripheral neuropathy      03/11/2014 Imaging    Repeat PET scan showed near complete response to treatment      06/07/2014 Imaging    PET scan showed complete response to treatment       INTERVAL HISTORY: Please see below for problem oriented charting. He feels well. No new lymphadenopathy. Denies recent infection. No recent anorexia, night sweats or weight loss  REVIEW OF SYSTEMS:   Constitutional: Denies fevers, chills or  abnormal weight loss Eyes: Denies blurriness of vision Ears, nose, mouth, throat, and face: Denies mucositis or sore throat Respiratory: Denies cough, dyspnea or wheezes Cardiovascular: Denies palpitation, chest discomfort or lower extremity swelling Gastrointestinal:  Denies nausea, heartburn or change in bowel habits Skin: Denies abnormal skin rashes Lymphatics: Denies new lymphadenopathy or easy bruising Neurological:Denies numbness, tingling or new weaknesses Behavioral/Psych: Mood is stable, no new changes  All other systems were reviewed with the patient and are negative.  I have reviewed the past medical history, past surgical history, social history and family history with the patient and they are unchanged from previous note.  ALLERGIES:  has No Known Allergies.  MEDICATIONS:  Current Outpatient Prescriptions  Medication Sig Dispense Refill  . Atorvastatin Calcium (INVESTIGATIONAL ATORVASTATIN/PLACEBO) 40 MG tablet [facility] 40981 Take 1 tablet by mouth daily. Take 1 tablet daily with or without food. 180 tablet 0  . enalapril (VASOTEC) 20 MG tablet Take 1 tablet (20 mg total) by mouth every morning. 90 tablet 3  . ENSTILAR 0.005-0.064 % FOAM      No current facility-administered medications for this visit.     PHYSICAL EXAMINATION: ECOG PERFORMANCE STATUS: 0 - Asymptomatic  Vitals:   10/31/15 0819  BP: 109/70  Pulse: 70  Resp: 18  Temp: 98.3  F (36.8 C)   Filed Weights   10/31/15 0819  Weight: 281 lb 4.8 oz (127.6 kg)    GENERAL:alert, no distress and comfortable. He is moderately obese SKIN: skin color, texture, turgor are normal, no rashes or significant lesions EYES: normal, Conjunctiva are pink and non-injected, sclera clear OROPHARYNX:no exudate, no erythema and lips, buccal mucosa, and tongue normal  NECK: supple, thyroid normal size, non-tender, without nodularity LYMPH:  no palpable lymphadenopathy in the cervical, axillary or inguinal LUNGS:  clear to auscultation and percussion with normal breathing effort HEART: regular rate & rhythm and no murmurs and no lower extremity edema ABDOMEN:abdomen soft, non-tender and normal bowel sounds Musculoskeletal:no cyanosis of digits and no clubbing  NEURO: alert & oriented x 3 with fluent speech, no focal motor/sensory deficits  LABORATORY DATA:  I have reviewed the data as listed    Component Value Date/Time   NA 140 10/31/2015 0748   K 4.1 10/31/2015 0748   CL 103 11/11/2014 0931   CO2 24 10/31/2015 0748   GLUCOSE 107 10/31/2015 0748   BUN 10.3 10/31/2015 0748   CREATININE 0.9 10/31/2015 0748   CALCIUM 9.9 10/31/2015 0748   PROT 7.0 10/31/2015 0748   ALBUMIN 3.6 10/31/2015 0748   AST 41 (H) 10/31/2015 0748   ALT 57 (H) 10/31/2015 0748   ALKPHOS 83 10/31/2015 0748   BILITOT 0.82 10/31/2015 0748   GFRNONAA >60 11/11/2014 0931   GFRAA >60 11/11/2014 0931    No results found for: SPEP, UPEP  Lab Results  Component Value Date   WBC 6.5 10/31/2015   NEUTROABS 4.1 10/31/2015   HGB 15.1 10/31/2015   HCT 44.5 10/31/2015   MCV 98.2 (H) 10/31/2015   PLT 222 10/31/2015      Chemistry      Component Value Date/Time   NA 140 10/31/2015 0748   K 4.1 10/31/2015 0748   CL 103 11/11/2014 0931   CO2 24 10/31/2015 0748   BUN 10.3 10/31/2015 0748   CREATININE 0.9 10/31/2015 0748      Component Value Date/Time   CALCIUM 9.9 10/31/2015 0748   ALKPHOS 83 10/31/2015 0748   AST 41 (H) 10/31/2015 0748   ALT 57 (H) 10/31/2015 0748   BILITOT 0.82 10/31/2015 0748       ASSESSMENT & PLAN:  History of B-cell lymphoma Clinically, he had no signs of disease. PET CT scan from 2016 showed complete remission. The patient will continue to be enrolled in clinical trial. I will see him back in 6 months with history, physical examination and blood work.  He is due for one more CT scan in 6 months. If the neck scan showed no evidence of disease recurrence, I will discontinue routine  surveillance imaging for him.  Elevated liver enzymes He has elevated liver enzymes, likely due to recent alcohol intake and possibly fatty liver disease. I recommend observation only  Chronic alcoholism The patient is recommended to quit alcohol intake  He will try to quit I suspect a recent ingestion of alcohol as a cause of mild macrocytosis and elevated liver enzymes. I recommend observation only for now   Preventive measure We discussed the importance of influenza vaccination. The patient wants to get it himself through his local pharmacist and decline influenza vaccination today.   Orders Placed This Encounter  Procedures  . CT CHEST W CONTRAST    Standing Status:   Future    Standing Expiration Date:   12/04/2016    Order Specific Question:  Reason for exam:    Answer:   staging lymphoma, exclude recurrence    Order Specific Question:   Preferred imaging location?    Answer:   [facility]  . CT ABDOMEN PELVIS W CONTRAST    Standing Status:   Future    Standing Expiration Date:   12/04/2016    Order Specific Question:   Reason for exam:    Answer:   staging lymphoma, exclude recurrence    Order Specific Question:   Preferred imaging location?    Answer:   [facility]  . CBC with Differential/Platelet    Standing Status:   Future    Number of Occurrences:   1    Standing Expiration Date:   12/04/2016  . Comprehensive metabolic panel    Standing Status:   Future    Number of Occurrences:   1    Standing Expiration Date:   12/04/2016  . Lactate dehydrogenase    Standing Status:   Future    Number of Occurrences:   1    Standing Expiration Date:   12/04/2016  . CBC with Differential/Platelet    Standing Status:   Future    Standing Expiration Date:   12/04/2016  . Comprehensive metabolic panel    Standing Status:   Future    Standing Expiration Date:   12/04/2016  . Lactate dehydrogenase    Standing Status:   Future    Standing Expiration  Date:   12/04/2016   All questions were answered. The patient knows to call the clinic with any problems, questions or concerns. No barriers to learning was detected. I spent 15 minutes counseling the patient face to face. The total time spent in the appointment was 20 minutes and more than 50% was on counseling and review of test results     Heath Lark, MD 10/31/2015 10:34 AM

## 2015-11-02 ENCOUNTER — Other Ambulatory Visit: Payer: Self-pay | Admitting: *Deleted

## 2015-11-02 ENCOUNTER — Other Ambulatory Visit (HOSPITAL_COMMUNITY): Payer: Self-pay | Admitting: Obstetrics & Gynecology

## 2015-11-02 ENCOUNTER — Other Ambulatory Visit: Payer: Self-pay | Admitting: Hematology and Oncology

## 2015-11-02 DIAGNOSIS — C8100 Nodular lymphocyte predominant Hodgkin lymphoma, unspecified site: Secondary | ICD-10-CM

## 2015-11-02 DIAGNOSIS — Z8572 Personal history of non-Hodgkin lymphomas: Secondary | ICD-10-CM

## 2015-11-02 NOTE — Progress Notes (Signed)
Notified patient of appointment for research on 12/21/15.

## 2015-12-21 ENCOUNTER — Encounter: Payer: BLUE CROSS/BLUE SHIELD | Admitting: *Deleted

## 2015-12-21 ENCOUNTER — Encounter: Payer: Self-pay | Admitting: Hematology and Oncology

## 2015-12-21 ENCOUNTER — Ambulatory Visit (HOSPITAL_COMMUNITY)
Admission: RE | Admit: 2015-12-21 | Discharge: 2015-12-21 | Disposition: A | Discharge status: Home / Self Care | Payer: BLUE CROSS/BLUE SHIELD | Source: Ambulatory Visit | Attending: Obstetrics & Gynecology | Admitting: Obstetrics & Gynecology

## 2015-12-21 ENCOUNTER — Other Ambulatory Visit (HOSPITAL_BASED_OUTPATIENT_CLINIC_OR_DEPARTMENT_OTHER): Payer: BLUE CROSS/BLUE SHIELD

## 2015-12-21 VITALS — BP 126/71 | HR 79 | Temp 97.8°F | Resp 18 | Wt 277.3 lb

## 2015-12-21 DIAGNOSIS — Z8572 Personal history of non-Hodgkin lymphomas: Secondary | ICD-10-CM | POA: Diagnosis not present

## 2015-12-21 DIAGNOSIS — C8100 Nodular lymphocyte predominant Hodgkin lymphoma, unspecified site: Secondary | ICD-10-CM

## 2015-12-21 LAB — RESEARCH LABS

## 2015-12-21 NOTE — Progress Notes (Signed)
12/21/2015 1031 24 month PREVENT visit  The patient arrived to [place] alone. He had 24 month research labs drawn. The patient was then taken to research area where he had neurocog. testing done, administered by A. Marley, CRA.  The patient  then had the nurse visit.  His waist measurement was obtained twice and witnessed by this RN and A. Marley, CRA.  Waist measurement =44.5 inches.  The patient reported feeling well.  He stated he is walking every day as much as possible.   He has an ECOG of 0.  The patient denied peripheral edema.  The patient denied having any side effects from study drug and reiterated that he felt well.    The patient returned his study drug bottle.  The bottle was empty.  He stated he took his last dose of study drug on 12/20/15.  He stated he had not missed taking any doses of study drug.  He returned his study calendar for November 2017.    The patient's current medications were reviewed.  He denied having any medication changes or taking any new medications not on his current med list. He denied taking any other cholesterol lowering meds and said he had not received any heart protective meds.  The patient denied claustrophobia or any metal in his body.  He was taken to the clinic and his ht/wt and vital signs were obtained and were WNL.   The patient was reminded that he would receive a 30 day post-last dose of study drug follow-up call to assess for any side effects.   He was thanked for his commitment to the study.  He then proceeded to radiology for cardiac  MRI.  The patient's empty study drug bottle was returned to [employer], [place]. D, who verified empty study drug bottle.  Marcellus Scott, RN, BSN, MHA, OCN

## 2016-01-03 ENCOUNTER — Other Ambulatory Visit: Payer: Self-pay | Admitting: Dermatology

## 2016-01-19 ENCOUNTER — Telehealth: Payer: Self-pay | Admitting: *Deleted

## 2016-01-19 NOTE — Telephone Encounter (Signed)
01/19/2016 1506 PREVENT 30 day post last dose of study drug phone call  Spoke with the patient by phone. He took his last dose of study drug on 12/20/15.  He denied any changes since study visit on 12/21/15. He stated he continues to feel well and has not had any side effects including headache, muscle aches, or abdominal pain.  This RN informed the patient that the PREVENT study follow-up will now be officially over.  This RN thanked the patient for the time and  commitment he gave to research and to the PREVENT  Study.  He was without any questions.  Marcellus Scott, RN, BSN, MHA, OCN

## 2016-02-28 ENCOUNTER — Encounter: Payer: Self-pay | Admitting: Family Medicine

## 2016-02-28 ENCOUNTER — Ambulatory Visit (INDEPENDENT_AMBULATORY_CARE_PROVIDER_SITE_OTHER): Payer: BLUE CROSS/BLUE SHIELD | Admitting: Family Medicine

## 2016-02-28 VITALS — BP 112/74 | Ht 72.0 in | Wt 272.0 lb

## 2016-02-28 DIAGNOSIS — I10 Essential (primary) hypertension: Secondary | ICD-10-CM

## 2016-02-28 DIAGNOSIS — N5201 Erectile dysfunction due to arterial insufficiency: Secondary | ICD-10-CM | POA: Diagnosis not present

## 2016-02-28 DIAGNOSIS — Z23 Encounter for immunization: Secondary | ICD-10-CM | POA: Diagnosis not present

## 2016-02-28 MED ORDER — ENALAPRIL MALEATE 20 MG PO TABS: 20.0000 mg | ORAL_TABLET | Freq: Every morning | ORAL | 1 refills | Status: DC

## 2016-02-28 MED ORDER — SILDENAFIL CITRATE 20 MG PO TABS: ORAL_TABLET | ORAL | 0 refills | Status: DC

## 2016-02-28 NOTE — Progress Notes (Signed)
   Subjective:    Patient ID: Jared Bentley, male    DOB: 12-26-1955, 61 y.o.   MRN: [id number]  Hypertension  This is a chronic problem. The current episode started more than 1 year ago. Treatments tried: enalapril. There are no compliance problems (walks for exercise, eats health conscious).    exercisin refulaly, walking a t least a mile daily, injured knee, ot a cortisone shotm helped a bit, took couble aleave recently an d has helped  Has lost 15 pounds, working on diet these days, but doin long term  BP not ck ed elsewhere    Pt states no concerns. Wants pneumonia vaccine today.   Review of Systems No headache, no major weight loss or weight gain, no chest pain no back pain abdominal pain no change in bowel habits complete ROS otherwise negative     Objective:   Physical Exam  Alert vitals stable, NAD. Blood pressure good on repeat. HEENT normal. Lungs clear. Heart regular rate and rhythm.   Impression 1 hypertension good good control discussed #2 vaccinations discussion. Patient's oncologist advised him not to take shingles vaccine. Patient requests Pneumovax 23. #3 erectile dysfunction. Ongoing challenges. Patient would like to try generic medication. #4 status post intervention for lymphoma. Discussion held. Currently in remission. Diet exercise weight loss discussed. Compliance meds discussed. Recheck in 6 months. For wellness plus hypertension follow-up. 25 minutes spent most in discussion      Assessment & Plan:

## 2016-02-29 ENCOUNTER — Ambulatory Visit: Payer: BLUE CROSS/BLUE SHIELD | Admitting: Family Medicine

## 2016-03-05 ENCOUNTER — Telehealth: Payer: Self-pay | Admitting: Family Medicine

## 2016-03-05 MED ORDER — SILDENAFIL CITRATE 20 MG PO TABS: ORAL_TABLET | ORAL | 0 refills | Status: DC

## 2016-03-05 NOTE — Telephone Encounter (Signed)
Pt called to ask if the Rx that was written for him for 20 pills of Viagra (generic) could be rewritten for 50 pills. He states that [place], [place] will be cheaper at 50 pills than 20.

## 2016-03-05 NOTE — Addendum Note (Signed)
Addended by: Jesusita Oka on: 03/05/2016 11:45 AM   Modules accepted: Orders

## 2016-03-05 NOTE — Telephone Encounter (Signed)
Notified patient prescription was sent to [facility].

## 2016-03-05 NOTE — Telephone Encounter (Signed)
Lets do it

## 2016-03-12 ENCOUNTER — Telehealth: Payer: Self-pay | Admitting: *Deleted

## 2016-03-12 NOTE — Telephone Encounter (Signed)
Fax from [facility] coverage for sildenafil was denied. Copy of letter scanned into chart. Pt notified he needs to pay out of pocket if he wants. Pt states he will pay out of pocket. [facility] notified also.

## 2016-04-30 ENCOUNTER — Encounter (HOSPITAL_COMMUNITY): Payer: Self-pay

## 2016-04-30 ENCOUNTER — Other Ambulatory Visit (HOSPITAL_BASED_OUTPATIENT_CLINIC_OR_DEPARTMENT_OTHER): Payer: BLUE CROSS/BLUE SHIELD

## 2016-04-30 ENCOUNTER — Ambulatory Visit (HOSPITAL_COMMUNITY)
Admission: RE | Admit: 2016-04-30 | Discharge: 2016-04-30 | Disposition: A | Discharge status: Home / Self Care | Payer: BLUE CROSS/BLUE SHIELD | Source: Ambulatory Visit | Attending: Hematology and Oncology | Admitting: Hematology and Oncology

## 2016-04-30 DIAGNOSIS — Z8572 Personal history of non-Hodgkin lymphomas: Secondary | ICD-10-CM

## 2016-04-30 DIAGNOSIS — K760 Fatty (change of) liver, not elsewhere classified: Secondary | ICD-10-CM | POA: Insufficient documentation

## 2016-04-30 DIAGNOSIS — I700 Atherosclerosis of aorta: Secondary | ICD-10-CM | POA: Insufficient documentation

## 2016-04-30 DIAGNOSIS — K8020 Calculus of gallbladder without cholecystitis without obstruction: Secondary | ICD-10-CM | POA: Diagnosis not present

## 2016-04-30 LAB — COMPREHENSIVE METABOLIC PANEL
ALBUMIN: 4 g/dL (ref 3.5–5.0)
ALK PHOS: 65 U/L (ref 40–150)
ALT: 45 U/L (ref 0–55)
AST: 30 U/L (ref 5–34)
Anion Gap: 9 mEq/L (ref 3–11)
BILIRUBIN TOTAL: 0.49 mg/dL (ref 0.20–1.20)
BUN: 16.7 mg/dL (ref 7.0–26.0)
CALCIUM: 10.3 mg/dL (ref 8.4–10.4)
CO2: 27 mEq/L (ref 22–29)
Chloride: 103 mEq/L (ref 98–109)
Creatinine: 1 mg/dL (ref 0.7–1.3)
EGFR: 82 mL/min/{1.73_m2} — AB (ref >90)
Glucose: 114 mg/dl (ref 70–140)
Potassium: 4.5 mEq/L (ref 3.5–5.1)
SODIUM: 139 meq/L (ref 136–145)
TOTAL PROTEIN: 7.4 g/dL (ref 6.4–8.3)

## 2016-04-30 LAB — CBC WITH DIFFERENTIAL/PLATELET
BASO%: 0.5 % (ref 0.0–2.0)
Basophils Absolute: 0 10*3/uL (ref 0.0–0.1)
EOS%: 3.3 % (ref 0.0–7.0)
Eosinophils Absolute: 0.2 10*3/uL (ref 0.0–0.5)
HCT: 44.5 % (ref 38.4–49.9)
HGB: 15.2 g/dL (ref 13.0–17.1)
LYMPH%: 21.5 % (ref 14.0–49.0)
MCH: 33.2 pg (ref 27.2–33.4)
MCHC: 34.3 g/dL (ref 32.0–36.0)
MCV: 96.8 fL (ref 79.3–98.0)
MONO#: 0.6 10*3/uL (ref 0.1–0.9)
MONO%: 10.5 % (ref 0.0–14.0)
NEUT#: 3.7 10*3/uL (ref 1.5–6.5)
NEUT%: 64.2 % (ref 39.0–75.0)
Platelets: 234 10*3/uL (ref 140–400)
RBC: 4.6 10*6/uL (ref 4.20–5.82)
RDW: 13 % (ref 11.0–14.6)
WBC: 5.8 10*3/uL (ref 4.0–10.3)
lymph#: 1.2 10*3/uL (ref 0.9–3.3)

## 2016-04-30 LAB — LACTATE DEHYDROGENASE: LDH: 152 U/L (ref 125–245)

## 2016-04-30 MED ORDER — IOPAMIDOL (ISOVUE-300) INJECTION 61%
INTRAVENOUS | Status: AC
  Filled 2016-04-30: qty 100

## 2016-04-30 MED ORDER — IOPAMIDOL (ISOVUE-300) INJECTION 61%
100.0000 mL | Freq: Once | INTRAVENOUS | Status: AC | PRN
  Administered 2016-04-30: 100 mL via INTRAVENOUS

## 2016-05-01 ENCOUNTER — Telehealth: Payer: Self-pay | Admitting: Hematology and Oncology

## 2016-05-01 ENCOUNTER — Encounter: Payer: Self-pay | Admitting: Hematology and Oncology

## 2016-05-01 ENCOUNTER — Ambulatory Visit (HOSPITAL_BASED_OUTPATIENT_CLINIC_OR_DEPARTMENT_OTHER): Payer: BLUE CROSS/BLUE SHIELD | Admitting: Hematology and Oncology

## 2016-05-01 DIAGNOSIS — Z8572 Personal history of non-Hodgkin lymphomas: Secondary | ICD-10-CM | POA: Diagnosis not present

## 2016-05-01 NOTE — Assessment & Plan Note (Signed)
Clinically, he had no signs of disease. PET CT scan from 2016 and CT from 04/30/16 showed complete remission. The patient will continue to be enrolled in clinical trial. I will see him back in 6 months with history, physical examination and blood work.  We reviewed current guidelines. There is no benefit for future surveillance imaging unless he has signs and symptoms to suggest disease recurrence

## 2016-05-01 NOTE — Telephone Encounter (Signed)
Gave patient AVS and calender per 05/01/2016 los. 6 month f/u

## 2016-05-01 NOTE — Assessment & Plan Note (Signed)
The patient was motivated to lose weight He has lost almost 20 pounds since the last time he was seen He feels healthier His blood pressure is lower His target weight is 250 pounds Recent CT scan showed fatty liver change but his liver enzymes are completely normal I continue to encourage him to continue his weight loss efforts

## 2016-05-01 NOTE — Progress Notes (Signed)
[facility] OFFICE PROGRESS NOTE  Patient Care Team: Mikey Kirschner, MD as PCP - General (Family Medicine) Fay Records, MD as Consulting Physician (Cardiology) Carola Frost, RN as Registered Nurse  SUMMARY OF ONCOLOGIC HISTORY: Oncology History   Diffuse large B cell lymphoma   Staging form: Lymphoid Neoplasms, AJCC 6th Edition     Clinical stage from 12/30/2013: Stage III - Signed by Heath Lark, MD on 01/08/2014     Pathologic: No stage assigned - Unsigned       History of B-cell lymphoma   12/14/2013 Imaging    CT scan of the abdomen and pelvis showed bulky left iliac chain and left inguinal adenopathy, highly worrisome for lymphoma.      12/22/2013 Surgery    He underwent excisional lymph node biopsy of the left inguinal region that confirm lymphoma diagnosis. No      12/22/2013 Pathology Results    Accession: [id number] pathology showed a high-grade follicular lymphoma with possibly foci of diffuse large B-cell lymphoma      01/04/2014 Bone Marrow Biopsy    BM biopsy was negative with normal cytogenetics      01/05/2014 Imaging    PET CT scan showed lymph nodes involvement of both sides of her diaphragm      01/06/2014 Surgery    He has port placed      01/07/2014 Imaging    ECHO showed preserved EF of 73%, mildly dilated atrium      01/12/2014 - 04/27/2014 Chemotherapy    He is received 6 cycles of R CHOP chemotherapy      02/02/2014 Adverse Reaction    Vincristine dose was reduced by 50% due to early signs of peripheral neuropathy      03/11/2014 Imaging    Repeat PET scan showed near complete response to treatment      06/07/2014 Imaging    PET scan showed complete response to treatment      04/30/2016 Imaging    CT: No acute findings. No evidence for residual or recurrent mass or adenopathy. 2. Aortic atherosclerosis 3. Hepatic steatosis 4. Gallstone.       INTERVAL HISTORY: Please see below for problem oriented charting. He returns  for further follow-up He has lost a lot of weight recently due to weight loss effort with dietary modification and increased physical activity Denies lymphadenopathy Denies recent infection  REVIEW OF SYSTEMS:   Constitutional: Denies fevers, chills or abnormal weight loss Eyes: Denies blurriness of vision Ears, nose, mouth, throat, and face: Denies mucositis or sore throat Respiratory: Denies cough, dyspnea or wheezes Cardiovascular: Denies palpitation, chest discomfort or lower extremity swelling Gastrointestinal:  Denies nausea, heartburn or change in bowel habits Skin: Denies abnormal skin rashes Lymphatics: Denies new lymphadenopathy or easy bruising Neurological:Denies numbness, tingling or new weaknesses Behavioral/Psych: Mood is stable, no new changes  All other systems were reviewed with the patient and are negative.  I have reviewed the past medical history, past surgical history, social history and family history with the patient and they are unchanged from previous note.  ALLERGIES:  has No Known Allergies.  MEDICATIONS:  Current Outpatient Prescriptions  Medication Sig Dispense Refill  . enalapril (VASOTEC) 20 MG tablet Take 1 tablet (20 mg total) by mouth every morning. 90 tablet 1  . sildenafil (REVATIO) 20 MG tablet Take 2 tablets po as needed for sex 50 tablet 0   No current facility-administered medications for this visit.     PHYSICAL EXAMINATION: ECOG  PERFORMANCE STATUS: 0 - Asymptomatic  Vitals:   05/01/16 0906  BP: 119/83  Pulse: 85  Resp: 19  Temp: 98.1 F (36.7 C)   Filed Weights   05/01/16 0906  Weight: 256 lb 12.8 oz (116.5 kg)    GENERAL:alert, no distress and comfortable SKIN: skin color, texture, turgor are normal, no rashes or significant lesions EYES: normal, Conjunctiva are pink and non-injected, sclera clear OROPHARYNX:no exudate, no erythema and lips, buccal mucosa, and tongue normal  NECK: supple, thyroid normal size, non-tender,  without nodularity LYMPH:  no palpable lymphadenopathy in the cervical, axillary or inguinal LUNGS: clear to auscultation and percussion with normal breathing effort HEART: regular rate & rhythm and no murmurs and no lower extremity edema ABDOMEN:abdomen soft, non-tender and normal bowel sounds Musculoskeletal:no cyanosis of digits and no clubbing  NEURO: alert & oriented x 3 with fluent speech, no focal motor/sensory deficits  LABORATORY DATA:  I have reviewed the data as listed    Component Value Date/Time   NA 139 04/30/2016 0754   K 4.5 04/30/2016 0754   CL 103 11/11/2014 0931   CO2 27 04/30/2016 0754   GLUCOSE 114 04/30/2016 0754   BUN 16.7 04/30/2016 0754   CREATININE 1.0 04/30/2016 0754   CALCIUM 10.3 04/30/2016 0754   PROT 7.4 04/30/2016 0754   ALBUMIN 4.0 04/30/2016 0754   AST 30 04/30/2016 0754   ALT 45 04/30/2016 0754   ALKPHOS 65 04/30/2016 0754   BILITOT 0.49 04/30/2016 0754   GFRNONAA >60 11/11/2014 0931   GFRAA >60 11/11/2014 0931    No results found for: SPEP, UPEP  Lab Results  Component Value Date   WBC 5.8 04/30/2016   NEUTROABS 3.7 04/30/2016   HGB 15.2 04/30/2016   HCT 44.5 04/30/2016   MCV 96.8 04/30/2016   PLT 234 04/30/2016      Chemistry      Component Value Date/Time   NA 139 04/30/2016 0754   K 4.5 04/30/2016 0754   CL 103 11/11/2014 0931   CO2 27 04/30/2016 0754   BUN 16.7 04/30/2016 0754   CREATININE 1.0 04/30/2016 0754      Component Value Date/Time   CALCIUM 10.3 04/30/2016 0754   ALKPHOS 65 04/30/2016 0754   AST 30 04/30/2016 0754   ALT 45 04/30/2016 0754   BILITOT 0.49 04/30/2016 0754       RADIOGRAPHIC STUDIES: I have personally reviewed the radiological images as listed and agreed with the findings in the report. Ct Abdomen Pelvis W Contrast  Result Date: 04/30/2016 CLINICAL DATA:  B-cell lymphoma.  Restaging EXAM: CT ABDOMEN AND PELVIS WITH CONTRAST TECHNIQUE: Multidetector CT imaging of the abdomen and pelvis was  performed using the standard protocol following bolus administration of intravenous contrast. CONTRAST:  ISOVUE-300 IOPAMIDOL (ISOVUE-300) INJECTION 61% COMPARISON:  06/07/2014 FINDINGS: Lower chest: Calcified granulomas identified within the right lower lobe. 4 mm nodule within the left lower lobe is noncalcified, image number 25 of series 4. Unchanged from previous exam. Hepatobiliary: Hepatic steatosis. There are 2 small low-attenuation structures which measure up to 4 mm, image 14 of series 2. Too small to reliably characterize. Small stone within the dependent portion of the gallbladder measuring 4 mm. No biliary dilatation. Pancreas: Unremarkable. No pancreatic ductal dilatation or surrounding inflammatory changes. Spleen: Normal in size without focal abnormality. Adrenals/Urinary Tract: Adrenal glands are unremarkable. Kidneys are normal, without renal calculi, focal lesion, or hydronephrosis. Bladder is unremarkable. Stomach/Bowel: Stomach is within normal limits. Appendix appears normal. No evidence  of bowel wall thickening, distention, or inflammatory changes. Vascular/Lymphatic: Aortic atherosclerosis. No enlarged upper abdominal lymph nodes. No pelvic or inguinal adenopathy identified. Reproductive: Prostate is unremarkable. Other: No abdominal wall hernia or abnormality. No abdominopelvic ascites. Musculoskeletal: Degenerative disc disease noted within the lower lumbar spine. IMPRESSION: 1. No acute findings. No evidence for residual or recurrent mass or adenopathy. 2. Aortic atherosclerosis 3. Hepatic steatosis 4. Gallstone. Electronically Signed   By: Kerby Moors M.D.   On: 04/30/2016 09:28    ASSESSMENT & PLAN:  History of B-cell lymphoma Clinically, he had no signs of disease. PET CT scan from 2016 and CT from 04/30/16 showed complete remission. The patient will continue to be enrolled in clinical trial. I will see him back in 6 months with history, physical examination and blood  work.  We reviewed current guidelines. There is no benefit for future surveillance imaging unless he has signs and symptoms to suggest disease recurrence  Morbid obesity due to excess calories ([place]) The patient was motivated to lose weight He has lost almost 20 pounds since the last time he was seen He feels healthier His blood pressure is lower His target weight is 250 pounds Recent CT scan showed fatty liver change but his liver enzymes are completely normal I continue to encourage him to continue his weight loss efforts   No orders of the defined types were placed in this encounter.  All questions were answered. The patient knows to call the clinic with any problems, questions or concerns. No barriers to learning was detected. I spent 15 minutes counseling the patient face to face. The total time spent in the appointment was 20 minutes and more than 50% was on counseling and review of test results     Heath Lark, MD 05/01/2016 10:37 AM

## 2016-05-17 ENCOUNTER — Other Ambulatory Visit: Payer: Self-pay | Admitting: Dermatology

## 2016-08-27 ENCOUNTER — Ambulatory Visit (INDEPENDENT_AMBULATORY_CARE_PROVIDER_SITE_OTHER): Payer: BLUE CROSS/BLUE SHIELD | Admitting: Family Medicine

## 2016-08-27 ENCOUNTER — Encounter: Payer: Self-pay | Admitting: Family Medicine

## 2016-08-27 VITALS — BP 108/62 | Ht 72.0 in | Wt 253.4 lb

## 2016-08-27 DIAGNOSIS — I10 Essential (primary) hypertension: Secondary | ICD-10-CM | POA: Diagnosis not present

## 2016-08-27 DIAGNOSIS — Z13220 Encounter for screening for lipoid disorders: Secondary | ICD-10-CM | POA: Diagnosis not present

## 2016-08-27 DIAGNOSIS — Z0000 Encounter for general adult medical examination without abnormal findings: Secondary | ICD-10-CM | POA: Diagnosis not present

## 2016-08-27 DIAGNOSIS — N5201 Erectile dysfunction due to arterial insufficiency: Secondary | ICD-10-CM

## 2016-08-27 MED ORDER — ENALAPRIL MALEATE 20 MG PO TABS
20.0000 mg | ORAL_TABLET | Freq: Every morning | ORAL | 1 refills | Status: DC
Start: 2016-08-27 — End: 2017-02-27

## 2016-08-27 MED ORDER — TRIAMCINOLONE ACETONIDE 0.1 % EX CREA: 1.0000 "application " | TOPICAL_CREAM | Freq: Two times a day (BID) | CUTANEOUS | 0 refills | Status: DC

## 2016-08-27 NOTE — Progress Notes (Signed)
   Subjective:    Patient ID: Jared Bentley, male    DOB: Dec 10, 1955, 61 y.o.   MRN: [id number]  Hypertension  This is a chronic problem. The current episode started more than 1 year ago. Pertinent negatives include no chest pain, headaches or neck pain. Risk factors for coronary artery disease include male gender. Treatments tried: vasotec. There are no compliance problems.    Blood pressure medicine and blood pressure levels reviewed today with patient. Compliant with blood pressure medicine. States does not miss a dose. No obvious side effects. Blood pressure generally good when checked elsewhere. Watching salt intake.  Working on exercie still walking, mile per day, saying active [place] the farm staying buy, need to work on [employer] fairly reonable, holding wait down, ocmpared to before  Till on low cb diet  BP not cked elsewhere  Next colon due in this fall, fr dr Luretha Rued, will be ten yrs out  The patient comes in today for a wellness visit.    A review of their health history was completed.  A review of medications was also completed.  Any needed refills; yes  Eating habits:   Falls/  MVA accidents in past few months: none  Regular exercise:   Specialist pt sees on regular basis:   Preventative health issues were discussed.   Additional concerns:    Review of Systems  Constitutional: Negative for activity change, appetite change and fever.  HENT: Negative for congestion and rhinorrhea.   Eyes: Negative for discharge.  Respiratory: Negative for cough and wheezing.   Cardiovascular: Negative for chest pain.  Gastrointestinal: Negative for abdominal pain, blood in stool and vomiting.  Genitourinary: Negative for difficulty urinating and frequency.  Musculoskeletal: Negative for neck pain.  Skin: Negative for rash.  Allergic/Immunologic: Negative for environmental allergies and food allergies.  Neurological: Negative for weakness and headaches.    Psychiatric/Behavioral: Negative for agitation.  All other systems reviewed and are negative.      Objective:   Physical Exam  Constitutional: He appears well-developed and well-nourished.  HENT:  Head: Normocephalic and atraumatic.  Right Ear: External ear normal.  Left Ear: External ear normal.  Nose: Nose normal.  Mouth/Throat: Oropharynx is clear and moist.  Eyes: Pupils are equal, round, and reactive to light. EOM are normal.  Neck: Normal range of motion. Neck supple. No thyromegaly present.  Cardiovascular: Normal rate, regular rhythm and normal heart sounds.   No murmur heard. Pulmonary/Chest: Effort normal and breath sounds normal. No respiratory distress. He has no wheezes.  Abdominal: Soft. Bowel sounds are normal. He exhibits no distension and no mass. There is no tenderness.  Genitourinary: Penis normal.  Musculoskeletal: Normal range of motion. He exhibits no edema.  Lymphadenopathy:    He has no cervical adenopathy.  Neurological: He is alert. He exhibits normal muscle tone.  Skin: Skin is warm and dry. No erythema.  Psychiatric: He has a normal mood and affect. His behavior is normal. Judgment normal.  Vitals reviewed.         Assessment & Plan:  Impression 1 wellness exam. Diet discussed exercise discussed #2 hypertension good control discussed maintain same meds #3 COPD #4 chronic lymphoma discussed in remission #5 erectile dysfunction uses medication. Planned diet discuss exercise discussed. Appropriate blood work. Maintain same dose of blood pressure medication. Vaccines discussed

## 2016-08-29 ENCOUNTER — Other Ambulatory Visit: Payer: Self-pay | Admitting: Family Medicine

## 2016-08-29 LAB — LIPID PANEL
CHOL/HDL RATIO: 2.3 ratio (ref <5.0)
CHOLESTEROL: 178 mg/dL (ref <200)
HDL: 77 mg/dL (ref >40)
LDL Cholesterol: 91 mg/dL (ref <100)
TRIGLYCERIDES: 50 mg/dL (ref <150)
VLDL: 10 mg/dL (ref <30)

## 2016-08-30 LAB — PSA: PSA: 0.8 ng/mL (ref <=4.0)

## 2016-10-17 ENCOUNTER — Telehealth: Payer: Self-pay | Admitting: Internal Medicine

## 2016-10-17 NOTE — Telephone Encounter (Signed)
Letter mailed to pt.  

## 2016-10-17 NOTE — Telephone Encounter (Signed)
Recall for tcs °

## 2016-10-22 ENCOUNTER — Telehealth: Payer: Self-pay | Admitting: Internal Medicine

## 2016-10-22 NOTE — Telephone Encounter (Signed)
[phone]  PATIENT CALLED TO SET UP 10 YR TCS.  NO CURRENT GI PROBLEMS AND NO HEART ATTACK OR ON BLOOD THINNERS.

## 2016-10-24 ENCOUNTER — Telehealth: Payer: Self-pay

## 2016-10-24 NOTE — Telephone Encounter (Signed)
[phone] patient received letter to schedule tcs

## 2016-10-25 ENCOUNTER — Telehealth: Payer: Self-pay

## 2016-10-25 NOTE — Telephone Encounter (Signed)
See separate note.

## 2016-10-25 NOTE — Telephone Encounter (Signed)
Pt has appt

## 2016-10-25 NOTE — Telephone Encounter (Signed)
Pt has OV appt with Walden Field, NP on 12/10/2016 at 8:30 Am due to alcohol ( drinks about 6 beers a day).

## 2016-11-01 ENCOUNTER — Other Ambulatory Visit: Payer: Self-pay | Admitting: Hematology and Oncology

## 2016-11-01 ENCOUNTER — Ambulatory Visit (HOSPITAL_BASED_OUTPATIENT_CLINIC_OR_DEPARTMENT_OTHER): Payer: BLUE CROSS/BLUE SHIELD | Admitting: Hematology and Oncology

## 2016-11-01 ENCOUNTER — Other Ambulatory Visit (HOSPITAL_BASED_OUTPATIENT_CLINIC_OR_DEPARTMENT_OTHER): Payer: BLUE CROSS/BLUE SHIELD

## 2016-11-01 ENCOUNTER — Telehealth: Payer: Self-pay | Admitting: Hematology and Oncology

## 2016-11-01 DIAGNOSIS — Z8572 Personal history of non-Hodgkin lymphomas: Secondary | ICD-10-CM

## 2016-11-01 DIAGNOSIS — F1020 Alcohol dependence, uncomplicated: Secondary | ICD-10-CM | POA: Diagnosis not present

## 2016-11-01 LAB — COMPREHENSIVE METABOLIC PANEL
ALBUMIN: 4 g/dL (ref 3.5–5.0)
ALT: 65 U/L — ABNORMAL HIGH (ref 0–55)
AST: 41 U/L — AB (ref 5–34)
Alkaline Phosphatase: 58 U/L (ref 40–150)
Anion Gap: 10 mEq/L (ref 3–11)
BUN: 20.4 mg/dL (ref 7.0–26.0)
CHLORIDE: 102 meq/L (ref 98–109)
CO2: 27 meq/L (ref 22–29)
Calcium: 10.2 mg/dL (ref 8.4–10.4)
Creatinine: 1 mg/dL (ref 0.7–1.3)
EGFR: 78 mL/min/{1.73_m2} — AB (ref >90)
GLUCOSE: 103 mg/dL (ref 70–140)
POTASSIUM: 4.3 meq/L (ref 3.5–5.1)
SODIUM: 140 meq/L (ref 136–145)
Total Bilirubin: 0.79 mg/dL (ref 0.20–1.20)
Total Protein: 7.4 g/dL (ref 6.4–8.3)

## 2016-11-01 LAB — CBC WITH DIFFERENTIAL/PLATELET
BASO%: 0.3 % (ref 0.0–2.0)
Basophils Absolute: 0 10*3/uL (ref 0.0–0.1)
EOS%: 2.9 % (ref 0.0–7.0)
Eosinophils Absolute: 0.2 10*3/uL (ref 0.0–0.5)
HCT: 45.5 % (ref 38.4–49.9)
HEMOGLOBIN: 15.4 g/dL (ref 13.0–17.1)
LYMPH%: 26.5 % (ref 14.0–49.0)
MCH: 34.1 pg — AB (ref 27.2–33.4)
MCHC: 33.8 g/dL (ref 32.0–36.0)
MCV: 100.7 fL — ABNORMAL HIGH (ref 79.3–98.0)
MONO#: 0.7 10*3/uL (ref 0.1–0.9)
MONO%: 12 % (ref 0.0–14.0)
NEUT#: 3.5 10*3/uL (ref 1.5–6.5)
NEUT%: 58.3 % (ref 39.0–75.0)
Platelets: 191 10*3/uL (ref 140–400)
RBC: 4.52 10*6/uL (ref 4.20–5.82)
RDW: 12.8 % (ref 11.0–14.6)
WBC: 5.9 10*3/uL (ref 4.0–10.3)
lymph#: 1.6 10*3/uL (ref 0.9–3.3)

## 2016-11-01 NOTE — Telephone Encounter (Signed)
Scheduled appt per 9/27 los - Gave patient AVS and calender per los.  

## 2016-11-02 ENCOUNTER — Encounter: Payer: Self-pay | Admitting: Hematology and Oncology

## 2016-11-02 NOTE — Progress Notes (Signed)
[facility] OFFICE PROGRESS NOTE  Patient Care Team: Merlyn Albert, MD as PCP - General (Family Medicine) Pricilla Riffle, MD as Consulting Physician (Cardiology) Cira Rue, RN as Registered Nurse  SUMMARY OF ONCOLOGIC HISTORY: Oncology History   Diffuse large B cell lymphoma   Staging form: Lymphoid Neoplasms, AJCC 6th Edition     Clinical stage from 12/30/2013: Stage III - Signed by Artis Delay, MD on 01/08/2014     Pathologic: No stage assigned - Unsigned       History of B-cell lymphoma   12/14/2013 Imaging    CT scan of the abdomen and pelvis showed bulky left iliac chain and left inguinal adenopathy, highly worrisome for lymphoma.      12/22/2013 Surgery    He underwent excisional lymph node biopsy of the left inguinal region that confirm lymphoma diagnosis. No      12/22/2013 Pathology Results    Accession: [id number] pathology showed a high-grade follicular lymphoma with possibly foci of diffuse large B-cell lymphoma      01/04/2014 Bone Marrow Biopsy    BM biopsy was negative with normal cytogenetics      01/05/2014 Imaging    PET CT scan showed lymph nodes involvement of both sides of her diaphragm      01/06/2014 Surgery    He has port placed      01/07/2014 Imaging    ECHO showed preserved EF of 73%, mildly dilated atrium      01/12/2014 - 04/27/2014 Chemotherapy    He is received 6 cycles of R CHOP chemotherapy      02/02/2014 Adverse Reaction    Vincristine dose was reduced by 50% due to early signs of peripheral neuropathy      03/11/2014 Imaging    Repeat PET scan showed near complete response to treatment      06/07/2014 Imaging    PET scan showed complete response to treatment      04/30/2016 Imaging    CT: No acute findings. No evidence for residual or recurrent mass or adenopathy. 2. Aortic atherosclerosis 3. Hepatic steatosis 4. Gallstone.       INTERVAL HISTORY: Please see below for problem oriented charting. He  returns for further follow-up No new lymphadenopathy No recent infection The patient has gained some weight and then lost weight He is still morbidly obese He denies excessive alcohol intake  REVIEW OF SYSTEMS:   Constitutional: Denies fevers, chills or abnormal weight loss Eyes: Denies blurriness of vision Ears, nose, mouth, throat, and face: Denies mucositis or sore throat Respiratory: Denies cough, dyspnea or wheezes Cardiovascular: Denies palpitation, chest discomfort or lower extremity swelling Gastrointestinal:  Denies nausea, heartburn or change in bowel habits Skin: Denies abnormal skin rashes Lymphatics: Denies new lymphadenopathy or easy bruising Neurological:Denies numbness, tingling or new weaknesses Behavioral/Psych: Mood is stable, no new changes  All other systems were reviewed with the patient and are negative.  I have reviewed the past medical history, past surgical history, social history and family history with the patient and they are unchanged from previous note.  ALLERGIES:  has No Known Allergies.  MEDICATIONS:  Current Outpatient Prescriptions  Medication Sig Dispense Refill  . enalapril (VASOTEC) 20 MG tablet Take 1 tablet (20 mg total) by mouth every morning. 90 tablet 1  . sildenafil (REVATIO) 20 MG tablet Take 2 tablets po as needed for sex 50 tablet 0  . triamcinolone cream (KENALOG) 0.1 % Apply 1 application topically 2 (two) times daily.  60 g 0   No current facility-administered medications for this visit.     PHYSICAL EXAMINATION: ECOG PERFORMANCE STATUS: 1 - Symptomatic but completely ambulatory  Vitals:   11/01/16 0812  BP: 102/67  Pulse: 71  Resp: 20  Temp: 98.5 F (36.9 C)  SpO2: 97%   Filed Weights   11/01/16 0812  Weight: 246 lb 12.8 oz (111.9 kg)    GENERAL:alert, no distress and comfortable.  He is morbidly obese SKIN: skin color, texture, turgor are normal, no rashes or significant lesions EYES: normal, Conjunctiva are pink  and non-injected, sclera clear OROPHARYNX:no exudate, no erythema and lips, buccal mucosa, and tongue normal  NECK: supple, thyroid normal size, non-tender, without nodularity LYMPH:  no palpable lymphadenopathy in the cervical, axillary or inguinal LUNGS: clear to auscultation and percussion with normal breathing effort HEART: regular rate & rhythm and no murmurs and no lower extremity edema ABDOMEN:abdomen soft, non-tender and normal bowel sounds Musculoskeletal:no cyanosis of digits and no clubbing  NEURO: alert & oriented x 3 with fluent speech, no focal motor/sensory deficits  LABORATORY DATA:  I have reviewed the data as listed    Component Value Date/Time   NA 140 11/01/2016 0749   K 4.3 11/01/2016 0749   CL 103 11/11/2014 0931   CO2 27 11/01/2016 0749   GLUCOSE 103 11/01/2016 0749   BUN 20.4 11/01/2016 0749   CREATININE 1.0 11/01/2016 0749   CALCIUM 10.2 11/01/2016 0749   PROT 7.4 11/01/2016 0749   ALBUMIN 4.0 11/01/2016 0749   AST 41 (H) 11/01/2016 0749   ALT 65 (H) 11/01/2016 0749   ALKPHOS 58 11/01/2016 0749   BILITOT 0.79 11/01/2016 0749   GFRNONAA >60 11/11/2014 0931   GFRAA >60 11/11/2014 0931    No results found for: SPEP, UPEP  Lab Results  Component Value Date   WBC 5.9 11/01/2016   NEUTROABS 3.5 11/01/2016   HGB 15.4 11/01/2016   HCT 45.5 11/01/2016   MCV 100.7 (H) 11/01/2016   PLT 191 11/01/2016      Chemistry      Component Value Date/Time   NA 140 11/01/2016 0749   K 4.3 11/01/2016 0749   CL 103 11/11/2014 0931   CO2 27 11/01/2016 0749   BUN 20.4 11/01/2016 0749   CREATININE 1.0 11/01/2016 0749      Component Value Date/Time   CALCIUM 10.2 11/01/2016 0749   ALKPHOS 58 11/01/2016 0749   AST 41 (H) 11/01/2016 0749   ALT 65 (H) 11/01/2016 0749   BILITOT 0.79 11/01/2016 0749      ASSESSMENT & PLAN:  History of B-cell lymphoma Clinically, he had no signs of disease. PET CT scan from 2016 and CT from 04/30/16 showed complete  remission. I will see him back in 6 months with history, physical examination and blood work.  We reviewed current guidelines. There is no benefit for future surveillance imaging unless he has signs and symptoms to suggest disease recurrence  Morbid obesity due to excess calories ([place]) The patient was motivated to lose weight Recent CT scan showed fatty liver change and his liver enzymes are abnormal I continue to encourage him to continue his weight loss efforts  Chronic alcoholism The patient is recommended to quit alcohol intake  He will try to quit I suspect a recent ingestion of alcohol as a cause of mild macrocytosis and elevated liver enzymes. I recommend observation only for now    No orders of the defined types were placed in this encounter.  All questions were answered. The patient knows to call the clinic with any problems, questions or concerns. No barriers to learning was detected. I spent 1 minutes counseling the patient face to face. The total time spent in the appointment was 15 minutes and more than 50% was on counseling and review of test results     Heath Lark, MD 11/02/2016 9:20 AM

## 2016-11-02 NOTE — Assessment & Plan Note (Signed)
The patient is recommended to quit alcohol intake  He will try to quit I suspect a recent ingestion of alcohol as a cause of mild macrocytosis and elevated liver enzymes. I recommend observation only for now

## 2016-11-02 NOTE — Assessment & Plan Note (Signed)
The patient was motivated to lose weight Recent CT scan showed fatty liver change and his liver enzymes are abnormal I continue to encourage him to continue his weight loss efforts

## 2016-11-02 NOTE — Assessment & Plan Note (Signed)
Clinically, he had no signs of disease. PET CT scan from 2016 and CT from 04/30/16 showed complete remission. I will see him back in 6 months with history, physical examination and blood work.  We reviewed current guidelines. There is no benefit for future surveillance imaging unless he has signs and symptoms to suggest disease recurrence

## 2016-12-10 ENCOUNTER — Ambulatory Visit: Payer: BLUE CROSS/BLUE SHIELD | Admitting: Nurse Practitioner

## 2016-12-10 ENCOUNTER — Other Ambulatory Visit: Payer: Self-pay | Admitting: *Deleted

## 2016-12-10 ENCOUNTER — Encounter: Payer: Self-pay | Admitting: Nurse Practitioner

## 2016-12-10 ENCOUNTER — Encounter: Payer: Self-pay | Admitting: *Deleted

## 2016-12-10 VITALS — BP 118/72 | HR 70 | Temp 97.6°F | Ht 72.0 in | Wt 254.0 lb

## 2016-12-10 DIAGNOSIS — F1020 Alcohol dependence, uncomplicated: Secondary | ICD-10-CM | POA: Diagnosis not present

## 2016-12-10 DIAGNOSIS — Z860100 Personal history of colon polyps, unspecified: Secondary | ICD-10-CM | POA: Insufficient documentation

## 2016-12-10 DIAGNOSIS — K625 Hemorrhage of anus and rectum: Secondary | ICD-10-CM | POA: Diagnosis not present

## 2016-12-10 MED ORDER — PEG 3350-KCL-NA BICARB-NACL 420 G PO SOLR: 4000.0000 mL | Freq: Once | ORAL | 0 refills | Status: AC

## 2016-12-10 NOTE — Patient Instructions (Signed)
1. Your colonoscopy for you. 2. Further recommendations will be made based on the results of your colonoscopy. 3. Return for follow-up based on recommendations made after your colonoscopy. 4. Call us if you have any questions or concerns.

## 2016-12-10 NOTE — Assessment & Plan Note (Signed)
The patient notes occasional rectal bleeding.  This is typically when he is constipated and has to strain.  Bleeding is generally toilet tissue hematochezia and not overt bleeding.  Does not happen when he is not having a bowel movement.  He does have a history of hemorrhoids.  Likely benign anorectal source, but cannot rule out more insidious pathology.  He is due for potential colonoscopy at this time regardless.  See below for further recommendations.  If he does have hemorrhoids on exam, we can send in Anusol if you would like.

## 2016-12-10 NOTE — Progress Notes (Signed)
Primary Care Physician:  Mikey Kirschner, MD Primary Gastroenterologist:  Dr. Gala Romney  Chief Complaint  Patient presents with  . Colonoscopy    rectal bleeding 1 month ago    HPI:   Jared Bentley is a 61 y.o. male who presents to schedule a 10-year colonoscopy.  She was brought into the office due to chronic history of alcohol use in order to assess the risks and benefits as well as consider possible need for augmented sedation.    Last colonoscopy 11/28/2006 which found a single diminutive polyp.  Also noted hemorrhoids.  Polyp was found to be hyperplastic on surgical pathology.  Recommended 10-year repeat exam.  Today she states he's doing well overall. Last colonoscopy about 10 years ago at [facility]. Denies abdominal pain, N/V. Occasionally has toilet tissue hematochezia. Denies melena. Denies hemorrhoid symptoms (itching, irritation, fullness.) Bleeding only with bowel movements and if he gets constipated. Happens rarely. Denies fever, chills, unintentional weight loss. Denies chest pain, dyspnea, dizziness, lightheadedness, syncope, near syncope. Denies any other upper or lower GI symptoms.  Please note: system upgrade and PMH/PSH is not fulling importing. Also has a past surgical history of: port-a-cath placement, port-a-cath removal, lymph node biopsy, Left and right carpel tunnel release.  Past Medical History:  Diagnosis Date  . Anxiety   . Carpal tunnel syndrome   . Chronic back pain   . Diffuse large B cell lymphoma ([place]) 12/30/2013  . ED (erectile dysfunction)   . Glucose intolerance (impaired glucose tolerance)   . Heavy alcohol consumption    6-12 beers daily  . Hypertension   . Sleep apnea    severe OSA-uses a cpap    Past Surgical History:  Procedure Laterality Date  . COLONOSCOPY W/ POLYPECTOMY    . VASECTOMY    . WISDOM TOOTH EXTRACTION      Current Outpatient Medications  Medication Sig Dispense Refill  . enalapril (VASOTEC) 20 MG tablet Take 1  tablet (20 mg total) by mouth every morning. 90 tablet 1  . sildenafil (REVATIO) 20 MG tablet Take 2 tablets po as needed for sex 50 tablet 0  . triamcinolone cream (KENALOG) 0.1 % Apply 1 application topically 2 (two) times daily. (Patient not taking: Reported on 12/10/2016) 60 g 0   No current facility-administered medications for this visit.     Allergies as of 12/10/2016  . (No Known Allergies)    Family History  Problem Relation Age of Onset  . Cancer Father        bone  . Cancer Mother        tongue ca  . Colon cancer Neg Hx     Social History   Socioeconomic History  . Marital status: Married    Spouse name: Not on file  . Number of children: 1  . Years of education: Not on file  . Highest education level: Not on file  Social Needs  . Financial resource strain: Not on file  . Food insecurity - worry: Not on file  . Food insecurity - inability: Not on file  . Transportation needs - medical: Not on file  . Transportation needs - non-medical: Not on file  Occupational History  . Not on file  Tobacco Use  . Smoking status: Former Smoker    Packs/day: 1.00    Years: 30.00    Pack years: 30.00    Last attempt to quit: 05/14/2004    Years since quitting: 12.5  . Smokeless tobacco: Never  Used  Substance and Sexual Activity  . Alcohol use: Yes    Comment: daily-6-12 beer  . Drug use: No  . Sexual activity: Yes    Birth control/protection: None  Other Topics Concern  . Not on file  Social History Narrative   Retired.  [place].      Review of Systems: General: Negative for anorexia, weight loss, fever, chills, fatigue, weakness. Eyes: Negative for vision changes.  ENT: Negative for hoarseness, difficulty swallowing , nasal congestion. CV: Negative for chest pain, angina, palpitations, dyspnea on exertion, peripheral edema.  Respiratory: Negative for dyspnea at rest, dyspnea on exertion, cough, sputum, wheezing.  GI: See history of present  illness. GU:  Negative for dysuria, hematuria, urinary incontinence, urinary frequency, nocturnal urination.  MS: Negative for joint pain, low back pain.  Derm: Negative for rash or itching.  Neuro: Negative for weakness, abnormal sensation, seizure, frequent headaches, memory loss, confusion.  Psych: Negative for anxiety, depression, suicidal ideation, hallucinations.  Endo: Negative for unusual weight change.  Heme: Negative for bruising or bleeding. Allergy: Negative for rash or hives.    Physical Exam: BP 118/72   Pulse 70   Temp 97.6 F (36.4 C) (Oral)   Ht 6' (1.829 m)   Wt 254 lb (115.2 kg)   BMI 34.45 kg/m  General:   Alert and oriented. Pleasant and cooperative. Well-nourished and well-developed.  Head:  Normocephalic and atraumatic. Eyes:  Without icterus, sclera clear and conjunctiva pink.  Ears:  Normal auditory acuity. Mouth:  No deformity or lesions, oral mucosa pink.  Throat/Neck:  Supple, without mass or thyromegaly. Cardiovascular:  S1, S2 present without murmurs appreciated. Normal pulses noted. Extremities without clubbing or edema. Respiratory:  Clear to auscultation bilaterally. No wheezes, rales, or rhonchi. No distress.  Gastrointestinal:  +BS, soft, non-tender and non-distended. No HSM noted. No guarding or rebound. No masses appreciated.  Rectal:  Deferred  Musculoskalatal:  Symmetrical without gross deformities. Normal posture. Skin:  Intact without significant lesions or rashes. Neurologic:  Alert and oriented x4;  grossly normal neurologically. Psych:  Alert and cooperative. Normal mood and affect. Heme/Lymph/Immune: No significant cervical adenopathy. No excessive bruising noted.    12/10/2016 8:46 AM   Disclaimer: This note was dictated with voice recognition software. Similar sounding words can inadvertently be transcribed and may not be corrected upon review.

## 2016-12-10 NOTE — Assessment & Plan Note (Signed)
Patient has a personal history of colon polyps with a single polyp found in 2008 that was noted to be hyperplastic on surgical pathology.  He was recommended to have a 10-year repeat exam.  As all colonoscopies are benefit of risks and benefits he was brought into our office to further assess these given his history of alcoholism.  At this point we can proceed with a follow-up colonoscopy given appropriate risks for the benefits he would obtain.  We will need to plan for propofol/MAC as augmented sedation due to his other diagnoses.  Proceed with TCS propofol/MAC with Dr. Gala Romney in near future: the risks, benefits, and alternatives have been discussed with the patient in detail. The patient states understanding and desires to proceed.  The patient is not on any anticoagulants, anxiolytics, chronic pain medications, or antidepressants.  He denies drug use.  He does drink 6-12 beers a day.  We will plan for propofol/MAC to promote adequate sedation.

## 2016-12-10 NOTE — Assessment & Plan Note (Signed)
History of alcoholism with 6-12 beers a day.  This does potentially affect his risks and benefits.  It does also necessitate augmented sedation.  Due to this we will plan for propofol/MAC.

## 2016-12-11 ENCOUNTER — Telehealth: Payer: Self-pay | Admitting: *Deleted

## 2016-12-11 NOTE — Progress Notes (Signed)
CC'ED TO PCP 

## 2016-12-11 NOTE — Telephone Encounter (Signed)
Called patient plan [place] at [phone]. Spoke with Merry Proud and was advised no precert was required for pt TCS.

## 2017-01-15 NOTE — Patient Instructions (Signed)
Jared Bentley  01/15/2017     @PREFPERIOPPHARMACY @   Your procedure is scheduled on  01/23/2017  Report to [facility] at  1000  A.M.  Call this number if you have problems the morning of surgery:  [phone]   Remember:  Do not eat food or drink liquids after midnight.  Take these medicines the morning of surgery with A SIP OF WATER  vasotec.   Do not wear jewelry, make-up or nail polish.  Do not wear lotions, powders, or perfumes, or deodorant.  Do not shave 48 hours prior to surgery.  Men may shave face and neck.  Do not bring valuables to the hospital.  [facility] is not responsible for any belongings or valuables.  Contacts, dentures or bridgework may not be worn into surgery.  Leave your suitcase in the car.  After surgery it may be brought to your room.  For patients admitted to the hospital, discharge time will be determined by your treatment team.  Patients discharged the day of surgery will not be allowed to drive home.   Name and phone number of your driver:   family Special instructions:  Follow the diet and prep instructions given to you by Dr Roseanne Kaufman office.  Please read over the following fact sheets that you were given. Anesthesia Post-op Instructions and Care and Recovery After Surgery       Colonoscopy, Adult A colonoscopy is an exam to look at the entire large intestine. During the exam, a lubricated, bendable tube is inserted into the anus and then passed into the rectum, colon, and other parts of the large intestine. A colonoscopy is often done as a part of normal colorectal screening or in response to certain symptoms, such as anemia, persistent diarrhea, abdominal pain, and blood in the stool. The exam can help screen for and diagnose medical problems, including:  Tumors.  Polyps.  Inflammation.  Areas of bleeding.  Tell a health care provider about:  Any allergies you have.  All medicines you are taking, including  vitamins, herbs, eye drops, creams, and over-the-counter medicines.  Any problems you or family members have had with anesthetic medicines.  Any blood disorders you have.  Any surgeries you have had.  Any medical conditions you have.  Any problems you have had passing stool. What are the risks? Generally, this is a safe procedure. However, problems may occur, including:  Bleeding.  A tear in the intestine.  A reaction to medicines given during the exam.  Infection (rare).  What happens before the procedure? Eating and drinking restrictions Follow instructions from your health care provider about eating and drinking, which may include:  A few days before the procedure - follow a low-fiber diet. Avoid nuts, seeds, dried fruit, raw fruits, and vegetables.  1-3 days before the procedure - follow a clear liquid diet. Drink only clear liquids, such as clear broth or bouillon, black coffee or tea, clear juice, clear soft drinks or sports drinks, gelatin dessert, and popsicles. Avoid any liquids that contain red or purple dye.  On the day of the procedure - do not eat or drink anything during the 2 hours before the procedure, or within the time period that your health care provider recommends.  Bowel prep If you were prescribed an oral bowel prep to clean out your colon:  Take it as told by your health care provider. Starting the day before your procedure, you will need  to drink a large amount of medicated liquid. The liquid will cause you to have multiple loose stools until your stool is almost clear or light green.  If your skin or anus gets irritated from diarrhea, you may use these to relieve the irritation: ? Medicated wipes, such as adult wet wipes with aloe and vitamin E. ? A skin soothing-product like petroleum jelly.  If you vomit while drinking the bowel prep, take a break for up to 60 minutes and then begin the bowel prep again. If vomiting continues and you cannot take  the bowel prep without vomiting, call your health care provider.  General instructions  Ask your health care provider about changing or stopping your regular medicines. This is especially important if you are taking diabetes medicines or blood thinners.  Plan to have someone take you home from the hospital or clinic. What happens during the procedure?  An IV tube may be inserted into one of your veins.  You will be given medicine to help you relax (sedative).  To reduce your risk of infection: ? Your health care team will wash or sanitize their hands. ? Your anal area will be washed with soap.  You will be asked to lie on your side with your knees bent.  Your health care provider will lubricate a long, thin, flexible tube. The tube will have a camera and a light on the end.  The tube will be inserted into your anus.  The tube will be gently eased through your rectum and colon.  Air will be delivered into your colon to keep it open. You may feel some pressure or cramping.  The camera will be used to take images during the procedure.  A small tissue sample may be removed from your body to be examined under a microscope (biopsy). If any potential problems are found, the tissue will be sent to a lab for testing.  If small polyps are found, your health care provider may remove them and have them checked for cancer cells.  The tube that was inserted into your anus will be slowly removed. The procedure may vary among health care providers and hospitals. What happens after the procedure?  Your blood pressure, heart rate, breathing rate, and blood oxygen level will be monitored until the medicines you were given have worn off.  Do not drive for 24 hours after the exam.  You may have a small amount of blood in your stool.  You may pass gas and have mild abdominal cramping or bloating due to the air that was used to inflate your colon during the exam.  It is up to you to get the  results of your procedure. Ask your health care provider, or the department performing the procedure, when your results will be ready. This information is not intended to replace advice given to you by your health care provider. Make sure you discuss any questions you have with your health care provider. Document Released: 01/20/2000 Document Revised: 11/23/2015 Document Reviewed: 04/05/2015 Elsevier Interactive Patient Education  2018 [employer].  Colonoscopy, Adult, Care After This sheet gives you information about how to care for yourself after your procedure. Your health care provider may also give you more specific instructions. If you have problems or questions, contact your health care provider. What can I expect after the procedure? After the procedure, it is common to have:  A small amount of blood in your stool for 24 hours after the procedure.  Some gas.  Mild abdominal cramping or bloating.  Follow these instructions at home: General instructions   For the first 24 hours after the procedure: ? Do not drive or use machinery. ? Do not sign important documents. ? Do not drink alcohol. ? Do your regular daily activities at a slower pace than normal. ? Eat soft, easy-to-digest foods. ? Rest often.  Take over-the-counter or prescription medicines only as told by your health care provider.  It is up to you to get the results of your procedure. Ask your health care provider, or the department performing the procedure, when your results will be ready. Relieving cramping and bloating  Try walking around when you have cramps or feel bloated.  Apply heat to your abdomen as told by your health care provider. Use a heat source that your health care provider recommends, such as a moist heat pack or a heating pad. ? Place a towel between your skin and the heat source. ? Leave the heat on for 20-30 minutes. ? Remove the heat if your skin turns bright red. This is especially  important if you are unable to feel pain, heat, or cold. You may have a greater risk of getting burned. Eating and drinking  Drink enough fluid to keep your urine clear or pale yellow.  Resume your normal diet as instructed by your health care provider. Avoid heavy or fried foods that are hard to digest.  Avoid drinking alcohol for as long as instructed by your health care provider. Contact a health care provider if:  You have blood in your stool 2-3 days after the procedure. Get help right away if:  You have more than a small spotting of blood in your stool.  You pass large blood clots in your stool.  Your abdomen is swollen.  You have nausea or vomiting.  You have a fever.  You have increasing abdominal pain that is not relieved with medicine. This information is not intended to replace advice given to you by your health care provider. Make sure you discuss any questions you have with your health care provider. Document Released: 09/06/2003 Document Revised: 10/17/2015 Document Reviewed: 04/05/2015 Elsevier Interactive Patient Education  2018 [place] Anesthesia is a term that refers to techniques, procedures, and medicines that help a person stay safe and comfortable during a medical procedure. Monitored anesthesia care, or sedation, is one type of anesthesia. Your anesthesia specialist may recommend sedation if you will be having a procedure that does not require you to be unconscious, such as:  Cataract surgery.  A dental procedure.  A biopsy.  A colonoscopy.  During the procedure, you may receive a medicine to help you relax (sedative). There are three levels of sedation:  Mild sedation. At this level, you may feel awake and relaxed. You will be able to follow directions.  Moderate sedation. At this level, you will be sleepy. You may not remember the procedure.  Deep sedation. At this level, you will be asleep. You will not remember  the procedure.  The more medicine you are given, the deeper your level of sedation will be. Depending on how you respond to the procedure, the anesthesia specialist may change your level of sedation or the type of anesthesia to fit your needs. An anesthesia specialist will monitor you closely during the procedure. Let your health care provider know about:  Any allergies you have.  All medicines you are taking, including vitamins, herbs, eye drops, creams, and over-the-counter medicines.  Any use of steroids (by mouth or as a cream).  Any problems you or family members have had with sedatives and anesthetic medicines.  Any blood disorders you have.  Any surgeries you have had.  Any medical conditions you have, such as sleep apnea.  Whether you are pregnant or may be pregnant.  Any use of cigarettes, alcohol, or street drugs. What are the risks? Generally, this is a safe procedure. However, problems may occur, including:  Getting too much medicine (oversedation).  Nausea.  Allergic reaction to medicines.  Trouble breathing. If this happens, a breathing tube may be used to help with breathing. It will be removed when you are awake and breathing on your own.  Heart trouble.  Lung trouble.  Before the procedure Staying hydrated Follow instructions from your health care provider about hydration, which may include:  Up to 2 hours before the procedure - you may continue to drink clear liquids, such as water, clear fruit juice, black coffee, and plain tea.  Eating and drinking restrictions Follow instructions from your health care provider about eating and drinking, which may include:  8 hours before the procedure - stop eating heavy meals or foods such as meat, fried foods, or fatty foods.  6 hours before the procedure - stop eating light meals or foods, such as toast or cereal.  6 hours before the procedure - stop drinking milk or drinks that contain milk.  2 hours before  the procedure - stop drinking clear liquids.  Medicines Ask your health care provider about:  Changing or stopping your regular medicines. This is especially important if you are taking diabetes medicines or blood thinners.  Taking medicines such as aspirin and ibuprofen. These medicines can thin your blood. Do not take these medicines before your procedure if your health care provider instructs you not to.  Tests and exams  You will have a physical exam.  You may have blood tests done to show: ? How well your kidneys and liver are working. ? How well your blood can clot.  General instructions  Plan to have someone take you home from the hospital or clinic.  If you will be going home right after the procedure, plan to have someone with you for 24 hours.  What happens during the procedure?  Your blood pressure, heart rate, breathing, level of pain and overall condition will be monitored.  An IV tube will be inserted into one of your veins.  Your anesthesia specialist will give you medicines as needed to keep you comfortable during the procedure. This may mean changing the level of sedation.  The procedure will be performed. After the procedure  Your blood pressure, heart rate, breathing rate, and blood oxygen level will be monitored until the medicines you were given have worn off.  Do not drive for 24 hours if you received a sedative.  You may: ? Feel sleepy, clumsy, or nauseous. ? Feel forgetful about what happened after the procedure. ? Have a sore throat if you had a breathing tube during the procedure. ? Vomit. This information is not intended to replace advice given to you by your health care provider. Make sure you discuss any questions you have with your health care provider. Document Released: 10/18/2004 Document Revised: 07/01/2015 Document Reviewed: 05/15/2015 Elsevier Interactive Patient Education  2018 [place], Care  After These instructions provide you with information about caring for yourself after your procedure. Your health care provider may also give you more  specific instructions. Your treatment has been planned according to current medical practices, but problems sometimes occur. Call your health care provider if you have any problems or questions after your procedure. What can I expect after the procedure? After your procedure, it is common to:  Feel sleepy for several hours.  Feel clumsy and have poor balance for several hours.  Feel forgetful about what happened after the procedure.  Have poor judgment for several hours.  Feel nauseous or vomit.  Have a sore throat if you had a breathing tube during the procedure.  Follow these instructions at home: For at least 24 hours after the procedure:   Do not: ? Participate in activities in which you could fall or become injured. ? Drive. ? Use heavy machinery. ? Drink alcohol. ? Take sleeping pills or medicines that cause drowsiness. ? Make important decisions or sign legal documents. ? Take care of children on your own.  Rest. Eating and drinking  Follow the diet that is recommended by your health care provider.  If you vomit, drink water, juice, or soup when you can drink without vomiting.  Make sure you have little or no nausea before eating solid foods. General instructions  Have a responsible adult stay with you until you are awake and alert.  Take over-the-counter and prescription medicines only as told by your health care provider.  If you smoke, do not smoke without supervision.  Keep all follow-up visits as told by your health care provider. This is important. Contact a health care provider if:  You keep feeling nauseous or you keep vomiting.  You feel light-headed.  You develop a rash.  You have a fever. Get help right away if:  You have trouble breathing. This information is not intended to replace advice  given to you by your health care provider. Make sure you discuss any questions you have with your health care provider. Document Released: 05/15/2015 Document Revised: 09/14/2015 Document Reviewed: 05/15/2015 Elsevier Interactive Patient Education  [employer].

## 2017-01-17 ENCOUNTER — Encounter (HOSPITAL_COMMUNITY)
Admission: RE | Admit: 2017-01-17 | Discharge: 2017-01-17 | Disposition: A | Discharge status: Home / Self Care | Payer: BLUE CROSS/BLUE SHIELD | Source: Ambulatory Visit | Attending: Internal Medicine | Admitting: Internal Medicine

## 2017-01-17 ENCOUNTER — Encounter (HOSPITAL_COMMUNITY): Payer: Self-pay

## 2017-01-17 ENCOUNTER — Other Ambulatory Visit: Payer: Self-pay

## 2017-01-17 DIAGNOSIS — Z01818 Encounter for other preprocedural examination: Secondary | ICD-10-CM | POA: Insufficient documentation

## 2017-01-17 LAB — CBC
HEMATOCRIT: 47.4 % (ref 39.0–52.0)
HEMOGLOBIN: 15.5 g/dL (ref 13.0–17.0)
MCH: 33.3 pg (ref 26.0–34.0)
MCHC: 32.7 g/dL (ref 30.0–36.0)
MCV: 101.7 fL — ABNORMAL HIGH (ref 78.0–100.0)
Platelets: 196 10*3/uL (ref 150–400)
RBC: 4.66 MIL/uL (ref 4.22–5.81)
RDW: 12.9 % (ref 11.5–15.5)
WBC: 5.9 10*3/uL (ref 4.0–10.5)

## 2017-01-17 LAB — BASIC METABOLIC PANEL
Anion gap: 9 (ref 5–15)
BUN: 17 mg/dL (ref 6–20)
CHLORIDE: 100 mmol/L — AB (ref 101–111)
CO2: 27 mmol/L (ref 22–32)
Calcium: 9.9 mg/dL (ref 8.9–10.3)
Creatinine, Ser: 0.77 mg/dL (ref 0.61–1.24)
GFR calc non Af Amer: >60 mL/min (ref >60)
Glucose, Bld: 96 mg/dL (ref 65–99)
POTASSIUM: 4.3 mmol/L (ref 3.5–5.1)
Sodium: 136 mmol/L (ref 135–145)

## 2017-01-23 ENCOUNTER — Ambulatory Visit (HOSPITAL_COMMUNITY)
Admission: RE | Admit: 2017-01-23 | Discharge: 2017-01-23 | Disposition: A | Discharge status: Home / Self Care | Payer: BLUE CROSS/BLUE SHIELD | Source: Ambulatory Visit | Attending: Internal Medicine | Admitting: Internal Medicine

## 2017-01-23 ENCOUNTER — Encounter (HOSPITAL_COMMUNITY): Payer: Self-pay

## 2017-01-23 ENCOUNTER — Ambulatory Visit (HOSPITAL_COMMUNITY): Payer: BLUE CROSS/BLUE SHIELD | Admitting: Anesthesiology

## 2017-01-23 ENCOUNTER — Encounter (HOSPITAL_COMMUNITY)
Admission: RE | Disposition: A | Discharge status: Home / Self Care | Payer: Self-pay | Source: Ambulatory Visit | Attending: Internal Medicine

## 2017-01-23 DIAGNOSIS — K921 Melena: Secondary | ICD-10-CM | POA: Diagnosis not present

## 2017-01-23 DIAGNOSIS — Z86010 Personal history of colonic polyps: Secondary | ICD-10-CM

## 2017-01-23 DIAGNOSIS — Z6832 Body mass index (BMI) 32.0-32.9, adult: Secondary | ICD-10-CM | POA: Diagnosis not present

## 2017-01-23 DIAGNOSIS — Z7289 Other problems related to lifestyle: Secondary | ICD-10-CM | POA: Insufficient documentation

## 2017-01-23 DIAGNOSIS — F419 Anxiety disorder, unspecified: Secondary | ICD-10-CM | POA: Insufficient documentation

## 2017-01-23 DIAGNOSIS — G4730 Sleep apnea, unspecified: Secondary | ICD-10-CM | POA: Diagnosis not present

## 2017-01-23 DIAGNOSIS — G8929 Other chronic pain: Secondary | ICD-10-CM | POA: Insufficient documentation

## 2017-01-23 DIAGNOSIS — Z9989 Dependence on other enabling machines and devices: Secondary | ICD-10-CM | POA: Insufficient documentation

## 2017-01-23 DIAGNOSIS — I10 Essential (primary) hypertension: Secondary | ICD-10-CM | POA: Insufficient documentation

## 2017-01-23 DIAGNOSIS — M549 Dorsalgia, unspecified: Secondary | ICD-10-CM | POA: Insufficient documentation

## 2017-01-23 DIAGNOSIS — Z87891 Personal history of nicotine dependence: Secondary | ICD-10-CM | POA: Insufficient documentation

## 2017-01-23 DIAGNOSIS — Z79899 Other long term (current) drug therapy: Secondary | ICD-10-CM | POA: Diagnosis not present

## 2017-01-23 DIAGNOSIS — K625 Hemorrhage of anus and rectum: Secondary | ICD-10-CM

## 2017-01-23 HISTORY — PX: COLONOSCOPY WITH PROPOFOL: SHX5780

## 2017-01-23 SURGERY — COLONOSCOPY WITH PROPOFOL
Anesthesia: Monitor Anesthesia Care

## 2017-01-23 MED ORDER — LACTATED RINGERS IV SOLN
INTRAVENOUS | Status: DC
  Administered 2017-01-23: 11:00:00 via INTRAVENOUS

## 2017-01-23 MED ORDER — CHLORHEXIDINE GLUCONATE CLOTH 2 % EX PADS: 6.0000 | MEDICATED_PAD | Freq: Once | CUTANEOUS | Status: DC

## 2017-01-23 MED ORDER — MIDAZOLAM HCL 2 MG/2ML IJ SOLN
INTRAMUSCULAR | Status: AC
Start: 2017-01-23 — End: 2017-01-23
  Filled 2017-01-23: qty 2

## 2017-01-23 MED ORDER — FENTANYL CITRATE (PF) 100 MCG/2ML IJ SOLN
INTRAMUSCULAR | Status: AC
  Filled 2017-01-23: qty 2

## 2017-01-23 MED ORDER — FENTANYL CITRATE (PF) 100 MCG/2ML IJ SOLN
25.0000 ug | Freq: Once | INTRAMUSCULAR | Status: AC
  Administered 2017-01-23: 25 ug via INTRAVENOUS

## 2017-01-23 MED ORDER — PROPOFOL 500 MG/50ML IV EMUL
INTRAVENOUS | Status: DC | PRN
  Administered 2017-01-23: 100 ug/kg/min via INTRAVENOUS
  Administered 2017-01-23: 75 ug/kg/min via INTRAVENOUS

## 2017-01-23 MED ORDER — MIDAZOLAM HCL 2 MG/2ML IJ SOLN
1.0000 mg | INTRAMUSCULAR | Status: AC
  Administered 2017-01-23: 2 mg via INTRAVENOUS

## 2017-01-23 MED ORDER — PROPOFOL 10 MG/ML IV BOLUS
INTRAVENOUS | Status: DC | PRN
  Administered 2017-01-23: 20 mg via INTRAVENOUS
  Administered 2017-01-23: 40 mg via INTRAVENOUS

## 2017-01-23 NOTE — Anesthesia Preprocedure Evaluation (Signed)
Anesthesia Evaluation  Patient identified by MRN, date of birth, ID band Patient awake    Reviewed: Allergy & Precautions, NPO status , Patient's Chart, lab work & pertinent test results  Airway Mallampati: II  TM Distance: >3 FB Neck ROM: Full    Dental  (+) Teeth Intact   Pulmonary sleep apnea , former smoker,    Pulmonary exam normal breath sounds clear to auscultation       Cardiovascular Exercise Tolerance: Good hypertension, Pt. on medications Normal cardiovascular exam Rhythm:Regular Rate:Normal  Walks a mile per day.   Neuro/Psych PSYCHIATRIC DISORDERS Anxiety  Neuromuscular disease    GI/Hepatic (+)     substance abuse  alcohol use,   Endo/Other  Morbid obesity  Renal/GU      Musculoskeletal   Abdominal (+) + obese,   Peds  Hematology  (+) anemia ,   Anesthesia Other Findings   Reproductive/Obstetrics                             Anesthesia Physical Anesthesia Plan  ASA: III  Anesthesia Plan: MAC   Post-op Pain Management:    Induction: Intravenous  PONV Risk Score and Plan:   Airway Management Planned: Simple Face Mask  Additional Equipment:   Intra-op Plan:   Post-operative Plan:   Informed Consent: I have reviewed the patients History and Physical, chart, labs and discussed the procedure including the risks, benefits and alternatives for the proposed anesthesia with the patient or authorized representative who has indicated his/her understanding and acceptance.     Plan Discussed with:   Anesthesia Plan Comments:         Anesthesia Quick Evaluation

## 2017-01-23 NOTE — Op Note (Signed)
[facility] Patient Name: Jared Bentley Procedure Date: 01/23/2017 10:33 AM MRN: [id number] Date of Birth: 1955/12/02 Attending MD: Norvel Richards , MD CSN: [id number] Age: 61 Admit Type: Outpatient Procedure:                Colonoscopy Indications:              Hematochezia Providers:                Norvel Richards, MD, Jeanann Lewandowsky. Sharon Seller, RN,                            Nelma Rothman, Technician, Randa Spike, Technician Referring MD:              Medicines:                Propofol per Anesthesia Complications:            No immediate complications. Estimated Blood Loss:     Estimated blood loss: none. Procedure:                Pre-Anesthesia Assessment:                           - Prior to the procedure, a History and Physical                            was performed, and patient medications and                            allergies were reviewed. The patient's tolerance of                            previous anesthesia was also reviewed. The risks                            and benefits of the procedure and the sedation                            options and risks were discussed with the patient.                            All questions were answered, and informed consent                            was obtained. Prior Anticoagulants: The patient has                            taken no previous anticoagulant or antiplatelet                            agents. ASA Grade Assessment: II - A patient with                            mild systemic disease. After reviewing the risks  and benefits, the patient was deemed in                            satisfactory condition to undergo the procedure.                           After obtaining informed consent, the colonoscope                            was passed under direct vision. Throughout the                            procedure, the patient's blood pressure, pulse, and   oxygen saturations were monitored continuously. The                            EC-[id number] ([id number]) scope was introduced through                            the and advanced to the the cecum, identified by                            appendiceal orifice and ileocecal valve. The                            colonoscopy was performed without difficulty. The                            patient tolerated the procedure well. The quality                            of the bowel preparation was adequate. The                            ileocecal valve, appendiceal orifice, and rectum                            were photographed. The quality of the bowel                            preparation was adequate. The entire colon was well                            visualized. Scope In: 10:57:26 AM Scope Out: 11:11:36 AM Scope Withdrawal Time: 0 hours 8 minutes 44 seconds  Total Procedure Duration: 0 hours 14 minutes 10 seconds  Findings:      The perianal and digital rectal examinations were normal. Slightly       friable anorectum. No significant hemorrhoids.      The colon (entire examined portion) appeared normal.      No additional abnormalities were found on retroflexion. Impression:               - The entire examined colon is normal.                           -  No specimens collected. I suspect benign                            anorectal bleeding. Moderate Sedation:      Moderate (conscious) sedation was personally administered by an       anesthesia professional. The following parameters were monitored: oxygen       saturation, heart rate, blood pressure, respiratory rate, EKG, adequacy       of pulmonary ventilation, and response to care. Total physician       intraservice time was 19 minutes. Recommendation:           - Patient has a contact number available for                            emergencies. The signs and symptoms of potential                            delayed complications were  discussed with the                            patient. Return to normal activities tomorrow.                            Written discharge instructions were provided to the                            patient.                           - Resume previous diet.                           - Continue present medications.                           - Repeat colonoscopy in 10 years for screening                            purposes.                           - Return to GI clinic PRN. Patient is to report any                            recurrent rectal bleeding. Procedure Code(s):        --- Professional ---                           [phone], Colonoscopy, flexible; diagnostic, including                            collection of specimen(s) by brushing or washing,                            when performed (separate procedure) Diagnosis Code(s):        --- Professional ---  K92.1, Melena (includes Hematochezia) CPT copyright 2016 American Medical Association. All rights reserved. The codes documented in this report are preliminary and upon coder review may  be revised to meet current compliance requirements. Cristopher Estimable. Arjay Jaskiewicz, MD Norvel Richards, MD 01/23/2017 11:18:47 AM This report has been signed electronically. Number of Addenda: 0

## 2017-01-23 NOTE — Anesthesia Postprocedure Evaluation (Signed)
Anesthesia Post Note  Patient: Jared Bentley  Procedure(s) Performed: COLONOSCOPY WITH PROPOFOL (N/A )  Patient location during evaluation: PACU Anesthesia Type: MAC Level of consciousness: awake and alert Pain management: satisfactory to patient Respiratory status: spontaneous breathing Cardiovascular status: stable Postop Assessment: no apparent nausea or vomiting Anesthetic complications: no     Last Vitals:  Vitals:   01/23/17 1026 01/23/17 1120  BP: 124/81 115/79  Pulse: 65 72  Resp:  13  Temp: 36.9 C 36.7 C  SpO2: 95% 98%    Last Pain:  Vitals:   01/23/17 1026  TempSrc: Oral                 Koralynn Greenspan

## 2017-01-23 NOTE — H&P (Signed)
@LOGO @   Primary Care Physician:  Mikey Kirschner, MD Primary Gastroenterologist:  Dr. Gala Romney  Pre-Procedure History & Physical: HPI:  Jared Bentley is a 61 y.o. male here for further evaluation of rough rectal bleeding. Colonoscopy planned for today.  Past Medical History:  Diagnosis Date  . Anxiety   . Carpal tunnel syndrome   . Chronic back pain   . Diffuse large B cell lymphoma ([place]) 12/30/2013  . ED (erectile dysfunction)   . Glucose intolerance (impaired glucose tolerance)   . Heavy alcohol consumption    6-12 beers daily  . Hypertension   . Sleep apnea    severe OSA-uses a cpap    Past Surgical History:  Procedure Laterality Date  . CARPAL TUNNEL RELEASE Left 07/29/2014   Procedure: CARPAL TUNNEL RELEASE;  Surgeon: Sanjuana Kava, MD;  Location: [facility];  Service: Orthopedics;  Laterality: Left;  . CARPAL TUNNEL RELEASE Right 11/16/2014   Procedure: CARPAL TUNNEL RELEASE;  Surgeon: Sanjuana Kava, MD;  Location: [facility];  Service: Orthopedics;  Laterality: Right;  . COLONOSCOPY W/ POLYPECTOMY    . LYMPH NODE BIOPSY Left 12/22/2013   Procedure: LEFT INGUINAL LYMPH NODE BIOPSY;  Surgeon: [name], MD;  Location: [place];  Service: General;  Laterality: Left;  . PORT-A-CATH REMOVAL N/A 07/08/2014   Procedure: REMOVAL PORT-A-CATH;  Surgeon: [name], MD;  Location: [facility];  Service: General;  Laterality: N/A;  . PORTACATH PLACEMENT N/A 01/06/2014   Procedure: INSERTION PORT-A-CATH;  Surgeon: [name], MD;  Location: [place];  Service: General;  Laterality: N/A;  . VASECTOMY    . WISDOM TOOTH EXTRACTION      Prior to Admission medications   Medication Sig Start Date End Date Taking? Authorizing Provider  enalapril (VASOTEC) 20 MG tablet Take 1 tablet (20 mg total) by mouth every morning. 08/27/16  Yes Mikey Kirschner, MD  hydrocortisone cream 1 % Apply 1 application topically as needed for itching.   Yes [provider]   ibuprofen (ADVIL,MOTRIN) 200 MG tablet Take 200 mg by mouth every 6 (six) hours as needed for headache or moderate pain.   Yes [provider]  naproxen sodium (ALEVE) 220 MG tablet Take 220 mg by mouth 2 (two) times daily as needed (for pain or headache).   Yes [provider]  sildenafil (REVATIO) 20 MG tablet Take 2 tablets po as needed for sex Patient taking differently: Take 40 mg by mouth as needed (for sex).  03/05/16  Yes Mikey Kirschner, MD  GAVILYTE-N WITH FLAVOR PACK 420 g solution Take 4,000 mLs by mouth once. 12/10/16   [provider]  triamcinolone cream (KENALOG) 0.1 % Apply 1 application topically 2 (two) times daily. Patient not taking: Reported on 12/10/2016 08/27/16   Mikey Kirschner, MD    Allergies as of 12/10/2016  . (No Known Allergies)    Family History  Problem Relation Age of Onset  . Cancer Father        bone  . Cancer Mother        tongue ca  . Colon cancer Neg Hx     Social History   Socioeconomic History  . Marital status: Married    Spouse name: Not on file  . Number of children: 1  . Years of education: Not on file  . Highest education level: Not on file  Social Needs  . Financial resource strain: Not on file  . Food insecurity - worry:  Not on file  . Food insecurity - inability: Not on file  . Transportation needs - medical: Not on file  . Transportation needs - non-medical: Not on file  Occupational History  . Not on file  Tobacco Use  . Smoking status: Former Smoker    Packs/day: 1.00    Years: 30.00    Pack years: 30.00    Last attempt to quit: 05/14/2004    Years since quitting: 12.7  . Smokeless tobacco: Never Used  Substance and Sexual Activity  . Alcohol use: Yes    Alcohol/week: 36.0 oz    Types: 60 Cans of beer per week    Comment: daily-6-12 beer  . Drug use: No  . Sexual activity: Yes    Birth control/protection: None  Other Topics Concern  . Not on file  Social History Narrative    Retired.  [place] [address] [place].      Review of Systems: See HPI, otherwise negative ROS  Physical Exam: BP 124/81   Pulse 65   Temp 98.5 F (36.9 C) (Oral)   Ht 6' (1.829 m)   Wt 240 lb (108.9 kg)   SpO2 95%   BMI 32.55 kg/m  General:   Alert,  Well-developed, well-nourished, pleasant and cooperative in NAD Mouth:  No deformity or lesions. Neck:  Supple; no masses or thyromegaly. No significant cervical adenopathy. Lungs:  Clear throughout to auscultation.   No wheezes, crackles, or rhonchi. No acute distress. Heart:  Regular rate and rhythm; no murmurs, clicks, rubs,  or gallops. Abdomen: Non-distended, normal bowel sounds.  Soft and nontender without appreciable mass or hepatosplenomegaly.  Pulses:  Normal pulses noted. Extremities:  Without clubbing or edema.  Impression: Pleasant 60. Old gentleman with intermittent paper hematochezia. It's been 10 years since he had his lower GI tract evaluated.  Recommendations:  I have offered the patient a diagnostic colonoscopy in the near future.  The risks, benefits, limitations, alternatives and imponderables have been reviewed with the patient. Questions have been answered. All parties are agreeable.     Notice: This dictation was prepared with Dragon dictation along with smaller phrase technology. Any transcriptional errors that result from this process are unintentional and may not be corrected upon review.

## 2017-01-23 NOTE — Discharge Instructions (Signed)
°  Colonoscopy Discharge Instructions  Read the instructions outlined below and refer to this sheet in the next few weeks. These discharge instructions provide you with general information on caring for yourself after you leave the hospital. Your doctor may also give you specific instructions. While your treatment has been planned according to the most current medical practices available, unavoidable complications occasionally occur. If you have any problems or questions after discharge, call Dr. Gala Romney at [phone]. ACTIVITY  You may resume your regular activity, but move at a slower pace for the next 24 hours.   Take frequent rest periods for the next 24 hours.   Walking will help get rid of the air and reduce the bloated feeling in your belly (abdomen).   No driving for 24 hours (because of the medicine (anesthesia) used during the test).    Do not sign any important legal documents or operate any machinery for 24 hours (because of the anesthesia used during the test).  NUTRITION  Drink plenty of fluids.   You may resume your normal diet as instructed by your doctor.   Begin with a light meal and progress to your normal diet. Heavy or fried foods are harder to digest and may make you feel sick to your stomach (nauseated).   Avoid alcoholic beverages for 24 hours or as instructed.  MEDICATIONS  You may resume your normal medications unless your doctor tells you otherwise.  WHAT YOU CAN EXPECT TODAY  Some feelings of bloating in the abdomen.   Passage of more gas than usual.   Spotting of blood in your stool or on the toilet paper.  IF YOU HAD POLYPS REMOVED DURING THE COLONOSCOPY:  No aspirin products for 7 days or as instructed.   No alcohol for 7 days or as instructed.   Eat a soft diet for the next 24 hours.  FINDING OUT THE RESULTS OF YOUR TEST Not all test results are available during your visit. If your test results are not back during the visit, make an appointment  with your caregiver to find out the results. Do not assume everything is normal if you have not heard from your caregiver or the medical facility. It is important for you to follow up on all of your test results.  SEEK IMMEDIATE MEDICAL ATTENTION IF:  You have more than a spotting of blood in your stool.   Your belly is swollen (abdominal distention).   You are nauseated or vomiting.   You have a temperature over 101.   You have abdominal pain or discomfort that is severe or gets worse throughout the day.    Repeat colonoscopy in 10 years for screening purposes  Notify us if rectal bleeding recurs.

## 2017-01-23 NOTE — Anesthesia Procedure Notes (Signed)
Procedure Name: MAC Date/Time: 01/23/2017 10:49 AM Performed by: Vista Deck, CRNA Pre-anesthesia Checklist: Patient identified, Emergency Drugs available, Suction available, Timeout performed and Patient being monitored Patient Re-evaluated:Patient Re-evaluated prior to induction Oxygen Delivery Method: Non-rebreather mask

## 2017-01-23 NOTE — Transfer of Care (Signed)
Immediate Anesthesia Transfer of Care Note  Patient: Jared Bentley  Procedure(s) Performed: COLONOSCOPY WITH PROPOFOL (N/A )  Patient Location: PACU  Anesthesia Type:MAC  Level of Consciousness: awake, alert  and patient cooperative  Airway & Oxygen Therapy: Patient Spontanous Breathing  Post-op Assessment: Report given to RN and Post -op Vital signs reviewed and stable  Post vital signs: Reviewed and stable  Last Vitals:  Vitals:   01/23/17 1026  BP: 124/81  Pulse: 65  Temp: 36.9 C  SpO2: 95%    Last Pain:  Vitals:   01/23/17 1026  TempSrc: Oral         Complications: No apparent anesthesia complications

## 2017-01-30 ENCOUNTER — Encounter (HOSPITAL_COMMUNITY): Payer: Self-pay | Admitting: Internal Medicine

## 2017-02-27 ENCOUNTER — Ambulatory Visit: Payer: BLUE CROSS/BLUE SHIELD | Admitting: Family Medicine

## 2017-02-27 ENCOUNTER — Encounter: Payer: Self-pay | Admitting: Family Medicine

## 2017-02-27 VITALS — BP 110/70 | Ht 72.0 in | Wt 252.0 lb

## 2017-02-27 DIAGNOSIS — G4733 Obstructive sleep apnea (adult) (pediatric): Secondary | ICD-10-CM | POA: Diagnosis not present

## 2017-02-27 DIAGNOSIS — N5201 Erectile dysfunction due to arterial insufficiency: Secondary | ICD-10-CM | POA: Diagnosis not present

## 2017-02-27 DIAGNOSIS — I10 Essential (primary) hypertension: Secondary | ICD-10-CM | POA: Diagnosis not present

## 2017-02-27 MED ORDER — ENALAPRIL MALEATE 20 MG PO TABS: 20.0000 mg | ORAL_TABLET | Freq: Every morning | ORAL | 1 refills | Status: DC

## 2017-02-27 NOTE — Progress Notes (Signed)
   Subjective:    Patient ID: Jared Bentley, male    DOB: [birth year], 62 y.o.   MRN: [id number]  Hypertension  This is a chronic problem. There are no compliance problems (takes meds every day, eats healthy, exercises).    Smashed right thumb nail with a hammer about 4 months ago.  Still has a nail that is somewhat deformed.  Would like me to look at.  Ongoing challenges with obstructive sleep apnea.  Wears his mask faithfully.  States it definitely helps his sleep.  Tries to use each night.   Blood pressure medicine and blood pressure levels reviewed today with patient. Compliant with blood pressure medicine. States does not miss a dose. No obvious side effects. Blood pressure generally good when checked elsewhere. Watching salt intake.  Recent colon scope, next one due in trn yrs  Chronic left knee pain, inflam, followed b orthoe  execising daiy   tries not to drink too much, but recognies 6 or 7 testing drinks 6 or 7 beers per day.  Realizes that it is a fair amount   Review of Systems No headache, no major weight loss or weight gain, no chest pain no back pain abdominal pain no change in bowel habits complete ROS otherwise negative     Objective:   Physical Exam  Alert and oriented, vitals reviewed and stable, NAD ENT-TM's and ext canals WNL bilat via otoscopic exam Soft palate, tonsils and post pharynx WNL via oropharyngeal exam Neck-symmetric, no masses; thyroid nonpalpable and nontender Pulmonary-no tachypnea or accessory muscle use; Clear without wheezes via auscultation Card--no abnrml murmurs, rhythm reg and rate WNL Carotid pulses symmetric, without bruits       Assessment & Plan:  Impression hypertension control discussed to maintain same meds  2.  Sleep apnea.  Encouraged to wear unit discussed  3.  Lymphoma followed by specialist overall stable at this time  4.  History of elevated liver enzymes with excess alcohol use.  Discussed.  Followed by  gastroenterologist  5.  Nail deformity post injury may be permanent discussed   6.  Erectile dysfunction medication continues to benefit will maintain Diet exercise discussed compliance discussed follow-up as scheduled medications refilled wellness plus chronic in 6 months

## 2017-03-29 DIAGNOSIS — M25562 Pain in left knee: Secondary | ICD-10-CM | POA: Insufficient documentation

## 2017-04-11 IMAGING — CT CT ABD-PELV W/ CM
2 of 5 series · 16 of 46 positions shown, 18 images · IV contrast (iopamidol)
Comparison: 06/07/2014

CLINICAL DATA: B-cell lymphoma.  Restaging

EXAM:
CT ABDOMEN AND PELVIS WITH CONTRAST
TECHNIQUE: Multidetector CT imaging of the abdomen and pelvis was performed
using the standard protocol following bolus administration of
intravenous contrast.
CONTRAST:  100mL JEZYPX-8XX IOPAMIDOL (JEZYPX-8XX) INJECTION 61%

[Series 2: axial st · axial · 0.98mm/px · z∈[-672,-257]mm · 13 of 97 slices shown, 15 images]
[im 7/97  soft-tissue]
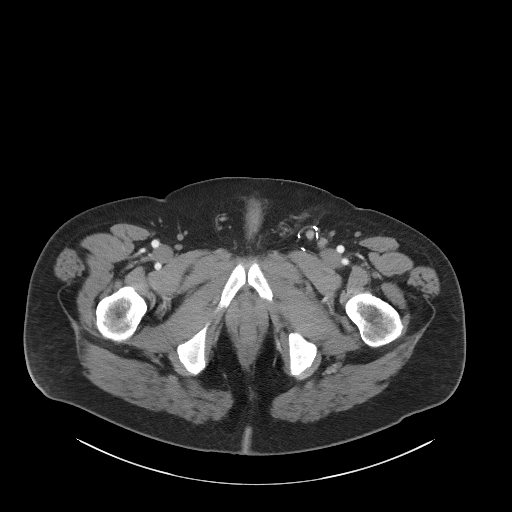
[im 7/97  bone]
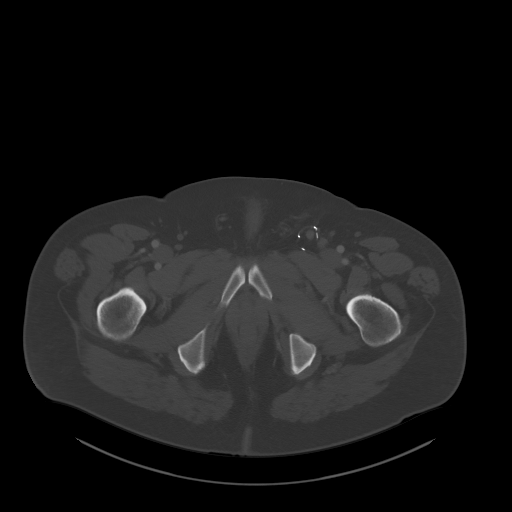
[im 14/97  soft-tissue]
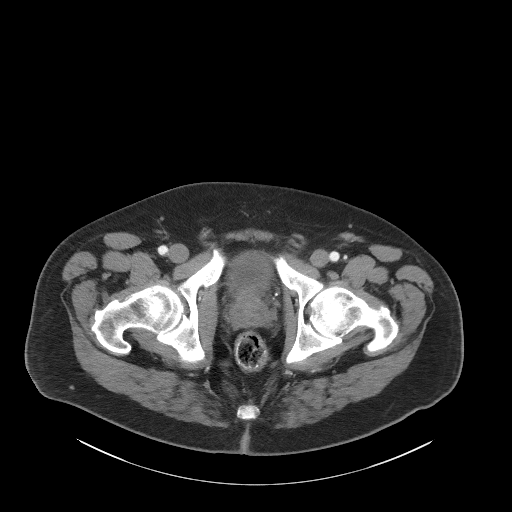
[im 21/97  soft-tissue]
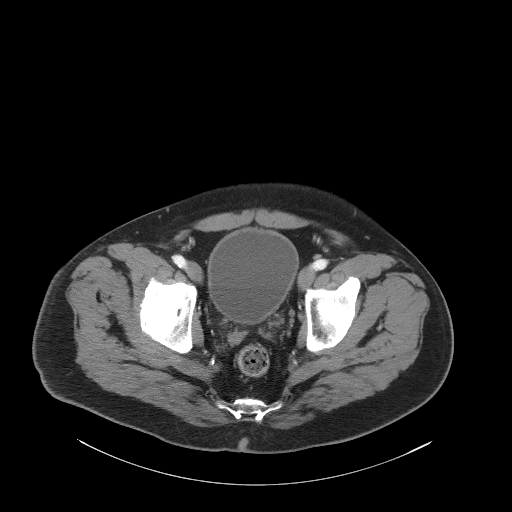
[im 28/97  soft-tissue]
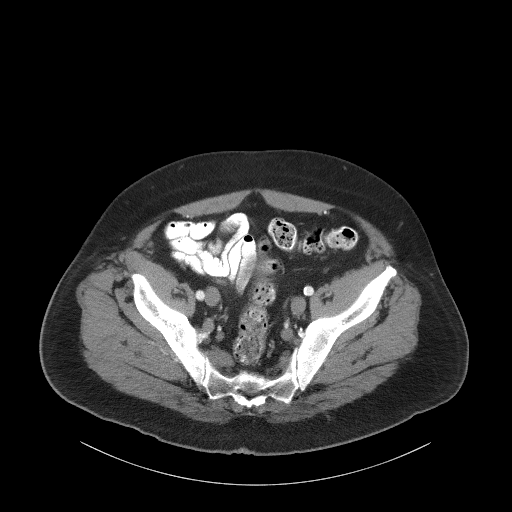
[im 35/97  soft-tissue]
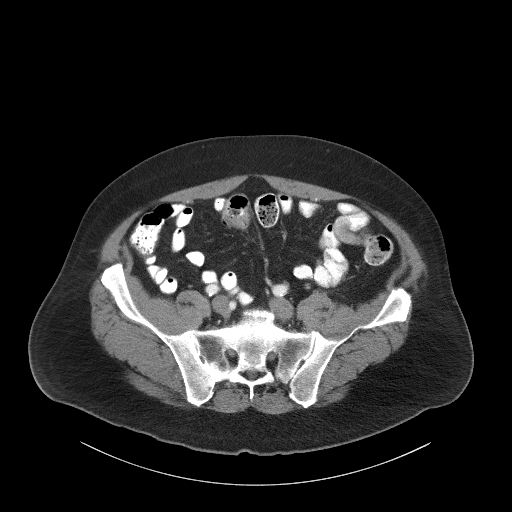
[im 42/97  soft-tissue]
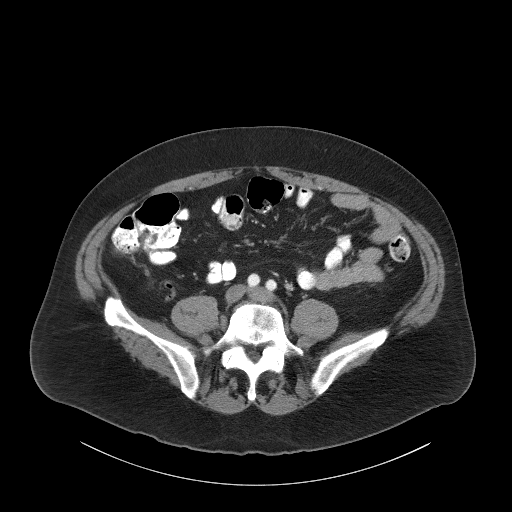
[im 49/97  soft-tissue]
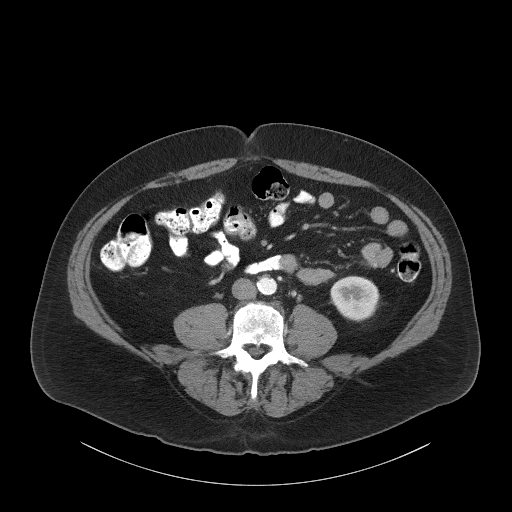
[im 55/97  soft-tissue]
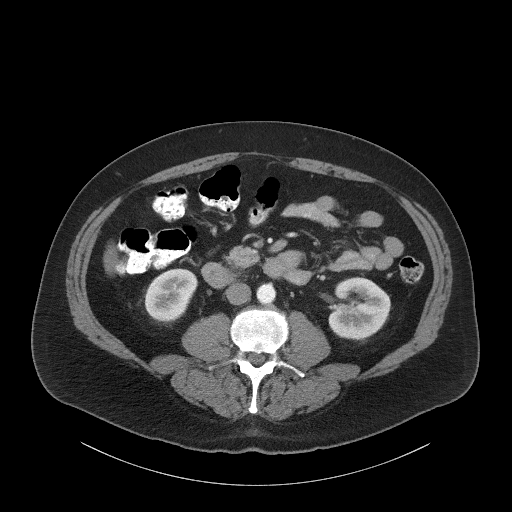
[im 62/97  soft-tissue]
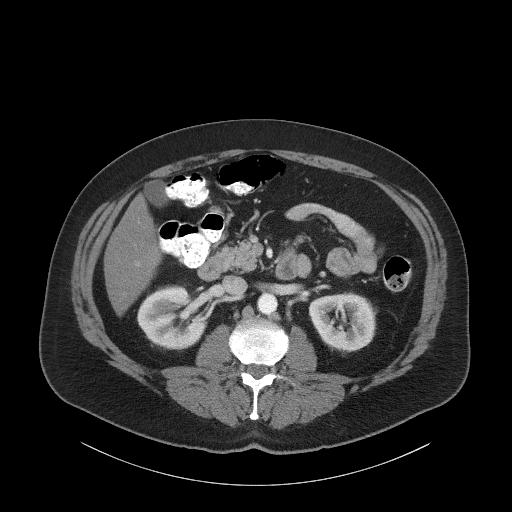
[im 62/97  bone]
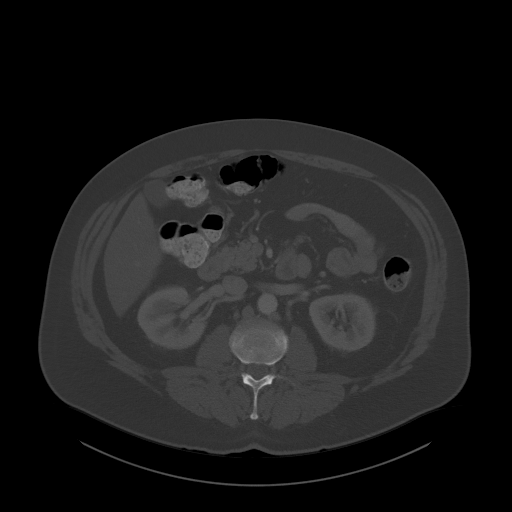
[im 69/97  soft-tissue]
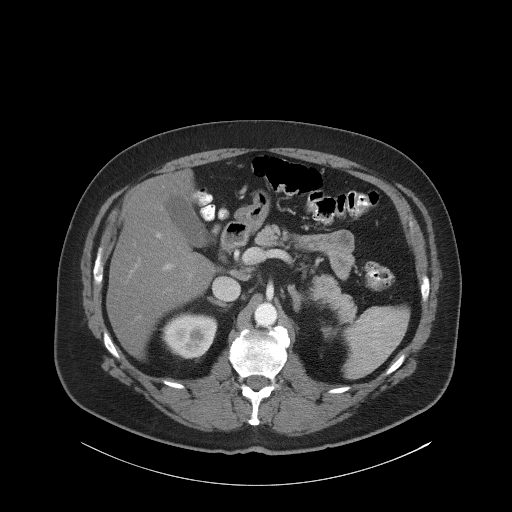
[im 76/97  soft-tissue]
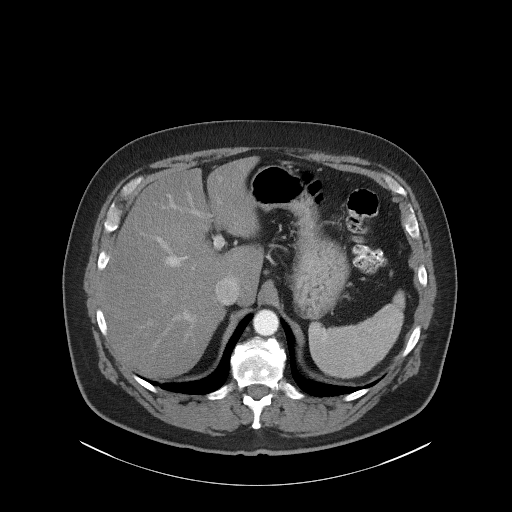
[im 83/97  soft-tissue]
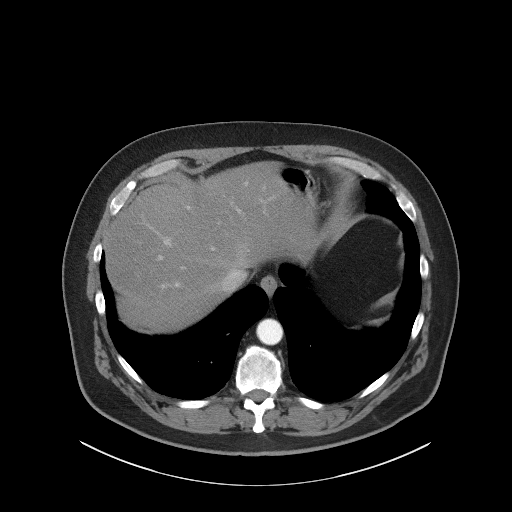
[im 90/97  soft-tissue]
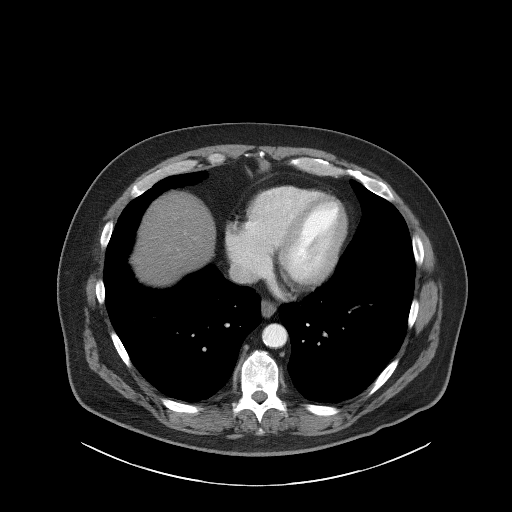

[Series 6: coronal st · coronal · 0.81mm/px · 3 of 117 slices shown]
[im 39/117  soft-tissue]
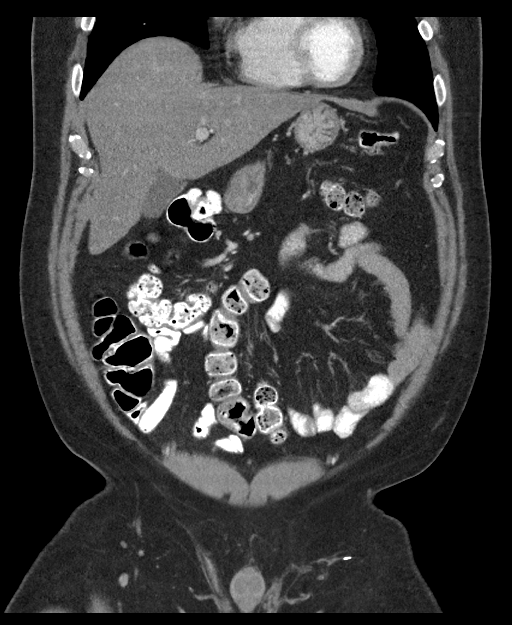
[im 52/117  soft-tissue]
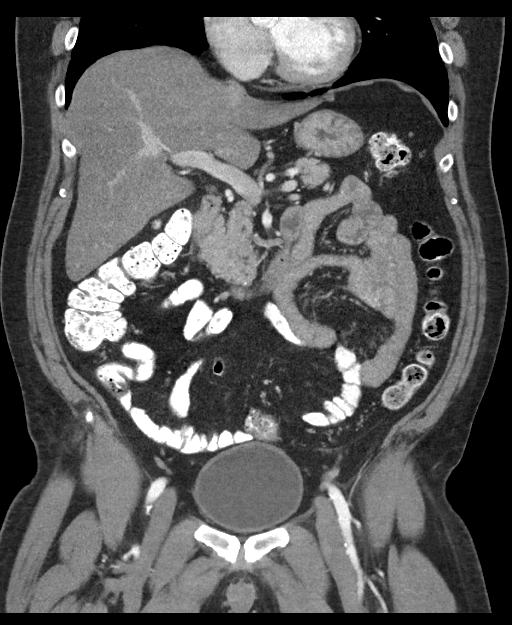
[im 65/117  soft-tissue]
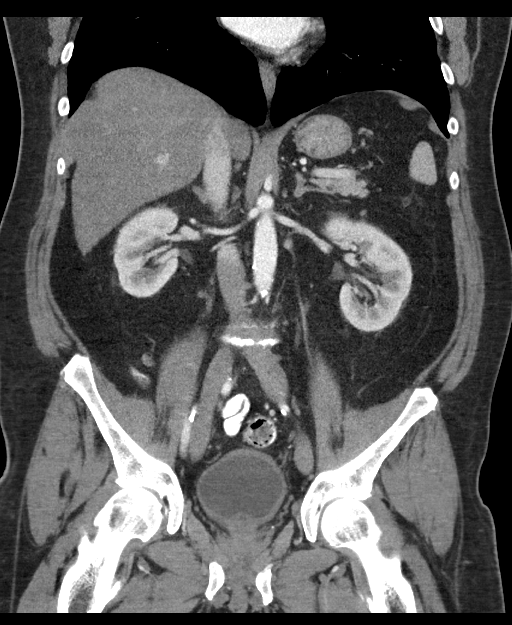

[16 of 46 positions shown; findings below may reference images not displayed]

FINDINGS: Lower chest: Calcified granulomas identified within the right lower
lobe. 4 mm nodule within the left lower lobe is noncalcified, image
number 25 of series 4. Unchanged from previous exam.

Hepatobiliary: Hepatic steatosis. There are 2 small low-attenuation
structures which measure up to 4 mm, image 14 of series 2. Too small
to reliably characterize. Small stone within the dependent portion
of the gallbladder measuring 4 mm. No biliary dilatation.

Pancreas: Unremarkable. No pancreatic ductal dilatation or
surrounding inflammatory changes.

Spleen: Normal in size without focal abnormality.

Adrenals/Urinary Tract: Adrenal glands are unremarkable. Kidneys are
normal, without renal calculi, focal lesion, or hydronephrosis.
Bladder is unremarkable.

Stomach/Bowel: Stomach is within normal limits. Appendix appears
normal. No evidence of bowel wall thickening, distention, or
inflammatory changes.

Vascular/Lymphatic: Aortic atherosclerosis. No enlarged upper
abdominal lymph nodes. No pelvic or inguinal adenopathy identified.

Reproductive: Prostate is unremarkable.

Other: No abdominal wall hernia or abnormality. No abdominopelvic
ascites.

Musculoskeletal: Degenerative disc disease noted within the lower
lumbar spine.
IMPRESSION: 1. No acute findings. No evidence for residual or recurrent mass or
adenopathy.
2. Aortic atherosclerosis
3. Hepatic steatosis
4. Gallstone.

## 2017-05-02 ENCOUNTER — Other Ambulatory Visit: Payer: Self-pay | Admitting: Hematology and Oncology

## 2017-05-02 DIAGNOSIS — Z8572 Personal history of non-Hodgkin lymphomas: Secondary | ICD-10-CM

## 2017-05-03 ENCOUNTER — Telehealth: Payer: Self-pay | Admitting: Hematology and Oncology

## 2017-05-03 ENCOUNTER — Inpatient Hospital Stay: Payer: BLUE CROSS/BLUE SHIELD

## 2017-05-03 ENCOUNTER — Inpatient Hospital Stay: Payer: BLUE CROSS/BLUE SHIELD | Attending: Hematology and Oncology | Admitting: Hematology and Oncology

## 2017-05-03 ENCOUNTER — Encounter: Payer: Self-pay | Admitting: Hematology and Oncology

## 2017-05-03 DIAGNOSIS — Z8572 Personal history of non-Hodgkin lymphomas: Secondary | ICD-10-CM

## 2017-05-03 DIAGNOSIS — Z79899 Other long term (current) drug therapy: Secondary | ICD-10-CM | POA: Diagnosis not present

## 2017-05-03 DIAGNOSIS — K760 Fatty (change of) liver, not elsewhere classified: Secondary | ICD-10-CM | POA: Insufficient documentation

## 2017-05-03 DIAGNOSIS — R718 Other abnormality of red blood cells: Secondary | ICD-10-CM | POA: Insufficient documentation

## 2017-05-03 DIAGNOSIS — Z9221 Personal history of antineoplastic chemotherapy: Secondary | ICD-10-CM

## 2017-05-03 LAB — CBC WITH DIFFERENTIAL/PLATELET
BASOS ABS: 0 10*3/uL (ref 0.0–0.1)
BASOS PCT: 1 %
EOS PCT: 3 %
Eosinophils Absolute: 0.1 10*3/uL (ref 0.0–0.5)
HEMATOCRIT: 45.6 % (ref 38.4–49.9)
Hemoglobin: 15.2 g/dL (ref 13.0–17.1)
LYMPHS PCT: 25 %
Lymphs Abs: 1.3 10*3/uL (ref 0.9–3.3)
MCH: 32.8 pg (ref 27.2–33.4)
MCHC: 33.4 g/dL (ref 32.0–36.0)
MCV: 98.1 fL — AB (ref 79.3–98.0)
MONO ABS: 0.7 10*3/uL (ref 0.1–0.9)
MONOS PCT: 13 %
NEUTROS ABS: 3.1 10*3/uL (ref 1.5–6.5)
Neutrophils Relative %: 58 %
PLATELETS: 194 10*3/uL (ref 140–400)
RBC: 4.65 MIL/uL (ref 4.20–5.82)
RDW: 13.2 % (ref 11.0–14.6)
WBC: 5.3 10*3/uL (ref 4.0–10.3)

## 2017-05-03 LAB — COMPREHENSIVE METABOLIC PANEL
ALT: 47 U/L (ref 0–55)
ANION GAP: 10 (ref 3–11)
AST: 33 U/L (ref 5–34)
Albumin: 4 g/dL (ref 3.5–5.0)
Alkaline Phosphatase: 53 U/L (ref 40–150)
BILIRUBIN TOTAL: 0.6 mg/dL (ref 0.2–1.2)
BUN: 19 mg/dL (ref 7–26)
CHLORIDE: 101 mmol/L (ref 98–109)
CO2: 27 mmol/L (ref 22–29)
Calcium: 10.1 mg/dL (ref 8.4–10.4)
Creatinine, Ser: 1.07 mg/dL (ref 0.70–1.30)
GFR calc Af Amer: >60 mL/min (ref >60)
Glucose, Bld: 102 mg/dL (ref 70–140)
POTASSIUM: 4.7 mmol/L (ref 3.5–5.1)
Sodium: 138 mmol/L (ref 136–145)
TOTAL PROTEIN: 7.2 g/dL (ref 6.4–8.3)

## 2017-05-03 LAB — LACTATE DEHYDROGENASE: LDH: 151 U/L (ref 125–245)

## 2017-05-03 NOTE — Assessment & Plan Note (Signed)
He has mild intermittent elevated MCV, likely due to recent alcohol intake or fatty liver congestion I recommend close observation only.

## 2017-05-03 NOTE — Assessment & Plan Note (Signed)
The patient was motivated to lose weight and has lost some weight His last CT scan from 2018 showed evidence of fatty liver disease I continue to encourage him to continue his weight loss efforts

## 2017-05-03 NOTE — Progress Notes (Signed)
Jared Bentley OFFICE PROGRESS NOTE  Patient Care Team: Mikey Kirschner, MD as PCP - General (Family Medicine) Fay Records, MD as Consulting Physician (Cardiology) Carola Frost, RN as Registered Nurse  ASSESSMENT & PLAN:  History of B-cell lymphoma Clinically, he had no signs of disease. PET CT scan from 2016 and CT from 04/30/16 showed complete remission. I will see him back in 12 months with history, physical examination and blood work.  We reviewed current guidelines. There is no benefit for future surveillance imaging unless he has signs and symptoms to suggest disease recurrence  Morbid obesity due to excess calories ([place]) The patient was motivated to lose weight and has lost some weight His last CT scan from 2018 showed evidence of fatty liver disease I continue to encourage him to continue his weight loss efforts  Elevated MCV He has mild intermittent elevated MCV, likely due to recent alcohol intake or fatty liver congestion I recommend close observation only.   No orders of the defined types were placed in this encounter.   INTERVAL HISTORY: Please see below for problem oriented charting. He returns for further follow-up He is doing very well Denies recent infection No new lymphadenopathy He is staying active He is attempting to lose weight with dietary modification He complained of mild knee pain and will undergo knee surgery next week Denies abnormal weight loss, anorexia or night sweats  SUMMARY OF ONCOLOGIC HISTORY: Oncology History   Diffuse large B cell lymphoma   Staging form: Lymphoid Neoplasms, AJCC 6th Edition     Clinical stage from 12/30/2013: Stage III - Signed by Heath Lark, MD on 01/08/2014     Pathologic: No stage assigned - Unsigned       History of B-cell lymphoma   12/14/2013 Imaging    CT scan of the abdomen and pelvis showed bulky left iliac chain and left inguinal adenopathy, highly worrisome for lymphoma.      12/22/2013 Surgery    He underwent excisional lymph node biopsy of the left inguinal region that confirm lymphoma diagnosis. No      12/22/2013 Pathology Results    Accession: [id number] pathology showed a high-grade follicular lymphoma with possibly foci of diffuse large B-cell lymphoma      01/04/2014 Bone Marrow Biopsy    BM biopsy was negative with normal cytogenetics      01/05/2014 Imaging    PET CT scan showed lymph nodes involvement of both sides of her diaphragm      01/06/2014 Surgery    He has port placed      01/07/2014 Imaging    ECHO showed preserved EF of 73%, mildly dilated atrium      01/12/2014 - 04/27/2014 Chemotherapy    He is received 6 cycles of R CHOP chemotherapy      02/02/2014 Adverse Reaction    Vincristine dose was reduced by 50% due to early signs of peripheral neuropathy      03/11/2014 Imaging    Repeat PET scan showed near complete response to treatment      06/07/2014 Imaging    PET scan showed complete response to treatment      04/30/2016 Imaging    CT: No acute findings. No evidence for residual or recurrent mass or adenopathy. 2. Aortic atherosclerosis 3. Hepatic steatosis 4. Gallstone.       REVIEW OF SYSTEMS:   Constitutional: Denies fevers, chills or abnormal weight loss Eyes: Denies blurriness of vision Ears, nose, mouth, throat, and  face: Denies mucositis or sore throat Respiratory: Denies cough, dyspnea or wheezes Cardiovascular: Denies palpitation, chest discomfort or lower extremity swelling Gastrointestinal:  Denies nausea, heartburn or change in bowel habits Skin: Denies abnormal skin rashes Lymphatics: Denies new lymphadenopathy or easy bruising Neurological:Denies numbness, tingling or new weaknesses Behavioral/Psych: Mood is stable, no new changes  All other systems were reviewed with the patient and are negative.  I have reviewed the past medical history, past surgical history, social history and family history with  the patient and they are unchanged from previous note.  ALLERGIES:  has No Known Allergies.  MEDICATIONS:  Current Outpatient Medications  Medication Sig Dispense Refill  . enalapril (VASOTEC) 20 MG tablet Take 1 tablet (20 mg total) by mouth every morning. 90 tablet 1  . hydrocortisone cream 1 % Apply 1 application topically as needed for itching.    . naproxen sodium (ALEVE) 220 MG tablet Take 220 mg by mouth 2 (two) times daily as needed (for pain or headache).    . sildenafil (REVATIO) 20 MG tablet Take 2 tablets po as needed for sex (Patient taking differently: Take 40 mg by mouth as needed (for sex). ) 50 tablet 0   No current facility-administered medications for this visit.     PHYSICAL EXAMINATION: ECOG PERFORMANCE STATUS: 0 - Asymptomatic  Vitals:   05/03/17 0821  BP: 109/75  Pulse: 65  Resp: 18  Temp: 97.6 F (36.4 C)  SpO2: 98%   Filed Weights   05/03/17 0821  Weight: 249 lb 3.2 oz (113 kg)    GENERAL:alert, no distress and comfortable SKIN: skin color, texture, turgor are normal, no rashes or significant lesions EYES: normal, Conjunctiva are pink and non-injected, sclera clear OROPHARYNX:no exudate, no erythema and lips, buccal mucosa, and tongue normal  NECK: supple, thyroid normal size, non-tender, without nodularity LYMPH:  no palpable lymphadenopathy in the cervical, axillary or inguinal LUNGS: clear to auscultation and percussion with normal breathing effort HEART: regular rate & rhythm and no murmurs and no lower extremity edema ABDOMEN:abdomen soft, non-tender and normal bowel sounds Musculoskeletal:no cyanosis of digits and no clubbing  NEURO: alert & oriented x 3 with fluent speech, no focal motor/sensory deficits  LABORATORY DATA:  I have reviewed the data as listed    Component Value Date/Time   NA 136 01/17/2017 1003   NA 140 11/01/2016 0749   K 4.3 01/17/2017 1003   K 4.3 11/01/2016 0749   CL 100 (L) 01/17/2017 1003   CO2 27 01/17/2017  1003   CO2 27 11/01/2016 0749   GLUCOSE 96 01/17/2017 1003   GLUCOSE 103 11/01/2016 0749   BUN 17 01/17/2017 1003   BUN 20.4 11/01/2016 0749   CREATININE 0.77 01/17/2017 1003   CREATININE 1.0 11/01/2016 0749   CALCIUM 9.9 01/17/2017 1003   CALCIUM 10.2 11/01/2016 0749   PROT 7.4 11/01/2016 0749   ALBUMIN 4.0 11/01/2016 0749   AST 41 (H) 11/01/2016 0749   ALT 65 (H) 11/01/2016 0749   ALKPHOS 58 11/01/2016 0749   BILITOT 0.79 11/01/2016 0749   GFRNONAA >60 01/17/2017 1003   GFRAA >60 01/17/2017 1003    No results found for: SPEP, UPEP  Lab Results  Component Value Date   WBC 5.3 05/03/2017   NEUTROABS 3.1 05/03/2017   HGB 15.2 05/03/2017   HCT 45.6 05/03/2017   MCV 98.1 (H) 05/03/2017   PLT 194 05/03/2017      Chemistry      Component Value Date/Time   NA  136 01/17/2017 1003   NA 140 11/01/2016 0749   K 4.3 01/17/2017 1003   K 4.3 11/01/2016 0749   CL 100 (L) 01/17/2017 1003   CO2 27 01/17/2017 1003   CO2 27 11/01/2016 0749   BUN 17 01/17/2017 1003   BUN 20.4 11/01/2016 0749   CREATININE 0.77 01/17/2017 1003   CREATININE 1.0 11/01/2016 0749      Component Value Date/Time   CALCIUM 9.9 01/17/2017 1003   CALCIUM 10.2 11/01/2016 0749   ALKPHOS 58 11/01/2016 0749   AST 41 (H) 11/01/2016 0749   ALT 65 (H) 11/01/2016 0749   BILITOT 0.79 11/01/2016 0749     All questions were answered. The patient knows to call the clinic with any problems, questions or concerns. No barriers to learning was detected.  I spent 15 minutes counseling the patient face to face. The total time spent in the appointment was 20 minutes and more than 50% was on counseling and review of test results  Heath Lark, MD 05/03/2017 8:37 AM

## 2017-05-03 NOTE — Assessment & Plan Note (Signed)
Clinically, he had no signs of disease. PET CT scan from 2016 and CT from 04/30/16 showed complete remission. I will see him back in 12 months with history, physical examination and blood work.  We reviewed current guidelines. There is no benefit for future surveillance imaging unless he has signs and symptoms to suggest disease recurrence

## 2017-05-03 NOTE — Telephone Encounter (Signed)
Gave patient AVs and calendar of upcoming April 2020 appointments.  °

## 2017-07-03 ENCOUNTER — Encounter: Payer: Self-pay | Admitting: Family Medicine

## 2017-07-03 ENCOUNTER — Ambulatory Visit: Payer: BLUE CROSS/BLUE SHIELD | Admitting: Family Medicine

## 2017-07-03 VITALS — BP 120/80 | Temp 98.2°F | Ht 72.0 in | Wt 251.0 lb

## 2017-07-03 DIAGNOSIS — M1000 Idiopathic gout, unspecified site: Secondary | ICD-10-CM | POA: Diagnosis not present

## 2017-07-03 MED ORDER — COLCHICINE 0.6 MG PO TABS: ORAL_TABLET | ORAL | 3 refills | Status: DC

## 2017-07-03 MED ORDER — INDOMETHACIN 50 MG PO CAPS: 50.0000 mg | ORAL_CAPSULE | Freq: Three times a day (TID) | ORAL | 1 refills | Status: DC | PRN

## 2017-07-03 NOTE — Patient Instructions (Signed)

## 2017-07-03 NOTE — Progress Notes (Signed)
   Subjective:    Patient ID: Jared Bentley, male    DOB: 06/24/1955, 62 y.o.   MRN: [id number]  HPI  Patient is here today with complaints of a gout flare on right great toe.Ongoing since Monday. Has had a bout with it in the past.He has been taking Ibuprofen which helps him sleep. Patient has had this ongoing Relates a lot of joint pain discomfort redness denies high fever chills sweats PMH benign  Review of Systems Please see above.    Objective:   Physical Exam  Calf normal no sign of DVT knee normal patient not toxic does have what appears to be gout at the MTP greater than 15 minutes spent discussing gout discussing treatment options patient does not want prednisone because of previous side effects      Assessment & Plan:  Gout Education taught Colchicine twice daily until symptoms improve Indocin 3 times daily as needed until symptoms improved Follow-up if progressive troubles or if worse Patient does not want daily treatment currently unless he starts having more frequent occurrences he has a wellness exam in the summer he will follow-up sooner problems

## 2017-08-27 ENCOUNTER — Encounter: Payer: Self-pay | Admitting: Hematology and Oncology

## 2017-08-27 ENCOUNTER — Ambulatory Visit: Payer: BLUE CROSS/BLUE SHIELD | Admitting: Family Medicine

## 2017-08-27 ENCOUNTER — Encounter: Payer: Self-pay | Admitting: Family Medicine

## 2017-08-27 VITALS — BP 122/78 | Ht 72.0 in | Wt 252.0 lb

## 2017-08-27 DIAGNOSIS — M1000 Idiopathic gout, unspecified site: Secondary | ICD-10-CM | POA: Diagnosis not present

## 2017-08-27 DIAGNOSIS — Z13220 Encounter for screening for lipoid disorders: Secondary | ICD-10-CM

## 2017-08-27 DIAGNOSIS — Z125 Encounter for screening for malignant neoplasm of prostate: Secondary | ICD-10-CM

## 2017-08-27 DIAGNOSIS — I10 Essential (primary) hypertension: Secondary | ICD-10-CM

## 2017-08-27 DIAGNOSIS — Z0000 Encounter for general adult medical examination without abnormal findings: Secondary | ICD-10-CM | POA: Diagnosis not present

## 2017-08-27 DIAGNOSIS — Z79899 Other long term (current) drug therapy: Secondary | ICD-10-CM

## 2017-08-27 MED ORDER — ZOSTER VAC RECOMB ADJUVANTED 50 MCG/0.5ML IM SUSR: 0.5000 mL | Freq: Once | INTRAMUSCULAR | 1 refills | Status: AC

## 2017-08-27 MED ORDER — ENALAPRIL MALEATE 20 MG PO TABS: 20.0000 mg | ORAL_TABLET | Freq: Every morning | ORAL | 1 refills | Status: DC

## 2017-08-27 NOTE — Progress Notes (Signed)
   Subjective:    Patient ID: Jared Bentley, male    DOB: [birth year], 62 y.o.   MRN: [id number]  HPI The patient comes in today for a wellness visit.    A review of their health history was completed.  A review of medications was also completed.  Any needed refills; none  Eating habits: health conscious  Falls/  MVA accidents in past few months: none  Regular exercise: yes  Specialist pt sees on regular basis: oncologist  Preventative health issues were discussed.   Additional concerns: had gout about a month ago, has questions about medication.   Pt walking an d exrcising fairly well, had left knee oerated on    States has cut down alcohol, drinks around six cans per day     Review of Systems  Constitutional: Negative for activity change, appetite change and fever.  HENT: Negative for congestion and rhinorrhea.   Eyes: Negative for discharge.  Respiratory: Negative for cough and wheezing.   Cardiovascular: Negative for chest pain.  Gastrointestinal: Negative for abdominal pain, blood in stool and vomiting.  Genitourinary: Negative for difficulty urinating and frequency.  Musculoskeletal: Negative for neck pain.  Skin: Negative for rash.  Allergic/Immunologic: Negative for environmental allergies and food allergies.  Neurological: Negative for weakness and headaches.  Psychiatric/Behavioral: Negative for agitation.  All other systems reviewed and are negative.      Objective:   Physical Exam  Constitutional: He appears well-developed and well-nourished.  HENT:  Head: Normocephalic and atraumatic.  Right Ear: External ear normal.  Left Ear: External ear normal.  Nose: Nose normal.  Mouth/Throat: Oropharynx is clear and moist.  Eyes: Right eye exhibits no discharge. Left eye exhibits no discharge. No scleral icterus.  Neck: Normal range of motion. Neck supple. No thyromegaly present.  Cardiovascular: Normal rate, regular rhythm and normal heart sounds.  No  murmur heard. Pulmonary/Chest: Effort normal and breath sounds normal. No respiratory distress. He has no wheezes.  Abdominal: Soft. Bowel sounds are normal. He exhibits no distension and no mass. There is no tenderness.  Genitourinary: Penis normal.  Genitourinary Comments: Prostate within normal limits  Musculoskeletal: Normal range of motion. He exhibits no edema.  Lymphadenopathy:    He has no cervical adenopathy.  Neurological: He is alert. He exhibits normal muscle tone. Coordination normal.  Skin: Skin is warm and dry. No erythema.  Psychiatric: He has a normal mood and affect. His behavior is normal. Judgment normal.     Right foot residual inflammatory changes     Assessment & Plan:  1 impression wellness exam.  Diet discussed.  Exercise discussed in vaccines discussed.  Shingrix recommended colonoscopy last yr next due in ten yrs post  #2 hypertension.  Good control discussed maintain same meds rationale discussed  3.  Gout.  Discussed.  Check uric acid.  If elevated will add allopurinol.  Patient has had several bouts of this in the past  Further recommendations based on blood work.  Follow-up in 6 months

## 2017-08-29 ENCOUNTER — Other Ambulatory Visit: Payer: Self-pay | Admitting: Family Medicine

## 2017-08-29 LAB — HEPATIC FUNCTION PANEL
AG Ratio: 1.9 (calc) (ref 1.0–2.5)
ALT: 55 U/L — ABNORMAL HIGH (ref 9–46)
AST: 37 U/L — ABNORMAL HIGH (ref 10–35)
Albumin: 4.5 g/dL (ref 3.6–5.1)
Alkaline phosphatase (APISO): 52 U/L (ref 40–115)
BILIRUBIN DIRECT: 0.1 mg/dL (ref 0.0–0.2)
BILIRUBIN TOTAL: 0.4 mg/dL (ref 0.2–1.2)
Globulin: 2.4 g/dL (calc) (ref 1.9–3.7)
Indirect Bilirubin: 0.3 mg/dL (calc) (ref 0.2–1.2)
TOTAL PROTEIN: 6.9 g/dL (ref 6.1–8.1)

## 2017-08-29 LAB — LIPID PANEL
CHOL/HDL RATIO: 2.9 (calc) (ref <5.0)
Cholesterol: 193 mg/dL (ref <200)
HDL: 66 mg/dL (ref >40)
LDL CHOLESTEROL (CALC): 108 mg/dL — AB
NON-HDL CHOLESTEROL (CALC): 127 mg/dL (ref <130)
Triglycerides: 95 mg/dL (ref <150)

## 2017-08-29 LAB — BASIC METABOLIC PANEL WITH GFR
BUN: 18 mg/dL (ref 7–25)
CO2: 26 mmol/L (ref 20–32)
Calcium: 9.8 mg/dL (ref 8.6–10.3)
Chloride: 103 mmol/L (ref 98–110)
Creat: 0.86 mg/dL (ref 0.70–1.25)
GFR, Est African American: 108 mL/min/{1.73_m2} (ref >=60)
GFR, Est Non African American: 93 mL/min/{1.73_m2} (ref >=60)
GLUCOSE: 87 mg/dL (ref 65–99)
POTASSIUM: 4.5 mmol/L (ref 3.5–5.3)
SODIUM: 139 mmol/L (ref 135–146)

## 2017-08-29 LAB — URIC ACID: URIC ACID, SERUM: 9.1 mg/dL — AB (ref 4.0–8.0)

## 2017-08-29 LAB — PSA: PSA: 0.8 ng/mL (ref <=4.0)

## 2017-09-06 ENCOUNTER — Other Ambulatory Visit: Payer: Self-pay | Admitting: Family Medicine

## 2017-09-06 ENCOUNTER — Telehealth: Payer: Self-pay | Admitting: Family Medicine

## 2017-09-06 DIAGNOSIS — M1000 Idiopathic gout, unspecified site: Secondary | ICD-10-CM

## 2017-09-06 MED ORDER — ALLOPURINOL 100 MG PO TABS: 100.0000 mg | ORAL_TABLET | Freq: Every day | ORAL | 2 refills | Status: DC

## 2017-09-06 MED ORDER — COLCHICINE 0.6 MG PO TABS: ORAL_TABLET | ORAL | 3 refills | Status: DC

## 2017-09-06 NOTE — Telephone Encounter (Signed)
Pt seen Dr.Steve for physical on 08/27/17. Notes indicate that allopurinol would be added if uric level were high. Please advise.

## 2017-09-06 NOTE — Telephone Encounter (Signed)
Pt is calling to have medication called in to [id number] , [place] for his high uric acid levels.

## 2017-09-06 NOTE — Telephone Encounter (Signed)
Allopurinol 100 mg 1 daily, #30, 2 refills I would also recommend the first 3 weeks while on allopurinol take colchicine 0.6 mg 1 daily, may have refills on his colchicine Healthy diet Check metabolic 7 in uric acid in 4 to 6 weeks

## 2017-09-06 NOTE — Telephone Encounter (Signed)
Medication sent in; lab orders placed. Lab orders mailed to patient. Pt verbalized understanding.

## 2017-09-06 NOTE — Progress Notes (Unsigned)
Met

## 2017-09-08 NOTE — Telephone Encounter (Signed)
ok 

## 2017-10-22 ENCOUNTER — Other Ambulatory Visit: Payer: Self-pay | Admitting: Family Medicine

## 2017-10-22 LAB — BASIC METABOLIC PANEL WITH GFR
BUN: 18 mg/dL (ref 7–25)
CO2: 34 mmol/L — ABNORMAL HIGH (ref 20–32)
CREATININE: 0.97 mg/dL (ref 0.70–1.25)
Calcium: 10 mg/dL (ref 8.6–10.3)
Chloride: 99 mmol/L (ref 98–110)
GFR, EST NON AFRICAN AMERICAN: 83 mL/min/{1.73_m2} (ref >=60)
GFR, Est African American: 97 mL/min/{1.73_m2} (ref >=60)
GLUCOSE: 102 mg/dL — AB (ref 65–99)
Potassium: 4.9 mmol/L (ref 3.5–5.3)
SODIUM: 139 mmol/L (ref 135–146)

## 2017-10-22 LAB — URIC ACID: Uric Acid, Serum: 7.2 mg/dL (ref 4.0–8.0)

## 2017-10-31 ENCOUNTER — Ambulatory Visit (INDEPENDENT_AMBULATORY_CARE_PROVIDER_SITE_OTHER): Payer: BLUE CROSS/BLUE SHIELD | Admitting: Otolaryngology

## 2017-10-31 DIAGNOSIS — J342 Deviated nasal septum: Secondary | ICD-10-CM

## 2017-10-31 DIAGNOSIS — J343 Hypertrophy of nasal turbinates: Secondary | ICD-10-CM

## 2017-11-04 ENCOUNTER — Telehealth: Payer: Self-pay | Admitting: *Deleted

## 2017-11-04 NOTE — Telephone Encounter (Signed)
Patient advised uric acid much better 7.2 (was 9.1) Kidney fuction fine.  Glucose very slightly up at 102 Patient verbalized understanding.

## 2017-11-04 NOTE — Telephone Encounter (Signed)
Call pt, uric acid much better 7.2 (was 9,1) Kid fuction fine   Glu very slightlky up at 102

## 2017-11-04 NOTE — Telephone Encounter (Signed)
Message for dr Richardson Landry

## 2017-11-29 ENCOUNTER — Other Ambulatory Visit: Payer: Self-pay | Admitting: Family Medicine

## 2018-02-25 ENCOUNTER — Ambulatory Visit: Payer: BLUE CROSS/BLUE SHIELD | Admitting: Family Medicine

## 2018-02-25 ENCOUNTER — Encounter: Payer: Self-pay | Admitting: Family Medicine

## 2018-02-25 VITALS — BP 120/78 | Ht 72.0 in | Wt 258.1 lb

## 2018-02-25 DIAGNOSIS — I10 Essential (primary) hypertension: Secondary | ICD-10-CM

## 2018-02-25 DIAGNOSIS — G4733 Obstructive sleep apnea (adult) (pediatric): Secondary | ICD-10-CM

## 2018-02-25 DIAGNOSIS — M1000 Idiopathic gout, unspecified site: Secondary | ICD-10-CM

## 2018-02-25 DIAGNOSIS — N5201 Erectile dysfunction due to arterial insufficiency: Secondary | ICD-10-CM | POA: Diagnosis not present

## 2018-02-25 MED ORDER — ENALAPRIL MALEATE 20 MG PO TABS: 20.0000 mg | ORAL_TABLET | Freq: Every morning | ORAL | 1 refills | Status: DC

## 2018-02-25 MED ORDER — SILDENAFIL CITRATE 20 MG PO TABS: ORAL_TABLET | ORAL | 0 refills | Status: DC

## 2018-02-25 MED ORDER — ZOSTER VAC RECOMB ADJUVANTED 50 MCG/0.5ML IM SUSR: 0.5000 mL | Freq: Once | INTRAMUSCULAR | 1 refills | Status: AC

## 2018-02-25 MED ORDER — COLCHICINE 0.6 MG PO TABS: ORAL_TABLET | ORAL | 3 refills | Status: DC

## 2018-02-25 MED ORDER — ALLOPURINOL 100 MG PO TABS: 100.0000 mg | ORAL_TABLET | Freq: Every day | ORAL | 1 refills | Status: DC

## 2018-02-25 NOTE — Progress Notes (Signed)
   Subjective:    Patient ID: Jared Bentley, male    DOB: May 11, 1955, 63 y.o.   MRN: [id number]  HPI  Patient is here today to follow up on his chronic health issues.  He has a history of hypertension and takes Enalapril 20 mg once per daily. He eats healthy,and he gets exercise by walking a mile per day. He see's Oncology Dr.Gorsuch.   Blood pressure medicine and blood pressure levels reviewed today with patient. Compliant with blood pressure medicine. States does not miss a dose. No obvious side effects. Blood pressure generally good when checked elsewhere. Watching salt intake.   Pt had a mild flare of the gout, took a few indomethacin tabs, fade away    On allopurinok  Walking mile per day, nearly very day  mubers on the bp running good   Patient has CPAP machine.  Uses his machine faithfully.  Definitely helps daytime restfulness.  Also helps quality of sleep.  Wears throughout the night.  Utilizes medication for erectile dysfunction.  No obvious side effects compliant with meds  No other issues today.  Review of Systems No headache, no major weight loss or weight gain, no chest pain no back pain abdominal pain no change in bowel habits complete ROS otherwise negative     Objective:   Physical Exam   Alert and oriented, vitals reviewed and stable, NAD ENT-TM's and ext canals WNL bilat via otoscopic exam Soft palate, tonsils and post pharynx WNL via oropharyngeal exam Neck-symmetric, no masses; thyroid nonpalpable and nontender Pulmonary-no tachypnea or accessory muscle use; Clear without wheezes via auscultation Card--no abnrml murmurs, rhythm reg and rate WNL Carotid pulses symmetric, without bruits      Assessment & Plan:  Impression 1 hypertension.  Good control discussed maintain same meds  2.  Erectile dysfunction clinically stable to maintain same meds  3.  Gout.  Clinically stable.  Handling allopurinol well.  No further major recurrences.  One small  1  4.  Obstructive sleep apnea.  Compliant with device.  Definitely assisting sleep.  Medications refilled diet exercise discussed vaccines clarified

## 2018-02-27 ENCOUNTER — Ambulatory Visit: Payer: BLUE CROSS/BLUE SHIELD | Admitting: Family Medicine

## 2018-04-29 ENCOUNTER — Telehealth: Payer: Self-pay

## 2018-04-29 NOTE — Telephone Encounter (Signed)
Spoke with pt by phone and gave new appt date and time in July 

## 2018-05-09 ENCOUNTER — Ambulatory Visit: Payer: BLUE CROSS/BLUE SHIELD | Admitting: Hematology and Oncology

## 2018-05-09 ENCOUNTER — Other Ambulatory Visit: Payer: BLUE CROSS/BLUE SHIELD

## 2018-08-06 ENCOUNTER — Other Ambulatory Visit: Payer: Self-pay | Admitting: Hematology and Oncology

## 2018-08-06 DIAGNOSIS — Z8572 Personal history of non-Hodgkin lymphomas: Secondary | ICD-10-CM

## 2018-08-07 ENCOUNTER — Inpatient Hospital Stay (HOSPITAL_BASED_OUTPATIENT_CLINIC_OR_DEPARTMENT_OTHER): Payer: BC Managed Care – PPO | Admitting: Hematology and Oncology

## 2018-08-07 ENCOUNTER — Inpatient Hospital Stay: Payer: BC Managed Care – PPO | Attending: Hematology and Oncology

## 2018-08-07 ENCOUNTER — Other Ambulatory Visit: Payer: Self-pay

## 2018-08-07 ENCOUNTER — Telehealth: Payer: Self-pay

## 2018-08-07 ENCOUNTER — Encounter: Payer: Self-pay | Admitting: Hematology and Oncology

## 2018-08-07 VITALS — BP 107/69 | HR 68 | Temp 97.5°F | Resp 17 | Ht 72.0 in | Wt 245.0 lb

## 2018-08-07 DIAGNOSIS — R21 Rash and other nonspecific skin eruption: Secondary | ICD-10-CM | POA: Diagnosis not present

## 2018-08-07 DIAGNOSIS — R748 Abnormal levels of other serum enzymes: Secondary | ICD-10-CM

## 2018-08-07 DIAGNOSIS — Z9221 Personal history of antineoplastic chemotherapy: Secondary | ICD-10-CM

## 2018-08-07 DIAGNOSIS — Z8572 Personal history of non-Hodgkin lymphomas: Secondary | ICD-10-CM | POA: Diagnosis not present

## 2018-08-07 DIAGNOSIS — Z79899 Other long term (current) drug therapy: Secondary | ICD-10-CM | POA: Diagnosis not present

## 2018-08-07 LAB — CBC WITH DIFFERENTIAL/PLATELET
Abs Immature Granulocytes: 0.02 10*3/uL (ref 0.00–0.07)
Basophils Absolute: 0 10*3/uL (ref 0.0–0.1)
Basophils Relative: 1 %
Eosinophils Absolute: 0.1 10*3/uL (ref 0.0–0.5)
Eosinophils Relative: 2 %
HCT: 45.8 % (ref 39.0–52.0)
Hemoglobin: 15.7 g/dL (ref 13.0–17.0)
Immature Granulocytes: 0 %
Lymphocytes Relative: 26 %
Lymphs Abs: 1.4 10*3/uL (ref 0.7–4.0)
MCH: 33.5 pg (ref 26.0–34.0)
MCHC: 34.3 g/dL (ref 30.0–36.0)
MCV: 97.7 fL (ref 80.0–100.0)
Monocytes Absolute: 0.8 10*3/uL (ref 0.1–1.0)
Monocytes Relative: 14 %
Neutro Abs: 3.1 10*3/uL (ref 1.7–7.7)
Neutrophils Relative %: 57 %
Platelets: 197 10*3/uL (ref 150–400)
RBC: 4.69 MIL/uL (ref 4.22–5.81)
RDW: 12.6 % (ref 11.5–15.5)
WBC: 5.5 10*3/uL (ref 4.0–10.5)
nRBC: 0 % (ref 0.0–0.2)

## 2018-08-07 LAB — COMPREHENSIVE METABOLIC PANEL
ALT: 119 U/L — ABNORMAL HIGH (ref 0–44)
AST: 77 U/L — ABNORMAL HIGH (ref 15–41)
Albumin: 4 g/dL (ref 3.5–5.0)
Alkaline Phosphatase: 63 U/L (ref 38–126)
Anion gap: 11 (ref 5–15)
BUN: 16 mg/dL (ref 8–23)
CO2: 23 mmol/L (ref 22–32)
Calcium: 9.8 mg/dL (ref 8.9–10.3)
Chloride: 104 mmol/L (ref 98–111)
Creatinine, Ser: 0.97 mg/dL (ref 0.61–1.24)
GFR calc Af Amer: >60 mL/min (ref >60)
GFR calc non Af Amer: >60 mL/min (ref >60)
Glucose, Bld: 101 mg/dL — ABNORMAL HIGH (ref 70–99)
Potassium: 4.3 mmol/L (ref 3.5–5.1)
Sodium: 138 mmol/L (ref 135–145)
Total Bilirubin: 0.6 mg/dL (ref 0.3–1.2)
Total Protein: 7.6 g/dL (ref 6.5–8.1)

## 2018-08-07 NOTE — Assessment & Plan Note (Signed)
His skin rash is most consistent with psoriasis There is no contraindication for him to be treated by his dermatologist since he is off all treatment for lymphoma

## 2018-08-07 NOTE — Assessment & Plan Note (Signed)
He has intermittent elevated liver enzymes, likely due to recent alcohol intake and possibly fatty liver disease. I recommend observation only

## 2018-08-07 NOTE — Telephone Encounter (Signed)
Called and given below message. He verbalized understanding. No alcohol in 9 day. He will follow up with PCP on 8/4.

## 2018-08-07 NOTE — Assessment & Plan Note (Signed)
Clinically, he had no signs of disease. PET CT scan from 2016 and CT from 04/30/16 showed complete remission. I will see him back in 12 months with history, physical examination and blood work.  We reviewed current guidelines. There is no benefit for future surveillance imaging unless he has signs and symptoms to suggest disease recurrence I recommend annual influenza vaccination

## 2018-08-07 NOTE — Telephone Encounter (Signed)
-----   Message from Heath Lark, MD sent at 08/07/2018 11:57 AM EDT ----- Regarding: abnormal liver tests He has intermittent abnormal liver enzymes Did he drink any alcohol lately? If not, it would be related to his weight or high cholesterol I recommend close follow-up with repeat tests with PCP

## 2018-08-07 NOTE — Assessment & Plan Note (Signed)
The patient has lost some weight since last time I saw him I encouraged him to continue on dietary change and exercise

## 2018-08-07 NOTE — Progress Notes (Signed)
[facility] OFFICE PROGRESS NOTE  Patient Care Team: Mikey Kirschner, MD as PCP - General (Family Medicine) Fay Records, MD as Consulting Physician (Cardiology) Carola Frost, RN as Registered Nurse  ASSESSMENT & PLAN:  History of B-cell lymphoma Clinically, he had no signs of disease. PET CT scan from 2016 and CT from 04/30/16 showed complete remission. I will see him back in 12 months with history, physical examination and blood work.  We reviewed current guidelines. There is no benefit for future surveillance imaging unless he has signs and symptoms to suggest disease recurrence I recommend annual influenza vaccination  Elevated liver enzymes He has intermittent elevated liver enzymes, likely due to recent alcohol intake and possibly fatty liver disease. I recommend observation only  Morbid obesity due to excess calories ([place]) The patient has lost some weight since last time I saw him I encouraged him to continue on dietary change and exercise  Skin rash His skin rash is most consistent with psoriasis There is no contraindication for him to be treated by his dermatologist since he is off all treatment for lymphoma   Orders Placed This Encounter  Procedures  . Comprehensive metabolic panel    Standing Status:   Future    Standing Expiration Date:   09/11/2019  . CBC with Differential/Platelet    Standing Status:   Future    Standing Expiration Date:   09/11/2019    INTERVAL HISTORY: Please see below for problem oriented charting. He returns for further follow-up He denies recent infection, fever or chills No new lymphadenopathy He has lost some weight intentionally since last time I saw him He still bothered with psoriasis-  SUMMARY OF ONCOLOGIC HISTORY: Oncology History Overview Note  Diffuse large B cell lymphoma   Staging form: Lymphoid Neoplasms, AJCC 6th Edition     Clinical stage from 12/30/2013: Stage III - Signed by Heath Lark, MD on  01/08/2014     Pathologic: No stage assigned - Unsigned     History of B-cell lymphoma  12/14/2013 Imaging   CT scan of the abdomen and pelvis showed bulky left iliac chain and left inguinal adenopathy, highly worrisome for lymphoma.   12/22/2013 Surgery   He underwent excisional lymph node biopsy of the left inguinal region that confirm lymphoma diagnosis. No   12/22/2013 Pathology Results   Accession: [id number] pathology showed a high-grade follicular lymphoma with possibly foci of diffuse large B-cell lymphoma   01/04/2014 Bone Marrow Biopsy   BM biopsy was negative with normal cytogenetics   01/05/2014 Imaging   PET CT scan showed lymph nodes involvement of both sides of her diaphragm   01/06/2014 Surgery   He has port placed   01/07/2014 Imaging   ECHO showed preserved EF of 73%, mildly dilated atrium   01/12/2014 - 04/27/2014 Chemotherapy   He is received 6 cycles of R CHOP chemotherapy   02/02/2014 Adverse Reaction   Vincristine dose was reduced by 50% due to early signs of peripheral neuropathy   03/11/2014 Imaging   Repeat PET scan showed near complete response to treatment   06/07/2014 Imaging   PET scan showed complete response to treatment   04/30/2016 Imaging   CT: No acute findings. No evidence for residual or recurrent mass or adenopathy. 2. Aortic atherosclerosis 3. Hepatic steatosis 4. Gallstone.     REVIEW OF SYSTEMS:   Constitutional: Denies fevers, chills or abnormal weight loss Eyes: Denies blurriness of vision Ears, nose, mouth, throat, and face: Denies  mucositis or sore throat Respiratory: Denies cough, dyspnea or wheezes Cardiovascular: Denies palpitation, chest discomfort or lower extremity swelling Gastrointestinal:  Denies nausea, heartburn or change in bowel habits Skin: Denies abnormal skin rashes Lymphatics: Denies new lymphadenopathy or easy bruising Neurological:Denies numbness, tingling or new weaknesses Behavioral/Psych: Mood is stable, no  new changes  All other systems were reviewed with the patient and are negative.  I have reviewed the past medical history, past surgical history, social history and family history with the patient and they are unchanged from previous note.  ALLERGIES:  has No Known Allergies.  MEDICATIONS:  Current Outpatient Medications  Medication Sig Dispense Refill  . allopurinol (ZYLOPRIM) 100 MG tablet Take 1 tablet (100 mg total) by mouth daily. 90 tablet 1  . colchicine 0.6 MG tablet 2 pills now then 1 bid prn for gout flare up 20 tablet 3  . enalapril (VASOTEC) 20 MG tablet Take 1 tablet (20 mg total) by mouth every morning. 90 tablet 1  . hydrocortisone cream 1 % Apply 1 application topically as needed for itching.    . indomethacin (INDOCIN) 50 MG capsule Take 1 capsule (50 mg total) by mouth 3 (three) times daily as needed. 30 capsule 1  . naproxen sodium (ALEVE) 220 MG tablet Take 220 mg by mouth 2 (two) times daily as needed (for pain or headache).    . sildenafil (REVATIO) 20 MG tablet Take 2 tablets po as needed for sex 50 tablet 0   No current facility-administered medications for this visit.     PHYSICAL EXAMINATION: ECOG PERFORMANCE STATUS: 0 - Asymptomatic  Vitals:   08/07/18 0916  BP: 107/69  Pulse: 68  Resp: 17  Temp: (!) 97.5 F (36.4 C)  SpO2: 97%   Filed Weights   08/07/18 0916  Weight: 245 lb (111.1 kg)    GENERAL:alert, no distress and comfortable SKIN: Noted psoriatic plaque EYES: normal, Conjunctiva are pink and non-injected, sclera clear OROPHARYNX:no exudate, no erythema and lips, buccal mucosa, and tongue normal  NECK: supple, thyroid normal size, non-tender, without nodularity LYMPH:  no palpable lymphadenopathy in the cervical, axillary or inguinal LUNGS: clear to auscultation and percussion with normal breathing effort HEART: regular rate & rhythm and no murmurs and no lower extremity edema ABDOMEN:abdomen soft, non-tender and normal bowel  sounds Musculoskeletal:no cyanosis of digits and no clubbing  NEURO: alert & oriented x 3 with fluent speech, no focal motor/sensory deficits  LABORATORY DATA:  I have reviewed the data as listed    Component Value Date/Time   NA 138 08/07/2018 0902   NA 140 11/01/2016 0749   K 4.3 08/07/2018 0902   K 4.3 11/01/2016 0749   CL 104 08/07/2018 0902   CO2 23 08/07/2018 0902   CO2 27 11/01/2016 0749   GLUCOSE 101 (H) 08/07/2018 0902   GLUCOSE 103 11/01/2016 0749   BUN 16 08/07/2018 0902   BUN 20.4 11/01/2016 0749   CREATININE 0.97 08/07/2018 0902   CREATININE 0.97 10/22/2017 0755   CREATININE 1.0 11/01/2016 0749   CALCIUM 9.8 08/07/2018 0902   CALCIUM 10.2 11/01/2016 0749   PROT 7.6 08/07/2018 0902   PROT 7.4 11/01/2016 0749   ALBUMIN 4.0 08/07/2018 0902   ALBUMIN 4.0 11/01/2016 0749   AST 77 (H) 08/07/2018 0902   AST 41 (H) 11/01/2016 0749   ALT 119 (H) 08/07/2018 0902   ALT 65 (H) 11/01/2016 0749   ALKPHOS 63 08/07/2018 0902   ALKPHOS 58 11/01/2016 0749   BILITOT 0.6 08/07/2018  0902   BILITOT 0.79 11/01/2016 0749   GFRNONAA >60 08/07/2018 0902   GFRNONAA 83 10/22/2017 0755   GFRAA >60 08/07/2018 0902   GFRAA 97 10/22/2017 0755    No results found for: SPEP, UPEP  Lab Results  Component Value Date   WBC 5.5 08/07/2018   NEUTROABS 3.1 08/07/2018   HGB 15.7 08/07/2018   HCT 45.8 08/07/2018   MCV 97.7 08/07/2018   PLT 197 08/07/2018      Chemistry      Component Value Date/Time   NA 138 08/07/2018 0902   NA 140 11/01/2016 0749   K 4.3 08/07/2018 0902   K 4.3 11/01/2016 0749   CL 104 08/07/2018 0902   CO2 23 08/07/2018 0902   CO2 27 11/01/2016 0749   BUN 16 08/07/2018 0902   BUN 20.4 11/01/2016 0749   CREATININE 0.97 08/07/2018 0902   CREATININE 0.97 10/22/2017 0755   CREATININE 1.0 11/01/2016 0749      Component Value Date/Time   CALCIUM 9.8 08/07/2018 0902   CALCIUM 10.2 11/01/2016 0749   ALKPHOS 63 08/07/2018 0902   ALKPHOS 58 11/01/2016 0749    AST 77 (H) 08/07/2018 0902   AST 41 (H) 11/01/2016 0749   ALT 119 (H) 08/07/2018 0902   ALT 65 (H) 11/01/2016 0749   BILITOT 0.6 08/07/2018 0902   BILITOT 0.79 11/01/2016 0749       All questions were answered. The patient knows to call the clinic with any problems, questions or concerns. No barriers to learning was detected.  I spent 15 minutes counseling the patient face to face. The total time spent in the appointment was 20 minutes and more than 50% was on counseling and review of test results  Heath Lark, MD 08/07/2018 12:03 PM

## 2018-08-28 ENCOUNTER — Other Ambulatory Visit: Payer: Self-pay | Admitting: Family Medicine

## 2018-08-29 ENCOUNTER — Encounter: Payer: BLUE CROSS/BLUE SHIELD | Admitting: Family Medicine

## 2018-09-09 ENCOUNTER — Other Ambulatory Visit: Payer: Self-pay | Admitting: Family Medicine

## 2018-09-09 ENCOUNTER — Encounter: Payer: Self-pay | Admitting: Family Medicine

## 2018-09-09 ENCOUNTER — Other Ambulatory Visit: Payer: Self-pay

## 2018-09-09 ENCOUNTER — Ambulatory Visit: Payer: BC Managed Care – PPO | Admitting: Family Medicine

## 2018-09-09 VITALS — BP 130/76 | Temp 97.7°F | Ht 72.0 in | Wt 253.4 lb

## 2018-09-09 DIAGNOSIS — Z0000 Encounter for general adult medical examination without abnormal findings: Secondary | ICD-10-CM

## 2018-09-09 DIAGNOSIS — Z125 Encounter for screening for malignant neoplasm of prostate: Secondary | ICD-10-CM

## 2018-09-09 DIAGNOSIS — Z13220 Encounter for screening for lipoid disorders: Secondary | ICD-10-CM

## 2018-09-09 DIAGNOSIS — K7400 Hepatic fibrosis, unspecified: Secondary | ICD-10-CM

## 2018-09-09 DIAGNOSIS — M1000 Idiopathic gout, unspecified site: Secondary | ICD-10-CM

## 2018-09-09 DIAGNOSIS — R748 Abnormal levels of other serum enzymes: Secondary | ICD-10-CM

## 2018-09-09 DIAGNOSIS — I10 Essential (primary) hypertension: Secondary | ICD-10-CM | POA: Diagnosis not present

## 2018-09-09 MED ORDER — ENALAPRIL MALEATE 20 MG PO TABS: ORAL_TABLET | ORAL | 1 refills | Status: DC

## 2018-09-09 MED ORDER — ALLOPURINOL 100 MG PO TABS: 100.0000 mg | ORAL_TABLET | Freq: Every day | ORAL | 1 refills | Status: DC

## 2018-09-09 NOTE — Progress Notes (Signed)
Subjective:    Patient ID: Jared Bentley, male    DOB: 1955/06/24, 63 y.o.   MRN: [id number]  HPI The patient comes in today for a wellness visit.    A review of their health history was completed.  A review of medications was also completed.  Any needed refills; none at this time  Eating habits: good  Falls/  MVA accidents in past few months: none  Regular exercise: yes  Specialist pt sees on regular basis: cancer doctor  Preventative health issues were discussed.   Additional concerns: none  Pt states he does not check BP regular but BP has not been giving him any problems. Takes Enalapril 20 mg daily   Pt has psoriasis,   Was told by his oncologist,     Blood pressure medicine and blood pressure levels reviewed today with patient. Compliant with blood pressure medicine. States does not miss a dose. No obvious side effects. Blood pressure generally good when checked elsewhere. Watching salt intake.  Patient also has elevated liver enzymes.  Is continue to rise.  Patient admits to alcohol intake.  His oncologist was concerned and recommended he follow-up with [place].  Blood work in the past revealed no hepatitis.  Also known fatty liver.   Review of Systems  Constitutional: Negative for activity change, appetite change and fever.  HENT: Negative for congestion and rhinorrhea.   Eyes: Negative for discharge.  Respiratory: Negative for cough and wheezing.   Cardiovascular: Negative for chest pain.  Gastrointestinal: Negative for abdominal pain, blood in stool and vomiting.  Genitourinary: Negative for difficulty urinating and frequency.  Musculoskeletal: Negative for neck pain.  Skin: Negative for rash.  Allergic/Immunologic: Negative for environmental allergies and food allergies.  Neurological: Negative for weakness and headaches.  Psychiatric/Behavioral: Negative for agitation.  All other systems reviewed and are negative.      Objective:   Physical Exam  Constitutional:      Appearance: He is well-developed.  HENT:     Head: Normocephalic and atraumatic.     Right Ear: External ear normal.     Left Ear: External ear normal.     Nose: Nose normal.  Eyes:     Pupils: Pupils are equal, round, and reactive to light.  Neck:     Musculoskeletal: Normal range of motion and neck supple.     Thyroid: No thyromegaly.  Cardiovascular:     Rate and Rhythm: Normal rate and regular rhythm.     Heart sounds: Normal heart sounds. No murmur.  Pulmonary:     Effort: Pulmonary effort is normal. No respiratory distress.     Breath sounds: Normal breath sounds. No wheezing.  Abdominal:     General: Bowel sounds are normal. There is no distension.     Palpations: Abdomen is soft. There is no mass.     Tenderness: There is no abdominal tenderness.  Genitourinary:    Penis: Normal.   Musculoskeletal: Normal range of motion.  Lymphadenopathy:     Cervical: No cervical adenopathy.  Skin:    General: Skin is warm and dry.     Findings: No erythema.  Neurological:     Mental Status: He is alert.     Motor: No abnormal muscle tone.  Psychiatric:        Behavior: Behavior normal.        Judgment: Judgment normal.           Assessment & Plan:  Impression 1 wellness exam.  Diet  discussed.  Exercise discussed.  2.  History of lymphoma.  Followed by oncologist  3.  Rising liver enzymes with known fatty liver and alcohol use.  Will order elastography of the liver rationale discussed further recommendations based on results  4.  Hypertension good control discussed maintain same meds

## 2018-09-10 LAB — BASIC METABOLIC PANEL WITH GFR
BUN: 22 mg/dL (ref 7–25)
CO2: 29 mmol/L (ref 20–32)
Calcium: 9.9 mg/dL (ref 8.6–10.3)
Chloride: 101 mmol/L (ref 98–110)
Creat: 0.95 mg/dL (ref 0.70–1.25)
GFR, Est African American: 98 mL/min/{1.73_m2} (ref >=60)
GFR, Est Non African American: 85 mL/min/{1.73_m2} (ref >=60)
Glucose, Bld: 97 mg/dL (ref 65–99)
Potassium: 4.5 mmol/L (ref 3.5–5.3)
Sodium: 139 mmol/L (ref 135–146)

## 2018-09-10 LAB — HEPATIC FUNCTION PANEL
AG Ratio: 1.6 (calc) (ref 1.0–2.5)
ALT: 56 U/L — ABNORMAL HIGH (ref 9–46)
AST: 37 U/L — ABNORMAL HIGH (ref 10–35)
Albumin: 4.4 g/dL (ref 3.6–5.1)
Alkaline phosphatase (APISO): 49 U/L (ref 35–144)
Bilirubin, Direct: 0.1 mg/dL (ref 0.0–0.2)
Globulin: 2.7 g/dL (calc) (ref 1.9–3.7)
Indirect Bilirubin: 0.5 mg/dL (calc) (ref 0.2–1.2)
Total Bilirubin: 0.6 mg/dL (ref 0.2–1.2)
Total Protein: 7.1 g/dL (ref 6.1–8.1)

## 2018-09-10 LAB — LIPID PANEL
Cholesterol: 191 mg/dL (ref <200)
HDL: 67 mg/dL (ref >=40)
LDL Cholesterol (Calc): 109 mg/dL (calc) — ABNORMAL HIGH
Non-HDL Cholesterol (Calc): 124 mg/dL (calc) (ref <130)
Total CHOL/HDL Ratio: 2.9 (calc) (ref <5.0)
Triglycerides: 61 mg/dL (ref <150)

## 2018-09-10 LAB — PSA: PSA: 0.7 ng/mL (ref <=4.0)

## 2018-09-10 LAB — URIC ACID: Uric Acid, Serum: 7.1 mg/dL (ref 4.0–8.0)

## 2018-09-14 ENCOUNTER — Encounter: Payer: Self-pay | Admitting: Family Medicine

## 2018-09-22 ENCOUNTER — Other Ambulatory Visit: Payer: Self-pay

## 2018-09-22 ENCOUNTER — Ambulatory Visit (HOSPITAL_COMMUNITY)
Admission: RE | Admit: 2018-09-22 | Discharge: 2018-09-22 | Disposition: A | Discharge status: Home / Self Care | Payer: BC Managed Care – PPO | Source: Ambulatory Visit | Attending: Family Medicine | Admitting: Family Medicine

## 2018-09-22 DIAGNOSIS — R748 Abnormal levels of other serum enzymes: Secondary | ICD-10-CM | POA: Diagnosis not present

## 2018-09-26 ENCOUNTER — Telehealth: Payer: Self-pay | Admitting: Family Medicine

## 2018-09-26 NOTE — Telephone Encounter (Signed)
Pt is returning call to nurse. I looked through pt's chart best I could but didn't see where one had called.

## 2018-09-26 NOTE — Telephone Encounter (Signed)
Results discussed with patient. Patient advised  the ultrasound revealed changes which show a moderate potential for fibrosis in the liver which could suggest an element of cirrhosis. When we fine this on the test we recommend GI referral. Patient verbalized understanding. Referral ordered in Epic.

## 2018-09-26 NOTE — Addendum Note (Signed)
Addended by: Dairl Ponder on: 09/26/2018 05:10 PM   Modules accepted: Orders

## 2018-09-29 ENCOUNTER — Encounter: Payer: Self-pay | Admitting: Family Medicine

## 2018-10-01 ENCOUNTER — Encounter: Payer: Self-pay | Admitting: Internal Medicine

## 2018-10-03 ENCOUNTER — Telehealth: Payer: Self-pay | Admitting: Family Medicine

## 2018-10-03 NOTE — Telephone Encounter (Signed)
Patient calling to check to see if we had received a request for a presciption for c-pap supplies.  I told the patient I don't see anything in the chart, or in the fax request or in the provider's office.  He said he would call the company and get them to resend it.

## 2018-10-08 ENCOUNTER — Telehealth: Payer: Self-pay | Admitting: Family Medicine

## 2018-10-08 NOTE — Telephone Encounter (Signed)
Pt states that [facility] has faxed over a form that is needing to be signed so he can get his CPAP machine. Pt states the pressure setting is 12. Unable to locate the form at this time. [place] and they are faxing the paper over once again.

## 2018-10-09 NOTE — Telephone Encounter (Signed)
[place] and I have been working on getting this patient his form. Izora Ribas has spoke with someone at [facility] also. Routing to [place] so she can make her notes. [place]!!!!!!!!

## 2018-10-09 NOTE — Telephone Encounter (Signed)
Still have not received fax from [facility] regarding patient CPAP machine. Contacted patient to let him know. Pt is going to call himself and try to get it faxed to office

## 2018-10-14 NOTE — Telephone Encounter (Signed)
Blake Divine with [employer] is helping get [place] the forms we need

## 2018-10-27 NOTE — Telephone Encounter (Signed)
Orders faxed

## 2018-10-29 ENCOUNTER — Other Ambulatory Visit: Payer: Self-pay

## 2018-10-29 ENCOUNTER — Ambulatory Visit: Payer: BC Managed Care – PPO | Admitting: Gastroenterology

## 2018-10-29 ENCOUNTER — Encounter: Payer: Self-pay | Admitting: Gastroenterology

## 2018-10-29 VITALS — BP 112/74 | HR 72 | Temp 96.8°F | Ht 72.0 in | Wt 244.0 lb

## 2018-10-29 DIAGNOSIS — R748 Abnormal levels of other serum enzymes: Secondary | ICD-10-CM | POA: Diagnosis not present

## 2018-10-29 NOTE — Patient Instructions (Signed)
1. Please have labs done at your convenience. We will contact you with results within 5-7 business days.  2. Continue to cut back on alcohol use with goal of stopping completely.  3. Continue efforts for slow gradual weight loss.

## 2018-10-29 NOTE — Progress Notes (Signed)
Primary Care Physician: Mikey Kirschner, MD  Primary Gastroenterologist:  Garfield Cornea, MD   Chief Complaint  Patient presents with  . Elevated Hepatic Enzymes    HPI: Jared Bentley is a 63 y.o. male here at the request of Dr. Mickie Hillier for further evaluation of abnormal LFTs.  Patient has a history of diffuse large B-cell lymphoma diagnosed in 2015.  Notes that his liver enzymes were first noted to be elevated in May 2015, AST 63, ALT 113.  December 2015 his AST was 31, ALT 69.  Admittedly historically has consumed excessive amount of beer daily.  Cut way back on alcohol consumption when he was diagnosed with lymphoma in December 2015.  LFTs actually normalized while on chemo.  He notes that he essentially stopped drinking during this time.  After he completed therapy he started drinking again.  He noted his numbers increased again September 2017.  In 2015 viral markers negative for hepatitis B and C.   Since retirement he has been drinking more than usual.  Up to fifteen 12 ounce beers per day.  July 2020 his AST was 77, ALT 119.  He started cutting back on alcohol use.  In August 2020 his AST was 37, ALT 56.  He is down to 24 ounces of beer daily.  He has dropped about 8 pounds.  Ultrasound with elastography August 2020 showed diffuse hepatic steatosis, Metavir fibrosis score F2 with some F3.  CT abdomen pelvis with contrast March 2018, hepatic steatosis, small gallstone, 2 small low-attenuation structures which measure 4 mm seen within the liver.  He feels well.  Energy level is good.  No abdominal pain.  No nausea or vomiting.  No acid reflux.  Bowel movements are regular.  No blood in stool or melena.   Component     Latest Ref Rng & Units 05/03/2017 08/29/2017 10/22/2017 08/07/2018  AST     10 - 35 U/L 33 37 (H)  77 (H)  ALT     9 - 46 U/L 47 55 (H)  119 (H)   Component     Latest Ref Rng & Units 09/09/2018  AST     10 - 35 U/L 37 (H)  ALT     9 - 46 U/L 56 (H)      Current Outpatient Medications  Medication Sig Dispense Refill  . allopurinol (ZYLOPRIM) 100 MG tablet Take 1 tablet (100 mg total) by mouth daily. 90 tablet 1  . colchicine 0.6 MG tablet 2 pills now then 1 bid prn for gout flare up 20 tablet 3  . enalapril (VASOTEC) 20 MG tablet TAKE 1 TABLET BY MOUTH EVERY DAY IN THE MORNING 90 tablet 1  . hydrocortisone cream 1 % Apply 1 application topically as needed for itching.    . naproxen sodium (ALEVE) 220 MG tablet Take 220 mg by mouth 2 (two) times daily as needed (for pain or headache).    . sildenafil (REVATIO) 20 MG tablet Take 2 tablets po as needed for sex 50 tablet 0  . indomethacin (INDOCIN) 50 MG capsule Take 1 capsule (50 mg total) by mouth 3 (three) times daily as needed. (Patient not taking: Reported on 09/09/2018) 30 capsule 1   No current facility-administered medications for this visit.     Allergies as of 10/29/2018  . (No Known Allergies)   Past Medical History:  Diagnosis Date  . Anxiety   . Carpal tunnel syndrome   . Chronic back  pain   . Diffuse large B cell lymphoma ([place]) 12/30/2013  . ED (erectile dysfunction)   . Glucose intolerance (impaired glucose tolerance)   . Heavy alcohol consumption    6-12 beers daily  . Hypertension   . Sleep apnea    severe OSA-uses a cpap   Past Surgical History:  Procedure Laterality Date  . CARPAL TUNNEL RELEASE Left 07/29/2014   Procedure: CARPAL TUNNEL RELEASE;  Surgeon: Sanjuana Kava, MD;  Location: [facility];  Service: Orthopedics;  Laterality: Left;  . CARPAL TUNNEL RELEASE Right 11/16/2014   Procedure: CARPAL TUNNEL RELEASE;  Surgeon: Sanjuana Kava, MD;  Location: [facility];  Service: Orthopedics;  Laterality: Right;  . COLONOSCOPY W/ POLYPECTOMY    . COLONOSCOPY WITH PROPOFOL N/A 01/23/2017   Dr. Gala Romney: normal colon.  Next colonoscopy in 10 years  . LYMPH NODE BIOPSY Left 12/22/2013   Procedure: LEFT INGUINAL LYMPH NODE BIOPSY;  Surgeon: [name], MD;  Location: [facility];  Service: General;  Laterality: Left;  . PORT-A-CATH REMOVAL N/A 07/08/2014   Procedure: REMOVAL PORT-A-CATH;  Surgeon: [name], MD;  Location: [facility];  Service: General;  Laterality: N/A;  . PORTACATH PLACEMENT N/A 01/06/2014   Procedure: INSERTION PORT-A-CATH;  Surgeon: [name], MD;  Location: [facility];  Service: General;  Laterality: N/A;  . VASECTOMY    . WISDOM TOOTH EXTRACTION     Family History  Problem Relation Age of Onset  . Cancer Father        bone  . Cancer Mother        tongue ca  . Colon cancer Neg Hx    Social History   Tobacco Use  . Smoking status: Former Smoker    Packs/day: 1.00    Years: 30.00    Pack years: 30.00    Quit date: 05/14/2004    Years since quitting: 14.4  . Smokeless tobacco: Never Used  Substance Use Topics  . Alcohol use: Yes    Comment: daily-6-12 beer; 10/29/18 "2 beers/day  . Drug use: No    ROS:  General: Negative for anorexia, weight loss, fever, chills, fatigue, weakness. ENT: Negative for hoarseness, difficulty swallowing , nasal congestion. CV: Negative for chest pain, angina, palpitations, dyspnea on exertion, peripheral edema.  Respiratory: Negative for dyspnea at rest, dyspnea on exertion, cough, sputum, wheezing.  GI: See history of present illness. GU:  Negative for dysuria, hematuria, urinary incontinence, urinary frequency, nocturnal urination.  Endo: Negative for unusual weight change.    Physical Examination:   BP 112/74   Pulse 72   Temp (!) 96.8 F (36 C) (Temporal)   Ht 6' (1.829 m)   Wt 244 lb (110.7 kg)   BMI 33.09 kg/m   General: Well-nourished, well-developed in no acute distress.  Eyes: No icterus. Mouth: Oropharyngeal mucosa moist and pink , no lesions erythema or exudate. Lungs: Clear to auscultation bilaterally.  Heart: Regular rate and rhythm, no murmurs rubs or gallops.  Abdomen: Bowel sounds are normal, nontender, nondistended, no hepatosplenomegaly or  masses, no abdominal bruits or hernia , no rebound or guarding.   Extremities: No lower extremity edema. No clubbing or deformities. Neuro: Alert and oriented x 4   Skin: Warm and dry, no jaundice.   Psych: Alert and cooperative, normal mood and affect.  Labs:  Lab Results  Component Value Date   ALT 56 (H) 09/09/2018   AST 37 (H) 09/09/2018   ALKPHOS 63 08/07/2018   BILITOT 0.6 09/09/2018  Lab Results  Component Value Date   CREATININE 0.95 09/09/2018   BUN 22 09/09/2018   NA 139 09/09/2018   K 4.5 09/09/2018   CL 101 09/09/2018   CO2 29 09/09/2018   Lab Results  Component Value Date   WBC 5.5 08/07/2018   HGB 15.7 08/07/2018   HCT 45.8 08/07/2018   MCV 97.7 08/07/2018   PLT 197 08/07/2018   Lab Results  Component Value Date   PSA 0.7 09/09/2018   PSA 0.8 08/29/2017   PSA 0.8 08/29/2016    Imaging Studies: No results found.

## 2018-10-30 ENCOUNTER — Encounter: Payer: Self-pay | Admitting: Gastroenterology

## 2018-10-30 NOTE — Assessment & Plan Note (Signed)
Very pleasant 63 year old gentleman with history of chronically elevated transaminases in the setting of known significant daily alcohol consumption.  He has noted himself that in the past when he stops drinking his numbers normalize.  He is fully aware that he needs to quit drinking.  He is already made significant progress in cutting back on alcohol consumption.  His transaminases have already improved.  CT imaging 2 years ago with hepatic steatosis.  Ultrasound with elastography recently again showing diffuse hepatic steatosis, metavir fibrosis score F2 with some F3 indicating risk for fibrosis.  Discussed with patient at length, if he quits drinking he could have some reversal of fibrosis.  Fortunately there is no indication of cirrhosis at this point.  We will rule out some other common etiologies for liver disease in the interim.  Encouraged ongoing weight management which will also help with this hepatic steatosis.

## 2018-10-31 LAB — IGG, IGA, IGM
IgG (Immunoglobin G), Serum: 1040 mg/dL (ref 600–1540)
IgM, Serum: 58 mg/dL (ref 50–300)
Immunoglobulin A: 339 mg/dL — ABNORMAL HIGH (ref 70–320)

## 2018-10-31 LAB — HEPATITIS B CORE ANTIBODY, TOTAL: Hep B Core Total Ab: NONREACTIVE

## 2018-10-31 LAB — ANA: Anti Nuclear Antibody (ANA): NEGATIVE

## 2018-10-31 LAB — IRON,TIBC AND FERRITIN PANEL
%SAT: 30 % (calc) (ref 20–48)
Ferritin: 358 ng/mL (ref 24–380)
Iron: 90 ug/dL (ref 50–180)
TIBC: 298 mcg/dL (calc) (ref 250–425)

## 2018-10-31 LAB — HEPATITIS B SURFACE ANTIBODY,QUALITATIVE: Hep B S Ab: NONREACTIVE

## 2018-10-31 LAB — ANTI-SMOOTH MUSCLE ANTIBODY, IGG: Actin (Smooth Muscle) Antibody (IGG): <20 U (ref <20)

## 2018-10-31 LAB — MITOCHONDRIAL ANTIBODIES: Mitochondrial M2 Ab, IgG: <=20 U

## 2018-10-31 LAB — TISSUE TRANSGLUTAMINASE, IGA: (tTG) Ab, IgA: 1 U/mL

## 2018-10-31 LAB — HEPATITIS A ANTIBODY, TOTAL: Hepatitis A AB,Total: NONREACTIVE

## 2018-11-18 ENCOUNTER — Other Ambulatory Visit: Payer: Self-pay | Admitting: Gastroenterology

## 2018-11-18 ENCOUNTER — Telehealth: Payer: Self-pay | Admitting: Gastroenterology

## 2018-11-18 DIAGNOSIS — R748 Abnormal levels of other serum enzymes: Secondary | ICD-10-CM

## 2018-11-18 NOTE — Telephone Encounter (Signed)
Patient sent mychart message stating he wanted to go for LFTs now.  I placed order at [facility]. Please make sure it was released.   Patient still needs to have NIC for abd u/s with elastography in 09/2019.

## 2018-11-18 NOTE — Progress Notes (Signed)
Patient wants to go for LFTs now.   U/S with elastography in 09/2019 still needs to be NIC'd.

## 2018-11-18 NOTE — Telephone Encounter (Signed)
Lab work was released.  NIC for abd u/s with elastography in 09/2019.

## 2018-11-19 LAB — HEPATIC FUNCTION PANEL
AG Ratio: 1.7 (calc) (ref 1.0–2.5)
ALT: 25 U/L (ref 9–46)
AST: 26 U/L (ref 10–35)
Albumin: 4.4 g/dL (ref 3.6–5.1)
Alkaline phosphatase (APISO): 56 U/L (ref 35–144)
Bilirubin, Direct: 0.2 mg/dL (ref 0.0–0.2)
Globulin: 2.6 g/dL (calc) (ref 1.9–3.7)
Indirect Bilirubin: 0.5 mg/dL (calc) (ref 0.2–1.2)
Total Bilirubin: 0.7 mg/dL (ref 0.2–1.2)
Total Protein: 7 g/dL (ref 6.1–8.1)

## 2018-11-20 ENCOUNTER — Encounter: Payer: Self-pay | Admitting: Gastroenterology

## 2018-11-20 NOTE — Telephone Encounter (Signed)
Reminder in epic °

## 2018-11-20 NOTE — Progress Notes (Signed)
PATIENT SCHEDULED AND LETTER SENT  °

## 2019-03-12 ENCOUNTER — Encounter: Payer: Self-pay | Admitting: Family Medicine

## 2019-03-12 ENCOUNTER — Other Ambulatory Visit: Payer: Self-pay

## 2019-03-12 ENCOUNTER — Ambulatory Visit (INDEPENDENT_AMBULATORY_CARE_PROVIDER_SITE_OTHER): Payer: BC Managed Care – PPO | Admitting: Family Medicine

## 2019-03-12 DIAGNOSIS — M1000 Idiopathic gout, unspecified site: Secondary | ICD-10-CM

## 2019-03-12 DIAGNOSIS — I10 Essential (primary) hypertension: Secondary | ICD-10-CM | POA: Diagnosis not present

## 2019-03-12 MED ORDER — COLCHICINE 0.6 MG PO TABS: ORAL_TABLET | ORAL | 3 refills | Status: DC

## 2019-03-12 MED ORDER — ALLOPURINOL 100 MG PO TABS: 100.0000 mg | ORAL_TABLET | Freq: Every day | ORAL | 1 refills | Status: DC

## 2019-03-12 MED ORDER — ENALAPRIL MALEATE 20 MG PO TABS: ORAL_TABLET | ORAL | 1 refills | Status: DC

## 2019-03-12 NOTE — Progress Notes (Signed)
   Subjective:  Audio  Patient ID: Jared Bentley, male    DOB: August 04, 1955, 64 y.o.   MRN: [id number]  Hypertension This is a chronic problem. The current episode started more than 1 year ago. Risk factors for coronary artery disease include male gender. Treatments tried: vasotec. There are no compliance problems.    Blood pressure medicine and blood pressure levels reviewed today with patient. Compliant with blood pressure medicine. States does not miss a dose. No obvious side effects. Blood pressure generally good when checked elsewhere. Watching salt intake.      Review of Systems  Virtual Visit via Video Note  I connected with Jared Bentley on 03/12/19 at  9:00 AM EST by a video enabled telemedicine application and verified that I am speaking with the correct person using two identifiers.  Location: Patient: home Provider: office   I discussed the limitations of evaluation and management by telemedicine and the availability of in person appointments. The patient expressed understanding and agreed to proceed.  History of Present Illness:    Observations/Objective:   Assessment and Plan:   Follow Up Instructions:    I discussed the assessment and treatment plan with the patient. The patient was provided an opportunity to ask questions and all were answered. The patient agreed with the plan and demonstrated an understanding of the instructions.   The patient was advised to call back or seek an in-person evaluation if the symptoms worsen or if the condition fails to improve as anticipated.  I provided 20 minutes of non-face-to-face time during this encounter.  Blood pressure medicine and blood pressure levels reviewed today with patient. Compliant with blood pressure medicine. States does not miss a dose. No obvious side effects. Blood pressure generally good when checked elsewhere. Watching salt intake.  Patient has been working with the GI doctors in regards to elevated  liver enzyme test.  He has worked hard on cutting back alcohol and now reports that his liver enzymes have normalized.  In the chart the GI doctors have him on a call back next fall for repeat elastography of his liver  exerci No headache no chest pain no shortness of breath    Objective:   Physical Exam  Virtual      Assessment & Plan:  Impression hypertension very good control discussed maintain same as  2.  Gout.  Clinically stable.  No recurrences  3.  Elevated liver enzymes with hepatic involvement, exacerbated by alcohol, with liver enzymes normalized at last test  Diet exercise discussed and encouraged medications refilled follow-up in 6 months

## 2019-05-25 ENCOUNTER — Ambulatory Visit (INDEPENDENT_AMBULATORY_CARE_PROVIDER_SITE_OTHER): Payer: BC Managed Care – PPO | Admitting: Gastroenterology

## 2019-05-25 ENCOUNTER — Other Ambulatory Visit: Payer: Self-pay

## 2019-05-25 ENCOUNTER — Encounter: Payer: Self-pay | Admitting: Gastroenterology

## 2019-05-25 VITALS — BP 104/68 | HR 55 | Temp 97.1°F | Ht 72.0 in | Wt 210.8 lb

## 2019-05-25 DIAGNOSIS — R748 Abnormal levels of other serum enzymes: Secondary | ICD-10-CM | POA: Diagnosis not present

## 2019-05-25 DIAGNOSIS — K760 Fatty (change of) liver, not elsewhere classified: Secondary | ICD-10-CM | POA: Diagnosis not present

## 2019-05-25 NOTE — Progress Notes (Signed)
Primary Care Physician: Mikey Kirschner, MD  Primary Gastroenterologist:  Garfield Cornea, MD   Chief Complaint  Patient presents with  . elevated LFT's    currently down to 2 beers per day    HPI: Jared Bentley is a 64 y.o. male here for follow-up.  Last seen in September 2020.  Patient has a history of chronically elevated transaminases in the setting of known significant daily alcohol consumption.  Patient previously noted when he stopped drinking that his numbers normalized.  This occurred in 2015 he was diagnosed with large B-cell lymphoma and was undergoing treatment.  When patient was last seen he admittedly was drinking much more than usual since his retirement.  Up to fifteen 12 ounce beers per day.  By the time I saw him he was down to 24 ounces of beer daily and had some improvement in his AST ALT as well as 8 pound weight loss. Ultrasound with elastography in August 2020 showed diffuse hepatic steatosis, Metavir fibrosis score F2 with some F3.    Labs back in September showed no iron overload, work-up for autoimmune processes unremarkable, celiac serologies negative.  He was advised to receive hepatitis a and B vaccinations.  Hepatitis C antibody negative in 2015.We repeated his LFTs in October after patient had made significant reductions in alcohol consumption.  His LFTs were normal.  Patient feels good. Some days no etoh. Other days, limits to 1-2 twelve ounce beers. He has lost 40 pounds in the past one year and weight has been stable at 200 pounds when he weighs without clothes at home. He contributes weight loss to beer reduction. He has more energy. Moving a lot better. No abdominal pain. No heartburn. BM regular. No melena, brbpr.   States he has full set of labs coming up in 08/2019 with his oncologist.    Wt Readings from Last 3 Encounters:  05/25/19 210 lb 12.8 oz (95.6 kg)  10/29/18 244 lb (110.7 kg)  09/09/18 253 lb 6.4 oz (114.9 kg)     Component  Latest Ref Rng & Units 05/03/2017 08/29/2017 08/07/2018 09/09/2018  AST     10 - 35 U/L 33 37 (H) 77 (H) 37 (H)  ALT     9 - 46 U/L 47 55 (H) 119 (H) 56 (H)   Component     Latest Ref Rng & Units 11/19/2018  AST     10 - 35 U/L 26  ALT     9 - 46 U/L 25      Current Outpatient Medications  Medication Sig Dispense Refill  . allopurinol (ZYLOPRIM) 100 MG tablet Take 1 tablet (100 mg total) by mouth daily. 90 tablet 1  . colchicine 0.6 MG tablet 2 pills now then 1 bid prn for gout flare up 20 tablet 3  . enalapril (VASOTEC) 20 MG tablet TAKE 1 TABLET BY MOUTH EVERY DAY IN THE MORNING (Patient taking differently: Take 10 mg by mouth daily. ) 90 tablet 1  . hydrocortisone cream 1 % Apply 1 application topically as needed for itching.    . naproxen sodium (ALEVE) 220 MG tablet Take 220 mg by mouth 2 (two) times daily as needed (for pain or headache).    . sildenafil (REVATIO) 20 MG tablet Take 2 tablets po as needed for sex 50 tablet 0   No current facility-administered medications for this visit.    Allergies as of 05/25/2019  . (No Known Allergies)    ROS:  General: Negative for anorexia, weight loss, fever, chills, fatigue, weakness. ENT: Negative for hoarseness, difficulty swallowing , nasal congestion. CV: Negative for chest pain, angina, palpitations, dyspnea on exertion, peripheral edema.  Respiratory: Negative for dyspnea at rest, dyspnea on exertion, cough, sputum, wheezing.  GI: See history of present illness. GU:  Negative for dysuria, hematuria, urinary incontinence, urinary frequency, nocturnal urination.  Endo: Negative for unusual weight change.    Physical Examination:   BP 104/68   Pulse (!) 55   Temp (!) 97.1 F (36.2 C) (Oral)   Ht 6' (1.829 m)   Wt 210 lb 12.8 oz (95.6 kg)   BMI 28.59 kg/m   General: Well-nourished, well-developed in no acute distress.  Eyes: No icterus. Mouth: masked Lungs: Clear to auscultation bilaterally.  Heart: Regular rate and  rhythm, no murmurs rubs or gallops.  Abdomen: Bowel sounds are normal, nontender, nondistended, no hepatosplenomegaly or masses, no abdominal bruits or hernia , no rebound or guarding.   Extremities: No lower extremity edema. No clubbing or deformities. Neuro: Alert and oriented x 4   Skin: Warm and dry, no jaundice.   Psych: Alert and cooperative, normal mood and affect.  Labs:  Lab Results  Component Value Date   ALT 25 11/19/2018   AST 26 11/19/2018   ALKPHOS 63 08/07/2018   BILITOT 0.7 11/19/2018   Lab Results  Component Value Date   CREATININE 0.95 09/09/2018   BUN 22 09/09/2018   NA 139 09/09/2018   K 4.5 09/09/2018   CL 101 09/09/2018   CO2 29 09/09/2018   Lab Results  Component Value Date   WBC 5.5 08/07/2018   HGB 15.7 08/07/2018   HCT 45.8 08/07/2018   MCV 97.7 08/07/2018   PLT 197 08/07/2018    Imaging Studies: No results found.

## 2019-05-25 NOTE — Patient Instructions (Signed)
1. I will follow up with you after your labs in 08/2019 and let you know next step in your care.  2. Please continue to try to cut back on alcohol use. Consider trying no alcohol beer.

## 2019-05-25 NOTE — Assessment & Plan Note (Signed)
History of chronically elevated transaminases in the setting of significant daily alcohol consumption.  Patient has normalization of his LFTs when he cuts way back on his alcohol use.  Ultrasound with elastography last August showed Metavir versus score of F2 with some F3 indicating risk for fibrosis.  Discussed with patient today, very pleased with his weight loss, alcohol reduction, normalization of his LFTs however with having increased risk for fibrosis, there really is no safe amount of alcohol for him.  Encouraged him to try non-alcoholic beers if he needs flavor of beer.  Encouraged him to continue to cut back with goal of quitting all forms of alcohol.  We will follow-up with him after he has his labs in July.  We consider repeat ultrasound with elastography in the next 6 months and he will need to continue with follow-up with [place] for his liver issues.  Patient voiced understanding.

## 2019-05-26 NOTE — Progress Notes (Signed)
Cc'ed to pcp °

## 2019-05-29 ENCOUNTER — Telehealth: Payer: Self-pay | Admitting: *Deleted

## 2019-05-29 ENCOUNTER — Telehealth (INDEPENDENT_AMBULATORY_CARE_PROVIDER_SITE_OTHER): Payer: BC Managed Care – PPO | Admitting: Family Medicine

## 2019-05-29 ENCOUNTER — Other Ambulatory Visit: Payer: Self-pay

## 2019-05-29 DIAGNOSIS — I10 Essential (primary) hypertension: Secondary | ICD-10-CM

## 2019-05-29 MED ORDER — ENALAPRIL MALEATE 10 MG PO TABS: ORAL_TABLET | ORAL | 1 refills | Status: DC

## 2019-05-29 NOTE — Progress Notes (Signed)
   Subjective:  Audio only  Patient ID: [name], male    DOB: [birth year], 64 y.o.   MRN: [id number]  HPIpt was having dizziness about 2 months ago. Would happen when he would stand good. He has lost 84 lbs in the past year. Weighs about 200 lbs now.  He deceased his enal05/14/2024mg to one half tablet daily instead of one whole tablet about one month ago. States he checks bp every day and runs around 111/66.  Virtual Visit via Telephone Note  I connected with [name] on 05/29/19 at  8:40 AM EDT by telephone and verified that I am speaking with the correct person using two identifiers.  Location: Patient: home Provider: office   I discussed the limitations, risks, security and privacy concerns of performing an evaluation and management service by telephone and the availability of in person appointments. I also discussed with the patient that there may be a patient responsible charge related to this service. The patient expressed understanding and agreed to proceed.   History of Present Illness:    Observations/Objective:   Assessment and Plan:   Follow Up Instructions:    I discussed the assessment and treatment plan with the patient. The patient was provided an opportunity to ask questions and all were answered. The patient agreed with the plan and demonstrated an understanding of the instructions.   The patient was advised to call back or seek an in-person evaluation if the symptoms worsen or if the condition fails to improve as anticipated.  I provided 22 minutes of non-face-to-face time during this encounter.        Review of Systems No headache no chest pain no shortness of breath    Objective:   Physical Exam   Virtual     Assessment & Plan:  Impression 1 hypertension.  Overly tight control.  Patient has lost weight with dietary efforts.  Now experiencing orthostatic symptoms with blood pressure agent.  Patient to cut dose in 1/2 to 10 mg, in fact  already has cut this and has achieved good control numbers without the symptoms.  Medications refilled also

## 2019-05-29 NOTE — Telephone Encounter (Signed)
Mr. jorah, grosse are scheduled for a virtual visit with your provider today.    Just as we do with appointments in the office, we must obtain your consent to participate.  Your consent will be active for this visit and any virtual visit you may have with one of our providers in the next 365 days.    If you have a MyChart account, I can also send a copy of this consent to you electronically.  All virtual visits are billed to your insurance company just like a traditional visit in the office.  As this is a virtual visit, video technology does not allow for your provider to perform a traditional examination.  This may limit your provider's ability to fully assess your condition.  If your provider identifies any concerns that need to be evaluated in person or the need to arrange testing such as labs, EKG, etc, we will make arrangements to do so.    Although advances in technology are sophisticated, we cannot ensure that it will always work on either your end or our end.  If the connection with a video visit is poor, we may have to switch to a telephone visit.  With either a video or telephone visit, we are not always able to ensure that we have a secure connection.   I need to obtain your verbal consent now.   Are you willing to proceed with your visit today?   Jalynn EYDAN TURTON has provided verbal consent on 05/29/2019 for a virtual visit (video or telephone).   Dayton Bailiff, LPN [id number]  [phone]

## 2019-07-13 ENCOUNTER — Telehealth: Payer: Self-pay | Admitting: Gastroenterology

## 2019-07-13 NOTE — Telephone Encounter (Signed)
Letter mailed

## 2019-07-13 NOTE — Telephone Encounter (Signed)
PATIENT ON AUGUST RECALL FOR ULTRASOUND

## 2019-07-14 ENCOUNTER — Telehealth: Payer: Self-pay | Admitting: Gastroenterology

## 2019-07-14 DIAGNOSIS — R748 Abnormal levels of other serum enzymes: Secondary | ICD-10-CM

## 2019-07-14 NOTE — Telephone Encounter (Signed)
[place] abd w/elastography scheduled for 09/24/19 at 9:30am, arrive at 9:15am. NPO after midnight prior to test. Letter mailed to pt and MyChart message sent to inform him of appt.

## 2019-07-14 NOTE — Telephone Encounter (Signed)
Pt said he was returning a call from MB. MB had mailed him a letter yesterday about scheduling his [place]. [phone]

## 2019-08-07 ENCOUNTER — Inpatient Hospital Stay: Payer: BC Managed Care – PPO | Attending: Hematology and Oncology | Admitting: Hematology and Oncology

## 2019-08-07 ENCOUNTER — Inpatient Hospital Stay: Payer: BC Managed Care – PPO

## 2019-08-07 ENCOUNTER — Other Ambulatory Visit: Payer: Self-pay

## 2019-08-07 ENCOUNTER — Encounter: Payer: Self-pay | Admitting: Hematology and Oncology

## 2019-08-07 DIAGNOSIS — Z9221 Personal history of antineoplastic chemotherapy: Secondary | ICD-10-CM | POA: Diagnosis not present

## 2019-08-07 DIAGNOSIS — C8330 Diffuse large B-cell lymphoma, unspecified site: Secondary | ICD-10-CM | POA: Insufficient documentation

## 2019-08-07 DIAGNOSIS — R42 Dizziness and giddiness: Secondary | ICD-10-CM | POA: Diagnosis not present

## 2019-08-07 DIAGNOSIS — Z8572 Personal history of non-Hodgkin lymphomas: Secondary | ICD-10-CM

## 2019-08-07 DIAGNOSIS — I700 Atherosclerosis of aorta: Secondary | ICD-10-CM | POA: Diagnosis not present

## 2019-08-07 DIAGNOSIS — Z79899 Other long term (current) drug therapy: Secondary | ICD-10-CM | POA: Diagnosis not present

## 2019-08-07 LAB — COMPREHENSIVE METABOLIC PANEL
ALT: 16 U/L (ref 0–44)
AST: 17 U/L (ref 15–41)
Albumin: 3.8 g/dL (ref 3.5–5.0)
Alkaline Phosphatase: 74 U/L (ref 38–126)
Anion gap: 10 (ref 5–15)
BUN: 20 mg/dL (ref 8–23)
CO2: 26 mmol/L (ref 22–32)
Calcium: 9.6 mg/dL (ref 8.9–10.3)
Chloride: 105 mmol/L (ref 98–111)
Creatinine, Ser: 0.83 mg/dL (ref 0.61–1.24)
GFR calc Af Amer: >60 mL/min (ref >60)
GFR calc non Af Amer: >60 mL/min (ref >60)
Glucose, Bld: 100 mg/dL — ABNORMAL HIGH (ref 70–99)
Potassium: 4 mmol/L (ref 3.5–5.1)
Sodium: 141 mmol/L (ref 135–145)
Total Bilirubin: 0.4 mg/dL (ref 0.3–1.2)
Total Protein: 6.9 g/dL (ref 6.5–8.1)

## 2019-08-07 LAB — CBC WITH DIFFERENTIAL/PLATELET
Abs Immature Granulocytes: 0.01 10*3/uL (ref 0.00–0.07)
Basophils Absolute: 0 10*3/uL (ref 0.0–0.1)
Basophils Relative: 1 %
Eosinophils Absolute: 0.2 10*3/uL (ref 0.0–0.5)
Eosinophils Relative: 3 %
HCT: 42.9 % (ref 39.0–52.0)
Hemoglobin: 14.4 g/dL (ref 13.0–17.0)
Immature Granulocytes: 0 %
Lymphocytes Relative: 31 %
Lymphs Abs: 1.7 10*3/uL (ref 0.7–4.0)
MCH: 32 pg (ref 26.0–34.0)
MCHC: 33.6 g/dL (ref 30.0–36.0)
MCV: 95.3 fL (ref 80.0–100.0)
Monocytes Absolute: 0.6 10*3/uL (ref 0.1–1.0)
Monocytes Relative: 10 %
Neutro Abs: 3 10*3/uL (ref 1.7–7.7)
Neutrophils Relative %: 55 %
Platelets: 194 10*3/uL (ref 150–400)
RBC: 4.5 MIL/uL (ref 4.22–5.81)
RDW: 12.7 % (ref 11.5–15.5)
WBC: 5.5 10*3/uL (ref 4.0–10.5)
nRBC: 0 % (ref 0.0–0.2)

## 2019-08-07 NOTE — Progress Notes (Signed)
[facility] OFFICE PROGRESS NOTE  Patient Care Team: Merlyn Albert, MD as PCP - General (Family Medicine) Pricilla Riffle, MD as Consulting Physician (Cardiology) Cira Rue, RN as Registered Nurse  ASSESSMENT & PLAN:  History of B-cell lymphoma Clinically, he had no signs of disease. PET CT scan from 2016 and CT from 04/30/16 showed complete remission. We discussed discontinuation of follow-up as the patient is a long-term cancer survivor We reviewed current guidelines. There is no benefit for future surveillance imaging unless he has signs and symptoms to suggest disease recurrence He is educated to watch out for signs and symptoms of cancer recurrence I recommend annual influenza vaccination   No orders of the defined types were placed in this encounter.   All questions were answered. The patient knows to call the clinic with any problems, questions or concerns. The total time spent in the appointment was 15 minutes encounter with patients including review of chart and various tests results, discussions about plan of care and coordination of care plan   Artis Delay, MD 08/07/2019 8:51 AM  INTERVAL HISTORY: Please see below for problem oriented charting. He returns for annual lymphoma follow-up Since last time I saw him, he has lost a tremendous amount of weight by giving up alcohol intake He has lost over 85 pounds His blood pressure is now low and he stopped taking his blood pressure pill as well as he was dizzy He feels strong and healthy No new lymphadenopathy No recent infection, fever or chills  SUMMARY OF ONCOLOGIC HISTORY: Oncology History Overview Note  Diffuse large B cell lymphoma   Staging form: Lymphoid Neoplasms, AJCC 6th Edition     Clinical stage from 12/30/2013: Stage III - Signed by Artis Delay, MD on 01/08/2014     Pathologic: No stage assigned - Unsigned     History of B-cell lymphoma  12/14/2013 Imaging   CT scan of the abdomen and  pelvis showed bulky left iliac chain and left inguinal adenopathy, highly worrisome for lymphoma.   12/22/2013 Surgery   He underwent excisional lymph node biopsy of the left inguinal region that confirm lymphoma diagnosis. No   12/22/2013 Pathology Results   Accession: [id number] pathology showed a high-grade follicular lymphoma with possibly foci of diffuse large B-cell lymphoma   01/04/2014 Bone Marrow Biopsy   BM biopsy was negative with normal cytogenetics   01/05/2014 Imaging   PET CT scan showed lymph nodes involvement of both sides of her diaphragm   01/06/2014 Surgery   He has port placed   01/07/2014 Imaging   ECHO showed preserved EF of 73%, mildly dilated atrium   01/12/2014 - 04/27/2014 Chemotherapy   He is received 6 cycles of R CHOP chemotherapy   02/02/2014 Adverse Reaction   Vincristine dose was reduced by 50% due to early signs of peripheral neuropathy   03/11/2014 Imaging   Repeat PET scan showed near complete response to treatment   06/07/2014 Imaging   PET scan showed complete response to treatment   04/30/2016 Imaging   CT: No acute findings. No evidence for residual or recurrent mass or adenopathy. 2. Aortic atherosclerosis 3. Hepatic steatosis 4. Gallstone.     REVIEW OF SYSTEMS:   Constitutional: Denies fevers, chills or abnormal weight loss Eyes: Denies blurriness of vision Ears, nose, mouth, throat, and face: Denies mucositis or sore throat Respiratory: Denies cough, dyspnea or wheezes Cardiovascular: Denies palpitation, chest discomfort or lower extremity swelling Gastrointestinal:  Denies nausea, heartburn or change  in bowel habits Skin: Denies abnormal skin rashes Lymphatics: Denies new lymphadenopathy or easy bruising Neurological:Denies numbness, tingling or new weaknesses Behavioral/Psych: Mood is stable, no new changes  All other systems were reviewed with the patient and are negative.  I have reviewed the past medical history, past surgical  history, social history and family history with the patient and they are unchanged from previous note.  ALLERGIES:  has No Known Allergies.  MEDICATIONS:  Current Outpatient Medications  Medication Sig Dispense Refill  . allopurinol (ZYLOPRIM) 100 MG tablet Take 1 tablet (100 mg total) by mouth daily. 90 tablet 1  . colchicine 0.6 MG tablet 2 pills now then 1 bid prn for gout flare up 20 tablet 3  . hydrocortisone cream 1 % Apply 1 application topically as needed for itching.    . naproxen sodium (ALEVE) 220 MG tablet Take 220 mg by mouth 2 (two) times daily as needed (for pain or headache).    . sildenafil (REVATIO) 20 MG tablet Take 2 tablets po as needed for sex 50 tablet 0   No current facility-administered medications for this visit.    PHYSICAL EXAMINATION: ECOG PERFORMANCE STATUS: 0 - Asymptomatic  Vitals:   08/07/19 0831  BP: 103/67  Pulse: 60  Resp: 18  Temp: 97.9 F (36.6 C)  SpO2: 100%   Filed Weights   08/07/19 0831  Weight: 208 lb 6.4 oz (94.5 kg)    GENERAL:alert, no distress and comfortable SKIN: skin color, texture, turgor are normal, no rashes or significant lesions EYES: normal, Conjunctiva are pink and non-injected, sclera clear OROPHARYNX:no exudate, no erythema and lips, buccal mucosa, and tongue normal  NECK: supple, thyroid normal size, non-tender, without nodularity LYMPH:  no palpable lymphadenopathy in the cervical, axillary or inguinal LUNGS: clear to auscultation and percussion with normal breathing effort HEART: regular rate & rhythm and no murmurs and no lower extremity edema ABDOMEN:abdomen soft, non-tender and normal bowel sounds Musculoskeletal:no cyanosis of digits and no clubbing  NEURO: alert & oriented x 3 with fluent speech, no focal motor/sensory deficits  LABORATORY DATA:  I have reviewed the data as listed    Component Value Date/Time   NA 141 08/07/2019 0801   NA 140 11/01/2016 0749   K 4.0 08/07/2019 0801   K 4.3  11/01/2016 0749   CL 105 08/07/2019 0801   CO2 26 08/07/2019 0801   CO2 27 11/01/2016 0749   GLUCOSE 100 (H) 08/07/2019 0801   GLUCOSE 103 11/01/2016 0749   BUN 20 08/07/2019 0801   BUN 20.4 11/01/2016 0749   CREATININE 0.83 08/07/2019 0801   CREATININE 0.95 09/09/2018 0952   CREATININE 1.0 11/01/2016 0749   CALCIUM 9.6 08/07/2019 0801   CALCIUM 10.2 11/01/2016 0749   PROT 6.9 08/07/2019 0801   PROT 7.4 11/01/2016 0749   ALBUMIN 3.8 08/07/2019 0801   ALBUMIN 4.0 11/01/2016 0749   AST 17 08/07/2019 0801   AST 41 (H) 11/01/2016 0749   ALT 16 08/07/2019 0801   ALT 65 (H) 11/01/2016 0749   ALKPHOS 74 08/07/2019 0801   ALKPHOS 58 11/01/2016 0749   BILITOT 0.4 08/07/2019 0801   BILITOT 0.79 11/01/2016 0749   GFRNONAA >60 08/07/2019 0801   GFRNONAA 85 09/09/2018 0952   GFRAA >60 08/07/2019 0801   GFRAA 98 09/09/2018 0952    No results found for: SPEP, UPEP  Lab Results  Component Value Date   WBC 5.5 08/07/2019   NEUTROABS 3.0 08/07/2019   HGB 14.4 08/07/2019  HCT 42.9 08/07/2019   MCV 95.3 08/07/2019   PLT 194 08/07/2019      Chemistry      Component Value Date/Time   NA 141 08/07/2019 0801   NA 140 11/01/2016 0749   K 4.0 08/07/2019 0801   K 4.3 11/01/2016 0749   CL 105 08/07/2019 0801   CO2 26 08/07/2019 0801   CO2 27 11/01/2016 0749   BUN 20 08/07/2019 0801   BUN 20.4 11/01/2016 0749   CREATININE 0.83 08/07/2019 0801   CREATININE 0.95 09/09/2018 0952   CREATININE 1.0 11/01/2016 0749      Component Value Date/Time   CALCIUM 9.6 08/07/2019 0801   CALCIUM 10.2 11/01/2016 0749   ALKPHOS 74 08/07/2019 0801   ALKPHOS 58 11/01/2016 0749   AST 17 08/07/2019 0801   AST 41 (H) 11/01/2016 0749   ALT 16 08/07/2019 0801   ALT 65 (H) 11/01/2016 0749   BILITOT 0.4 08/07/2019 0801   BILITOT 0.79 11/01/2016 0749

## 2019-08-07 NOTE — Assessment & Plan Note (Signed)
Clinically, he had no signs of disease. PET CT scan from 2016 and CT from 04/30/16 showed complete remission. We discussed discontinuation of follow-up as the patient is a long-term cancer survivor We reviewed current guidelines. There is no benefit for future surveillance imaging unless he has signs and symptoms to suggest disease recurrence He is educated to watch out for signs and symptoms of cancer recurrence I recommend annual influenza vaccination

## 2019-08-11 ENCOUNTER — Telehealth: Payer: Self-pay | Admitting: Hematology and Oncology

## 2019-08-11 NOTE — Telephone Encounter (Signed)
No 7/2 los, no changes made to pt schedule

## 2019-09-03 IMAGING — US US LIVER ELASTOGRAPHY
1 series · 13 of 25 positions shown · non-contrast
Comparison: None.

CLINICAL DATA: Elevated liver function tests.

EXAM:
US LIVER ELASTOGRAPHY
TECHNIQUE: Ultrasound elastography evaluation of the liver was performed. A
region of interest was placed in the right lobe of the liver.
Following application of a compressive sonographic pulse, shear
waves were detected in the adjacent hepatic tissue and the shear
wave velocity was calculated. Multiple assessments were performed at
the selected site. Median shear wave velocity is correlated to a
Metavir fibrosis score.

[Series 1: us liver elastography · 13 of 41 slices shown]
[im 1/41]
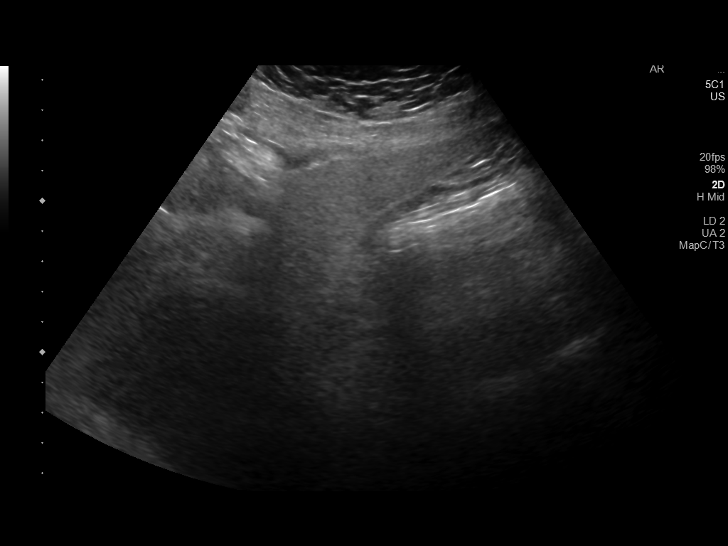
[im 4/41]
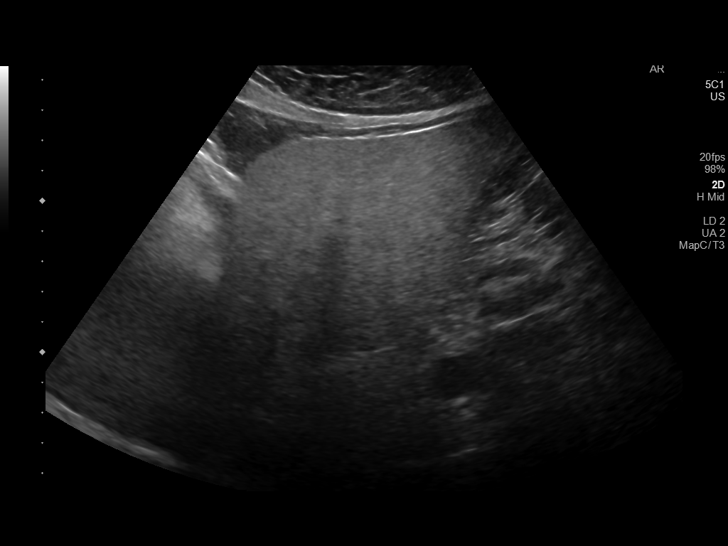
[im 7/41]
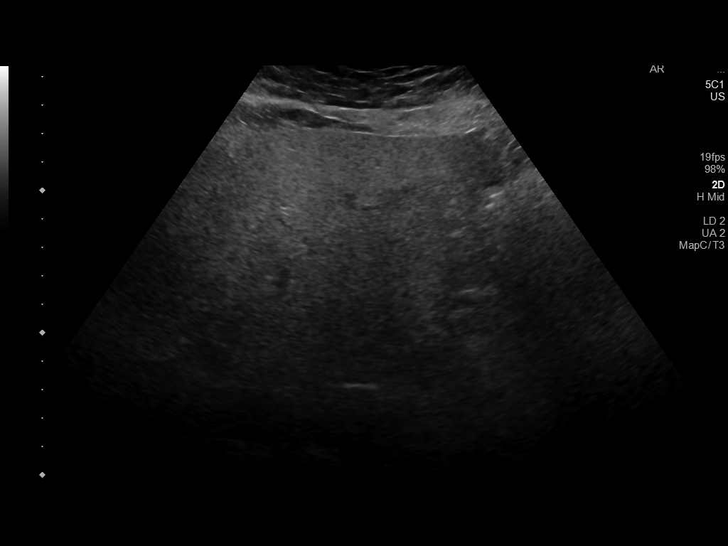
[im 11/41]
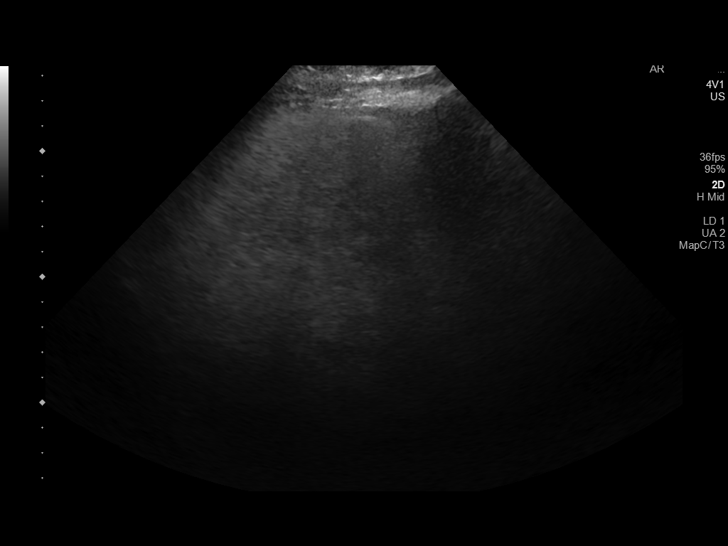
[im 14/41]
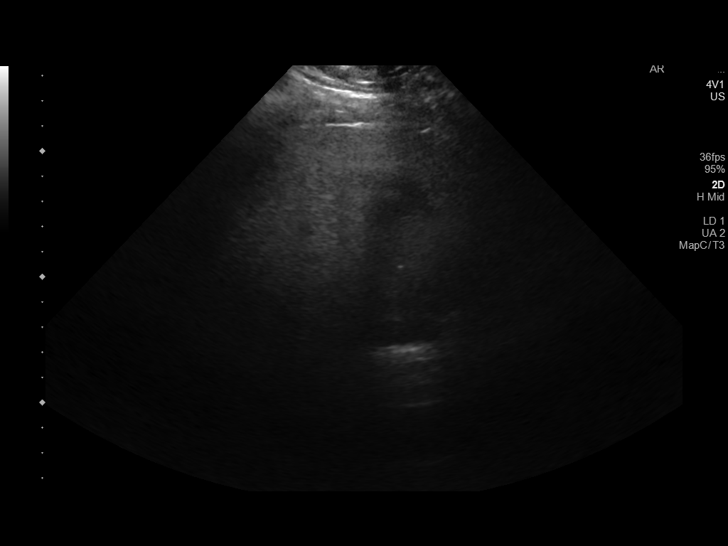
[im 17/41]
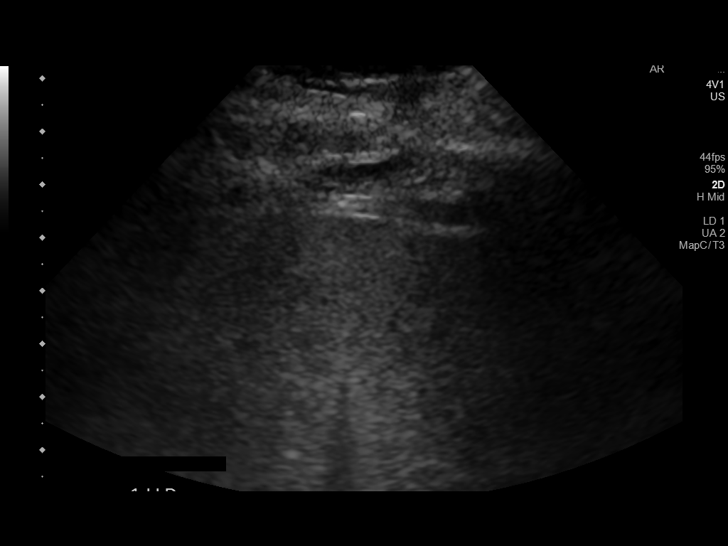
[im 21/41]
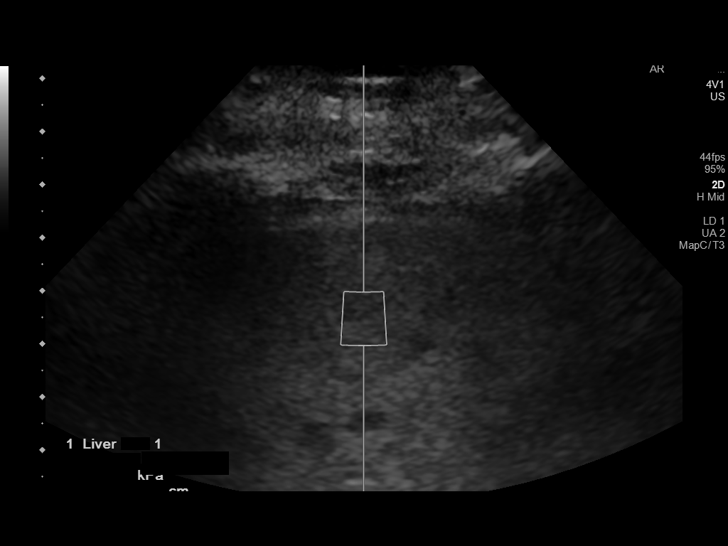
[im 24/41]
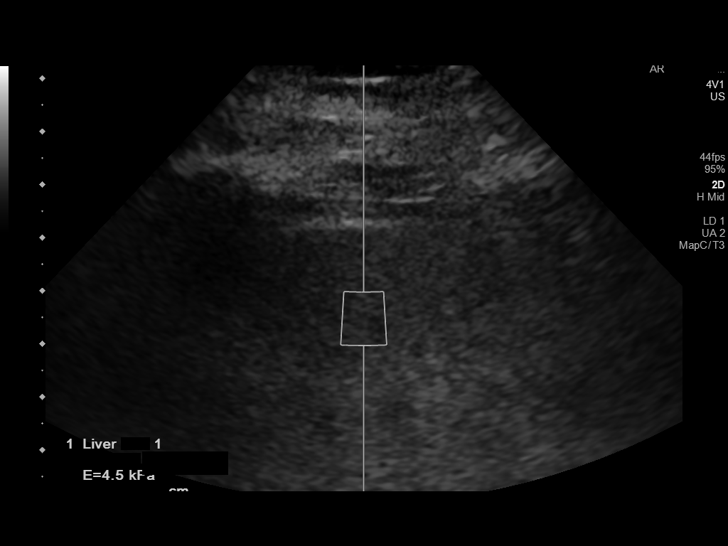
[im 27/41]
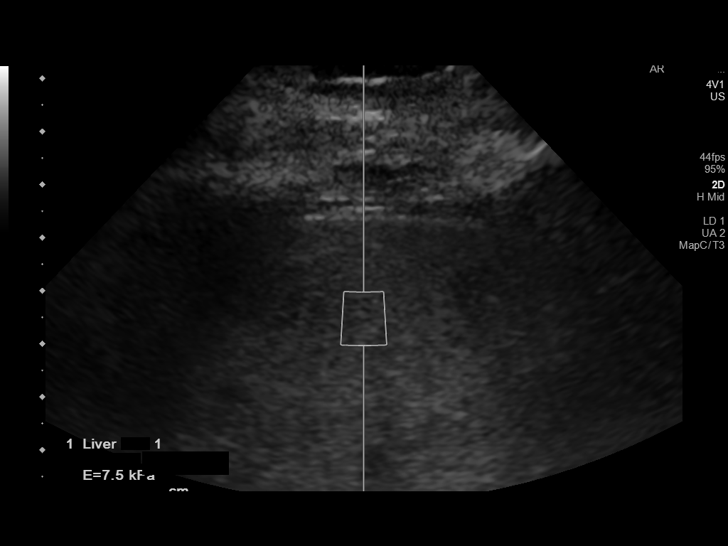
[im 31/41]
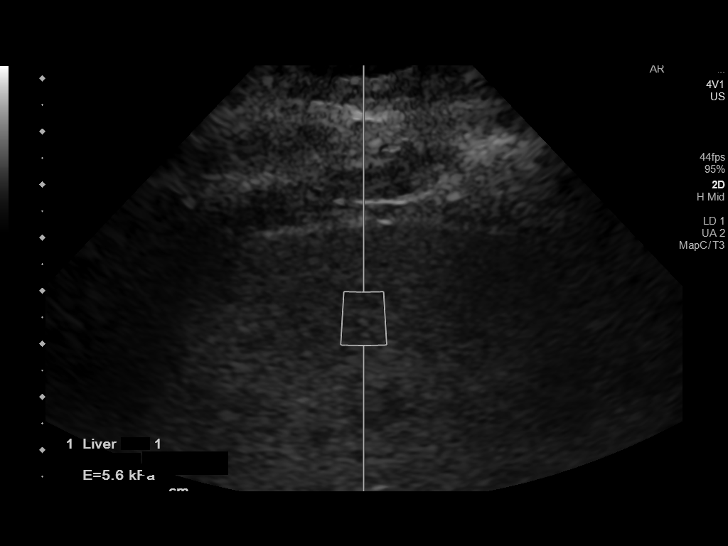
[im 34/41]
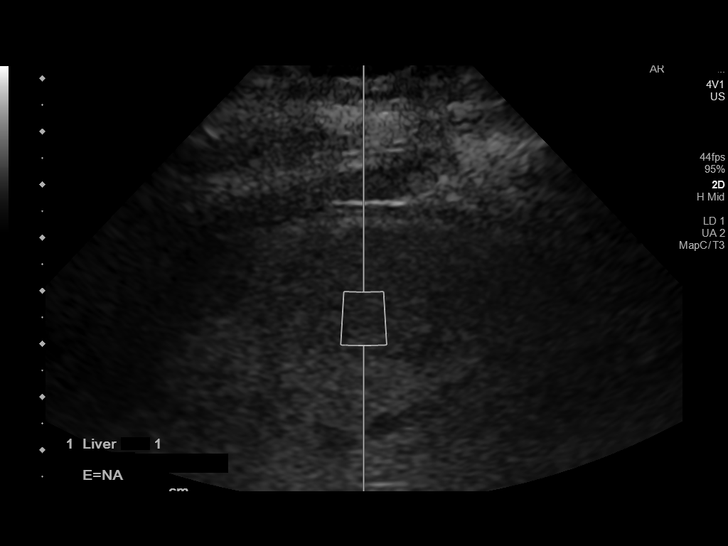
[im 37/41]
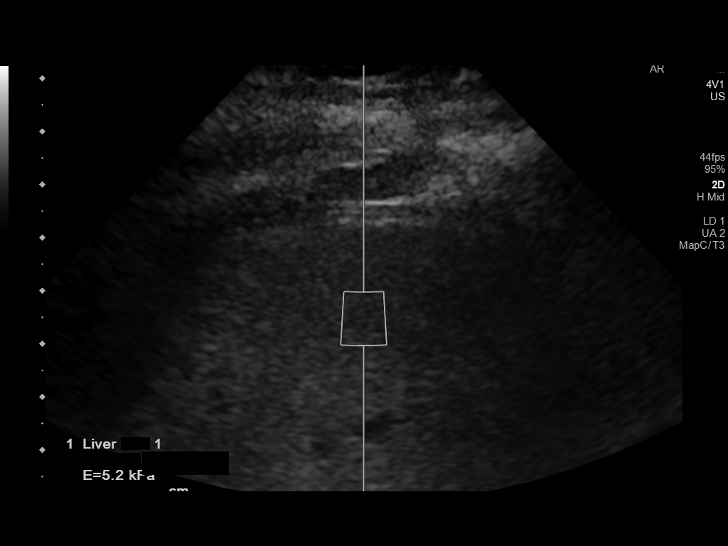
[im 41/41]
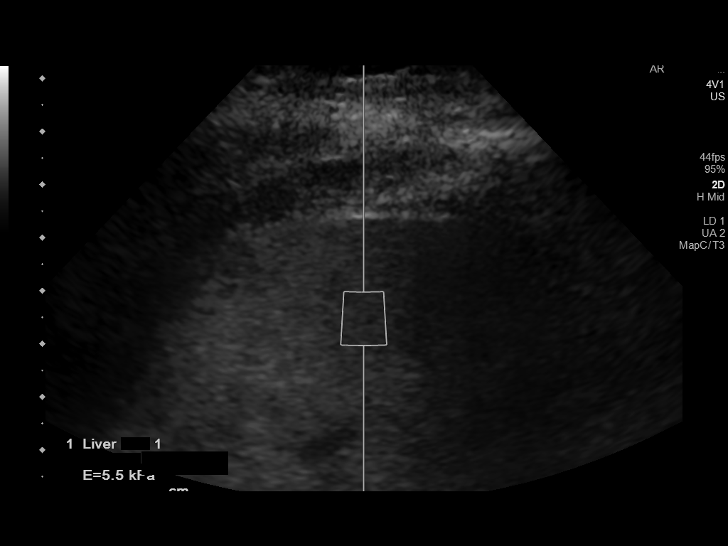

[13 of 25 positions shown; findings below may reference images not displayed]

FINDINGS: Liver: Diffusely increased echogenicity of the hepatic parenchyma,
consistent with hepatic steatosis. No hepatic mass identified.
Portal vein is patent on color Doppler imaging with normal direction
of blood flow towards the liver.

ULTRASOUND HEPATIC ELASTOGRAPHY

Device: Siemens Helix VTQ

Patient position: Left Lateral Decubitus

Transducer: [id number]

Number of measurements: 10

Hepatic segment:  8

Median velocity:   1.52 m/sec

IQR:

IQR/Median velocity ratio:

Corresponding Metavir fibrosis score:  F2 + some F3

Risk of fibrosis: Moderate

Limitations of exam: None

Please note that abnormal shear wave velocities may also be
identified in clinical settings other than with hepatic fibrosis,
such as: acute hepatitis, elevated right heart and central venous
pressures including use of beta blockers, Emir disease
([place]), infiltrative processes such as
mastocytosis/amyloidosis/infiltrative tumor, extrahepatic
cholestasis, in the post-prandial state, and liver transplantation.
Correlation with patient history, laboratory data, and clinical
condition recommended.
IMPRESSION: Liver: Diffuse hepatic steatosis.

Elastography: Median hepatic shear wave velocity is calculated at
1.52 m/sec.

Corresponding Metavir fibrosis score is F2 + some F3.

Risk of fibrosis is Moderate.

Follow-up: Additional testing appropriate

## 2019-09-24 ENCOUNTER — Other Ambulatory Visit: Payer: Self-pay

## 2019-09-24 ENCOUNTER — Ambulatory Visit (HOSPITAL_COMMUNITY)
Admission: RE | Admit: 2019-09-24 | Discharge: 2019-09-24 | Disposition: A | Discharge status: Home / Self Care | Payer: BC Managed Care – PPO | Source: Ambulatory Visit | Attending: Gastroenterology | Admitting: Gastroenterology

## 2019-09-24 DIAGNOSIS — R748 Abnormal levels of other serum enzymes: Secondary | ICD-10-CM

## 2019-11-23 ENCOUNTER — Other Ambulatory Visit: Payer: Self-pay | Admitting: *Deleted

## 2019-11-23 NOTE — Telephone Encounter (Signed)
Refill request from [facility] for allopurinol. Please contact pt to schedule appointment and send back to nurses.

## 2019-11-24 ENCOUNTER — Telehealth: Payer: Self-pay

## 2019-11-24 NOTE — Telephone Encounter (Signed)
I will. That would be fine

## 2019-11-24 NOTE — Telephone Encounter (Signed)
Patient states you told his wife Jared Bentley you would take him on as a patient from Dr.Taylor since you see his spouse. Please advise

## 2019-11-24 NOTE — Telephone Encounter (Signed)
Please advise. Thank you

## 2019-11-25 MED ORDER — ALLOPURINOL 100 MG PO TABS: 100.0000 mg | ORAL_TABLET | Freq: Every day | ORAL | 0 refills | Status: DC

## 2019-11-25 NOTE — Telephone Encounter (Signed)
Schedule appointment 11/8

## 2019-12-14 ENCOUNTER — Encounter: Payer: Self-pay | Admitting: Family Medicine

## 2019-12-14 ENCOUNTER — Other Ambulatory Visit: Payer: Self-pay

## 2019-12-14 ENCOUNTER — Ambulatory Visit (INDEPENDENT_AMBULATORY_CARE_PROVIDER_SITE_OTHER): Payer: Medicare HMO | Admitting: Family Medicine

## 2019-12-14 VITALS — BP 108/68 | HR 85 | Temp 98.1°F | Wt 204.2 lb

## 2019-12-14 DIAGNOSIS — M1000 Idiopathic gout, unspecified site: Secondary | ICD-10-CM

## 2019-12-14 DIAGNOSIS — Z8739 Personal history of other diseases of the musculoskeletal system and connective tissue: Secondary | ICD-10-CM | POA: Insufficient documentation

## 2019-12-14 DIAGNOSIS — E785 Hyperlipidemia, unspecified: Secondary | ICD-10-CM | POA: Insufficient documentation

## 2019-12-14 DIAGNOSIS — I700 Atherosclerosis of aorta: Secondary | ICD-10-CM | POA: Insufficient documentation

## 2019-12-14 DIAGNOSIS — L409 Psoriasis, unspecified: Secondary | ICD-10-CM | POA: Diagnosis not present

## 2019-12-14 DIAGNOSIS — K760 Fatty (change of) liver, not elsewhere classified: Secondary | ICD-10-CM

## 2019-12-14 NOTE — Progress Notes (Signed)
   Subjective:    Patient ID: Jared Bentley, male    DOB: 03/25/1955, 64 y.o.   MRN: [id number]  HPI Pt here to establish care with provider. Pt was seeing Dr.Steve. would like to talk to provider about a dermatologic issue.    Review of Systems  Constitutional: Negative for activity change, fatigue and fever.  HENT: Negative for congestion and rhinorrhea.   Respiratory: Negative for cough and shortness of breath.   Cardiovascular: Negative for chest pain and leg swelling.  Gastrointestinal: Negative for abdominal pain, diarrhea and nausea.  Genitourinary: Negative for dysuria and hematuria.  Neurological: Negative for weakness and headaches.  Psychiatric/Behavioral: Negative for agitation and behavioral problems.       Objective:   Physical Exam Vitals reviewed.  Constitutional:      General: He is not in acute distress. HENT:     Head: Normocephalic and atraumatic.  Eyes:     General:        Right eye: No discharge.        Left eye: No discharge.  Neck:     Trachea: No tracheal deviation.  Cardiovascular:     Rate and Rhythm: Normal rate and regular rhythm.     Heart sounds: Normal heart sounds. No murmur heard.   Pulmonary:     Effort: Pulmonary effort is normal. No respiratory distress.     Breath sounds: Normal breath sounds.  Lymphadenopathy:     Cervical: No cervical adenopathy.  Skin:    General: Skin is warm and dry.  Neurological:     Mental Status: He is alert.     Coordination: Coordination normal.  Psychiatric:        Behavior: Behavior normal.           Assessment & Plan:  Hyperlipidemia, unspecified hyperlipidemia type - Plan: Uric acid, Basic Metabolic Panel (BMET), Lipid Profile, PSA  Fatty liver - Plan: Uric acid, Basic Metabolic Panel (BMET), Lipid Profile, PSA  History of gout - Plan: Uric acid, Basic Metabolic Panel (BMET), Lipid Profile, PSA  Acute idiopathic gout, unspecified site - Plan: Uric acid, Basic Metabolic Panel (BMET),  Lipid Profile, PSA  Psoriasis  Aortic atherosclerosis ([place]) med 1. Hyperlipidemia, unspecified hyperlipidemia type Check cholesterol profile.  Recommend that he start on medicine.  We need to check baseline cholesterol. - Uric acid - Basic Metabolic Panel (BMET) - Lipid Profile - PSA  2. Fatty liver Watch diet stay active keep weight down - Uric acid - Basic Metabolic Panel (BMET) - Lipid Profile - PSA  3. History of gout None recently - Uric acid - Basic Metabolic Panel (BMET) - Lipid Profile - PSA  4. Acute idiopathic gout, unspecified site None recently - Uric acid - Basic Metabolic Panel (BMET) - Lipid Profile - PSA  5. Psoriasis Patient interested in potential advanced medication for psoriasis I have recommended that he follow through with his dermatologist regarding this  6. Aortic atherosclerosis ([place]) Was seen on a CAT scan previously as well as this ultrasound I recommend a statin Check labs await results wellness exam by the end of December

## 2019-12-16 DIAGNOSIS — Z8739 Personal history of other diseases of the musculoskeletal system and connective tissue: Secondary | ICD-10-CM | POA: Diagnosis not present

## 2019-12-16 DIAGNOSIS — K760 Fatty (change of) liver, not elsewhere classified: Secondary | ICD-10-CM | POA: Diagnosis not present

## 2019-12-16 DIAGNOSIS — E785 Hyperlipidemia, unspecified: Secondary | ICD-10-CM | POA: Diagnosis not present

## 2019-12-16 DIAGNOSIS — M1000 Idiopathic gout, unspecified site: Secondary | ICD-10-CM | POA: Diagnosis not present

## 2019-12-17 LAB — BASIC METABOLIC PANEL
BUN/Creatinine Ratio: 22 (ref 10–24)
BUN: 18 mg/dL (ref 8–27)
CO2: 28 mmol/L (ref 20–29)
Calcium: 9.9 mg/dL (ref 8.6–10.2)
Chloride: 102 mmol/L (ref 96–106)
Creatinine, Ser: 0.82 mg/dL (ref 0.76–1.27)
GFR calc Af Amer: 107 mL/min/{1.73_m2} (ref >59)
GFR calc non Af Amer: 93 mL/min/{1.73_m2} (ref >59)
Glucose: 101 mg/dL — ABNORMAL HIGH (ref 65–99)
Potassium: 5 mmol/L (ref 3.5–5.2)
Sodium: 142 mmol/L (ref 134–144)

## 2019-12-17 LAB — PSA: Prostate Specific Ag, Serum: 0.8 ng/mL (ref 0.0–4.0)

## 2019-12-17 LAB — LIPID PANEL
Chol/HDL Ratio: 2.3 ratio (ref 0.0–5.0)
Cholesterol, Total: 175 mg/dL (ref 100–199)
HDL: 76 mg/dL (ref >39)
LDL Chol Calc (NIH): 90 mg/dL (ref 0–99)
Triglycerides: 41 mg/dL (ref 0–149)
VLDL Cholesterol Cal: 9 mg/dL (ref 5–40)

## 2019-12-17 LAB — URIC ACID: Uric Acid: 5.2 mg/dL (ref 3.8–8.4)

## 2019-12-18 DIAGNOSIS — G4733 Obstructive sleep apnea (adult) (pediatric): Secondary | ICD-10-CM | POA: Diagnosis not present

## 2020-01-04 DIAGNOSIS — R69 Illness, unspecified: Secondary | ICD-10-CM | POA: Diagnosis not present

## 2020-01-15 DIAGNOSIS — Z87891 Personal history of nicotine dependence: Secondary | ICD-10-CM | POA: Diagnosis not present

## 2020-01-15 DIAGNOSIS — M109 Gout, unspecified: Secondary | ICD-10-CM | POA: Diagnosis not present

## 2020-01-15 DIAGNOSIS — C8590 Non-Hodgkin lymphoma, unspecified, unspecified site: Secondary | ICD-10-CM | POA: Diagnosis not present

## 2020-01-15 DIAGNOSIS — G4730 Sleep apnea, unspecified: Secondary | ICD-10-CM | POA: Diagnosis not present

## 2020-01-15 DIAGNOSIS — N529 Male erectile dysfunction, unspecified: Secondary | ICD-10-CM | POA: Diagnosis not present

## 2020-01-15 DIAGNOSIS — Z809 Family history of malignant neoplasm, unspecified: Secondary | ICD-10-CM | POA: Diagnosis not present

## 2020-01-17 DIAGNOSIS — G4733 Obstructive sleep apnea (adult) (pediatric): Secondary | ICD-10-CM | POA: Diagnosis not present

## 2020-02-03 ENCOUNTER — Ambulatory Visit (INDEPENDENT_AMBULATORY_CARE_PROVIDER_SITE_OTHER): Payer: Medicare HMO | Admitting: Family Medicine

## 2020-02-03 ENCOUNTER — Other Ambulatory Visit: Payer: Self-pay

## 2020-02-03 ENCOUNTER — Encounter: Payer: Self-pay | Admitting: Family Medicine

## 2020-02-03 VITALS — BP 114/72 | Temp 98.0°F | Ht 72.0 in | Wt 210.0 lb

## 2020-02-03 DIAGNOSIS — E785 Hyperlipidemia, unspecified: Secondary | ICD-10-CM | POA: Diagnosis not present

## 2020-02-03 DIAGNOSIS — K760 Fatty (change of) liver, not elsewhere classified: Secondary | ICD-10-CM | POA: Diagnosis not present

## 2020-02-03 DIAGNOSIS — Z0000 Encounter for general adult medical examination without abnormal findings: Secondary | ICD-10-CM | POA: Diagnosis not present

## 2020-02-03 DIAGNOSIS — Z8739 Personal history of other diseases of the musculoskeletal system and connective tissue: Secondary | ICD-10-CM | POA: Diagnosis not present

## 2020-02-03 DIAGNOSIS — I700 Atherosclerosis of aorta: Secondary | ICD-10-CM

## 2020-02-03 DIAGNOSIS — G4733 Obstructive sleep apnea (adult) (pediatric): Secondary | ICD-10-CM | POA: Diagnosis not present

## 2020-02-03 MED ORDER — ALLOPURINOL 100 MG PO TABS
100.0000 mg | ORAL_TABLET | Freq: Every day | ORAL | 1 refills | Status: DC
Start: 2020-02-03 — End: 2020-07-28

## 2020-02-03 MED ORDER — PRAVASTATIN SODIUM 10 MG PO TABS: ORAL_TABLET | ORAL | 1 refills | Status: DC

## 2020-02-03 MED ORDER — CLOBETASOL PROPIONATE 0.05 % EX CREA: 1.0000 "application " | TOPICAL_CREAM | Freq: Two times a day (BID) | CUTANEOUS | 1 refills | Status: DC

## 2020-02-03 NOTE — Progress Notes (Addendum)
Subjective:    Patient ID: Jared Bentley, male    DOB: 1955-06-27, 64 y.o.   MRN: [id number]  HPI  The patient comes in today for a wellness visit.  Encounter for initial annual wellness visit (AWV) in Medicare patient - Plan: PR ELECTROCARDIOGRAM, COMPLETE  Obstructive sleep apnea  History of gout  Aortic atherosclerosis (HCC)  Hyperlipidemia, unspecified hyperlipidemia type - Plan: Lipid panel  Fatty liver - Plan: Hepatic function panel   Patient had a recent ultrasound from gastroenterology which did show atherosclerotic plaque within it his aorta we did discuss how being on statin medication would lower the risk of heart disease significantly patient is willing to try the medication  He does have sleep apnea on them CPAP machine helps him he utilizes it on a regular basis for over 6 hours per night  Patient does drink alcohol but limits himself to 2 drinks or less per day he denies excessive alcohol intake  No falls no injuries.  Cognitive skills going good. A review of their health history was completed.  A review of medications was also completed.  Any needed refills; no  Eating habits: eating good  Falls/  MVA accidents in past few months: none  Regular exercise: walk a mile a day  Specialist pt sees on regular basis:none Preventative health issues were discussed.   Additional concerns: none Results for orders placed or performed in visit on 12/14/19  Uric acid  Result Value Ref Range   Uric Acid 5.2 3.8 - 8.4 mg/dL  Basic Metabolic Panel (BMET)  Result Value Ref Range   Glucose 101 (H) 65 - 99 mg/dL   BUN 18 8 - 27 mg/dL   Creatinine, Ser 2.35 0.76 - 1.27 mg/dL   GFR calc non Af Amer 93 >59 mL/min/1.73   GFR calc Af Amer 107 >59 mL/min/1.73   BUN/Creatinine Ratio 22 10 - 24   Sodium 142 134 - 144 mmol/L   Potassium 5.0 3.5 - 5.2 mmol/L   Chloride 102 96 - 106 mmol/L   CO2 28 20 - 29 mmol/L   Calcium 9.9 8.6 - 10.2 mg/dL  Lipid Profile  Result Value  Ref Range   Cholesterol, Total 175 100 - 199 mg/dL   Triglycerides 41 0 - 149 mg/dL   HDL 76 >36 mg/dL   VLDL Cholesterol Cal 9 5 - 40 mg/dL   LDL Chol Calc (NIH) 90 0 - 99 mg/dL   Chol/HDL Ratio 2.3 0.0 - 5.0 ratio  PSA  Result Value Ref Range   Prostate Specific Ag, Serum 0.8 0.0 - 4.0 ng/mL     Review of Systems  Constitutional: Negative for diaphoresis and fatigue.  HENT: Negative for congestion and rhinorrhea.   Respiratory: Negative for cough and shortness of breath.   Cardiovascular: Negative for chest pain and leg swelling.  Gastrointestinal: Negative for abdominal pain and diarrhea.  Skin: Negative for color change and rash.  Neurological: Negative for dizziness and headaches.  Psychiatric/Behavioral: Negative for behavioral problems and confusion.       Objective:   Physical Exam Vitals reviewed.  Constitutional:      General: He is not in acute distress.    Appearance: He is well-nourished.  HENT:     Head: Normocephalic and atraumatic.  Eyes:     General:        Right eye: No discharge.        Left eye: No discharge.  Neck:     Trachea: No tracheal  deviation.  Cardiovascular:     Rate and Rhythm: Normal rate and regular rhythm.     Heart sounds: Normal heart sounds. No murmur heard.   Pulmonary:     Effort: Pulmonary effort is normal. No respiratory distress.     Breath sounds: Normal breath sounds.  Musculoskeletal:        General: No edema.  Lymphadenopathy:     Cervical: No cervical adenopathy.  Skin:    General: Skin is warm and dry.  Neurological:     Mental Status: He is alert.     Coordination: Coordination normal.  Psychiatric:        Mood and Affect: Mood and affect normal.        Behavior: Behavior normal.   Patient prefer not to do a prostate exam therefore defers Sleep apnea Uses the maching 6 hours nightly EKG done because of Medicare initial annual wellness visit guidelines EKG does have some changes within the initial ST  segment we will discuss this with cardiology to see if they recommend any further evaluation patient relates being asymptomatic     Assessment & Plan:  Adult wellness-complete.wellness physical was conducted today. Importance of diet and exercise were discussed in detail.  In addition to this a discussion regarding safety was also covered. We also reviewed over immunizations and gave recommendations regarding current immunization needed for age.  In addition to this additional areas were also touched on including: Preventative health exams needed:  Colonoscopy next 02/24/2026  Patient was advised yearly wellness exam 1. Obstructive sleep apnea Uses CPAP machine on a regular basis  2. History of gout Under good control lab work recently look good  3. Aortic atherosclerosis (HCC) Because of this start statin the patient agrees low-dose pravastatin 10 mg daily check lipid liver profile in approximately 8 weeks  4. Encounter for initial annual wellness visit (AWV) in Medicare patient Please see per above - PR ELECTROCARDIOGRAM, COMPLETE  5. Hyperlipidemia, unspecified hyperlipidemia type *Pravastatin 10 mg daily check lipid profile in 8 to 10 weeks - Lipid panel  6. Fatty liver Patient doing a good job at staying healthy encourage him to limit alcohol to 2 drinks or less per day  Follow-up in 6 months - Hepatic function panel

## 2020-02-09 ENCOUNTER — Encounter: Payer: Self-pay | Admitting: Family Medicine

## 2020-02-17 DIAGNOSIS — G4733 Obstructive sleep apnea (adult) (pediatric): Secondary | ICD-10-CM | POA: Diagnosis not present

## 2020-03-10 ENCOUNTER — Encounter: Payer: Self-pay | Admitting: Internal Medicine

## 2020-03-17 DIAGNOSIS — G4733 Obstructive sleep apnea (adult) (pediatric): Secondary | ICD-10-CM | POA: Diagnosis not present

## 2020-04-05 DIAGNOSIS — K760 Fatty (change of) liver, not elsewhere classified: Secondary | ICD-10-CM | POA: Diagnosis not present

## 2020-04-05 DIAGNOSIS — E785 Hyperlipidemia, unspecified: Secondary | ICD-10-CM | POA: Diagnosis not present

## 2020-04-06 LAB — HEPATIC FUNCTION PANEL
ALT: 15 IU/L (ref 0–44)
AST: 14 IU/L (ref 0–40)
Albumin: 4.5 g/dL (ref 3.8–4.8)
Alkaline Phosphatase: 84 IU/L (ref 44–121)
Bilirubin Total: 0.5 mg/dL (ref 0.0–1.2)
Bilirubin, Direct: 0.16 mg/dL (ref 0.00–0.40)
Total Protein: 6.8 g/dL (ref 6.0–8.5)

## 2020-04-06 LAB — LIPID PANEL
Chol/HDL Ratio: 2.2 ratio (ref 0.0–5.0)
Cholesterol, Total: 155 mg/dL (ref 100–199)
HDL: 72 mg/dL (ref >39)
LDL Chol Calc (NIH): 73 mg/dL (ref 0–99)
Triglycerides: 43 mg/dL (ref 0–149)
VLDL Cholesterol Cal: 10 mg/dL (ref 5–40)

## 2020-04-14 DIAGNOSIS — G4733 Obstructive sleep apnea (adult) (pediatric): Secondary | ICD-10-CM | POA: Diagnosis not present

## 2020-05-15 DIAGNOSIS — G4733 Obstructive sleep apnea (adult) (pediatric): Secondary | ICD-10-CM | POA: Diagnosis not present

## 2020-05-17 ENCOUNTER — Encounter: Payer: Self-pay | Admitting: Dermatology

## 2020-05-17 ENCOUNTER — Other Ambulatory Visit: Payer: Self-pay | Admitting: Dermatology

## 2020-05-17 ENCOUNTER — Ambulatory Visit (INDEPENDENT_AMBULATORY_CARE_PROVIDER_SITE_OTHER): Payer: Medicare HMO | Admitting: Dermatology

## 2020-05-17 ENCOUNTER — Other Ambulatory Visit: Payer: Self-pay

## 2020-05-17 DIAGNOSIS — L409 Psoriasis, unspecified: Secondary | ICD-10-CM

## 2020-05-17 DIAGNOSIS — L710 Perioral dermatitis: Secondary | ICD-10-CM

## 2020-05-17 DIAGNOSIS — L219 Seborrheic dermatitis, unspecified: Secondary | ICD-10-CM | POA: Diagnosis not present

## 2020-05-17 MED ORDER — HYDROCORTISONE 2.5 % EX OINT: TOPICAL_OINTMENT | Freq: Two times a day (BID) | CUTANEOUS | 0 refills | Status: DC

## 2020-05-17 MED ORDER — DRITHO-CREME HP 1 % EX CREA: TOPICAL_CREAM | Freq: Every day | CUTANEOUS | 0 refills | Status: DC

## 2020-05-18 DIAGNOSIS — Z0100 Encounter for examination of eyes and vision without abnormal findings: Secondary | ICD-10-CM | POA: Diagnosis not present

## 2020-05-18 DIAGNOSIS — H5200 Hypermetropia, unspecified eye: Secondary | ICD-10-CM | POA: Diagnosis not present

## 2020-05-24 ENCOUNTER — Other Ambulatory Visit: Payer: Self-pay | Admitting: Dermatology

## 2020-05-24 DIAGNOSIS — L409 Psoriasis, unspecified: Secondary | ICD-10-CM

## 2020-05-28 ENCOUNTER — Encounter: Payer: Self-pay | Admitting: Dermatology

## 2020-05-28 NOTE — Progress Notes (Signed)
   New patient visit   Subjective  Jared Bentley is a 65 y.o. male who presents for the following: New Patient (Initial Visit) (Patient here today for psoriasis x years per patient it's starting to spread to his face. Per patient it's starting to itch in the crease of his nose, and around mouth. Currently patient isn't using anything for the psoriasis. Per patient he has it on both arms, and both legs. In the past patient has used Protopic, Ultravate, TAC, Enstilar, Clobetasol and Hydrocortisone which helped in the past. ).  Psoriasis plus rash on face Location:  Duration:  Quality:  Associated Signs/Symptoms: Modifying Factors:  Severity:  Timing: Context:   Objective  Well appearing patient in no apparent distress; mood and affect are within normal limits. Objective  Head - Anterior (Face): Relatively thick plaques particularly on scalp and extremities.  Potentially good candidate for noncortisone agents (if we can get these at a reasonable price).  Objective  nose crease: Mixture of central facial finer scaling erythema plus psoriasis represents so-called SEBO psoriasis.    All skin waist up examined.  Plus legs and nails.   Assessment & Plan    Psoriasis Head - Anterior (Face)  Try to obtain an anthralin based product.  Initially used on just 1 or 2 spots for 30 minutes daily.  Warned about staining of the skin.  Avoid use on face and body folds.  Other Related Medications DRITHO-CREME HP 1 % cream  Seborrheic dermatitis nose crease  Patient already well aware to avoid using potent cortisone-based anti-inflammatories on the face.  Also discussed newer systemic agents but for now patient pleased with continuing with topicals.  hydrocortisone 2.5 % ointment - nose crease      I, Lavonna Monarch, MD, have reviewed all documentation for this visit.  The documentation on 05/28/20 for the exam, diagnosis, procedures, and orders are all accurate and complete.

## 2020-05-30 ENCOUNTER — Telehealth: Payer: Self-pay

## 2020-05-30 NOTE — Telephone Encounter (Signed)
I personally checked prices for topical anthralin name brand and generic in the [place], [place], and [place]: I have found nothing truly affordable.  Plan B would be to look for a combination cream such as Duobrii or Sernivo or Taclonex perhaps with the manufacturer promo or cash pay price.  Please let Jared Bentley know what you find.

## 2020-05-30 NOTE — Telephone Encounter (Signed)
Phone call to patient to give him Dr. Onalee Hua recommendations.  Patient states that he would like to hold off on sending in any other creams or sprays at this time.  Patient states that he's doing good with the cream he was able to pick up from his Pharmacy so for now he would like to hold on any further treatment.

## 2020-06-09 DIAGNOSIS — G4733 Obstructive sleep apnea (adult) (pediatric): Secondary | ICD-10-CM | POA: Diagnosis not present

## 2020-07-10 DIAGNOSIS — G4733 Obstructive sleep apnea (adult) (pediatric): Secondary | ICD-10-CM | POA: Diagnosis not present

## 2020-07-27 DIAGNOSIS — M25562 Pain in left knee: Secondary | ICD-10-CM | POA: Diagnosis not present

## 2020-07-27 DIAGNOSIS — M1712 Unilateral primary osteoarthritis, left knee: Secondary | ICD-10-CM | POA: Diagnosis not present

## 2020-07-28 ENCOUNTER — Other Ambulatory Visit: Payer: Self-pay | Admitting: Family Medicine

## 2020-08-09 DIAGNOSIS — G4733 Obstructive sleep apnea (adult) (pediatric): Secondary | ICD-10-CM | POA: Diagnosis not present

## 2020-08-18 DIAGNOSIS — M1712 Unilateral primary osteoarthritis, left knee: Secondary | ICD-10-CM | POA: Diagnosis not present

## 2020-09-01 DIAGNOSIS — G4733 Obstructive sleep apnea (adult) (pediatric): Secondary | ICD-10-CM | POA: Diagnosis not present

## 2020-09-04 IMAGING — US US ABDOMEN COMPLETE W/ ELASTOGRAPHY
1 series · 12 of 25 positions shown · non-contrast
Comparison: CT abdomen and pelvis 04/30/2016, ultrasound
elastography 09/22/2018

CLINICAL DATA: Elevated liver enzymes, history hypertension

EXAM:
ULTRASOUND ABDOMEN
ULTRASOUND HEPATIC ELASTOGRAPHY
TECHNIQUE: Sonography of the upper abdomen was performed. In addition,
ultrasound elastography evaluation of the liver was performed. A
region of interest was placed within the right lobe of the liver.
Following application of a compressive sonographic pulse, tissue
compressibility was assessed. Multiple assessments were performed at
the selected site. Median tissue compressibility was determined.
Previously, hepatic stiffness was assessed by shear wave velocity.
Based on recently published Society of Radiologists in Ultrasound
consensus article, reporting is now recommended to be performed in
the SI units of pressure (kiloPascals) representing hepatic
stiffness/elasticity. The obtained result is compared to the
published reference standards. (cACLD = compensated Advanced Chronic
Liver Disease)

[Series 1: us abdomen complete w/elastography · 12 of 92 slices shown]
[im 4/92]
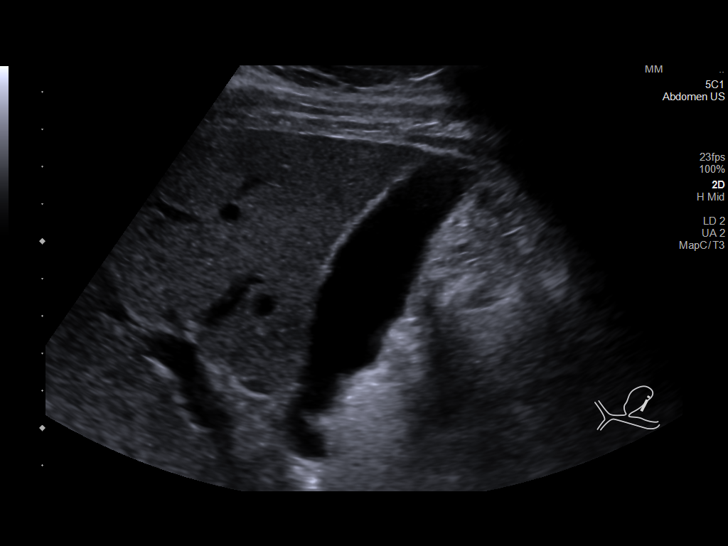
[im 12/92]
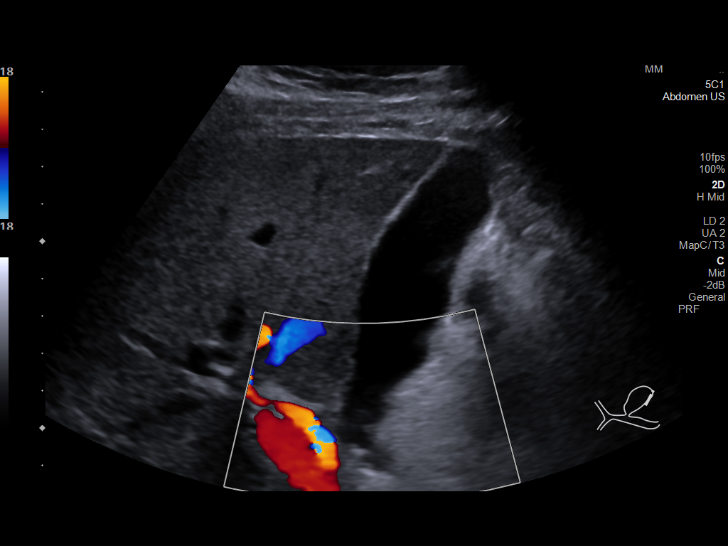
[im 19/92]
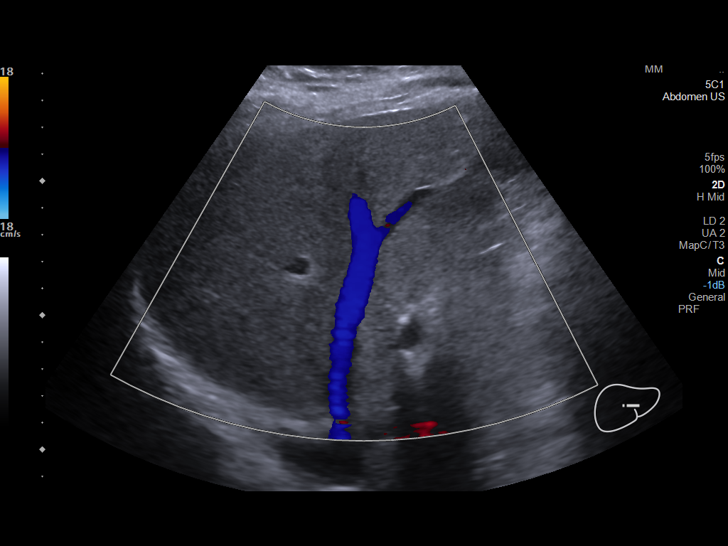
[im 27/92]
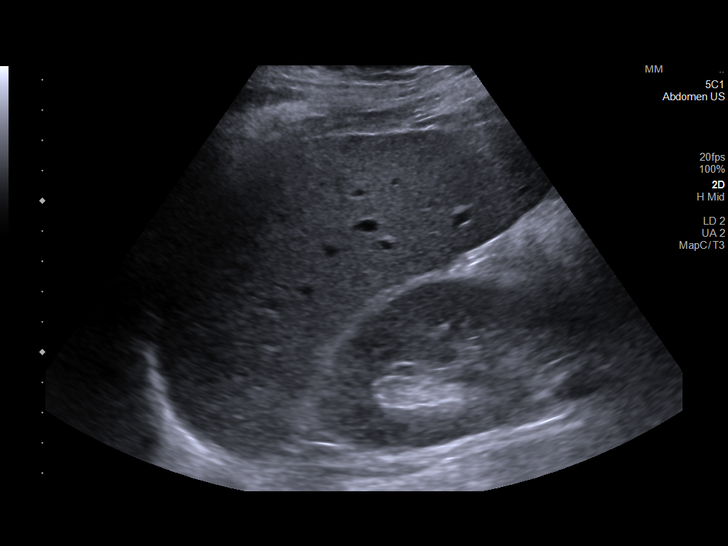
[im 35/92]
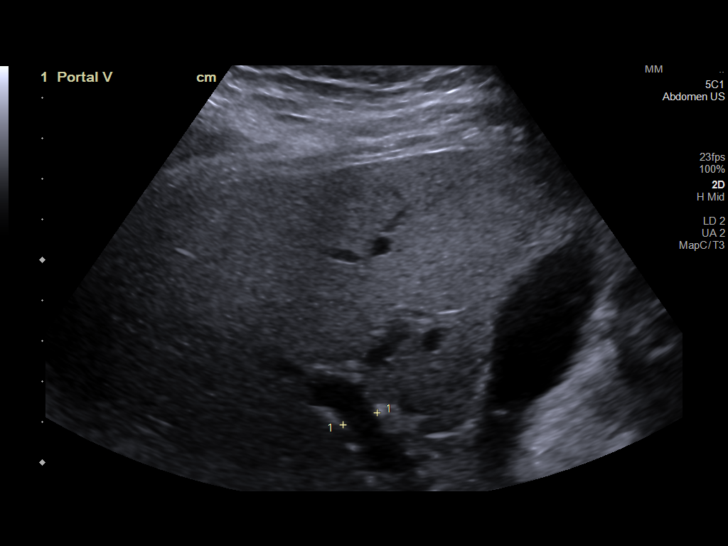
[im 42/92]
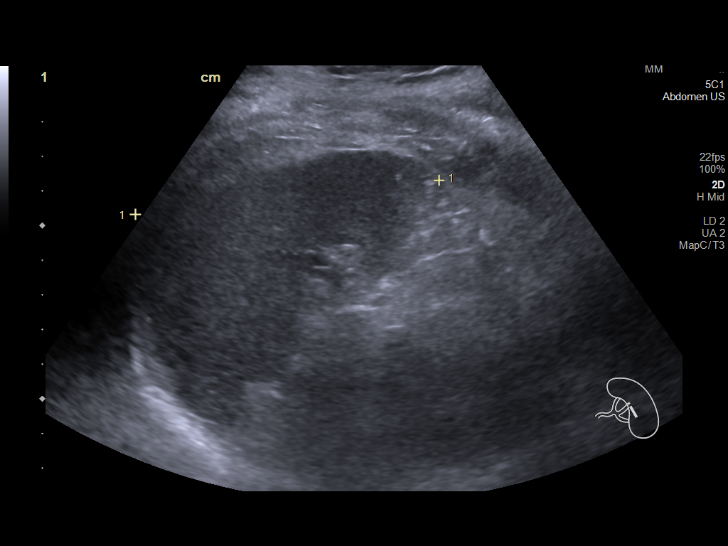
[im 50/92]
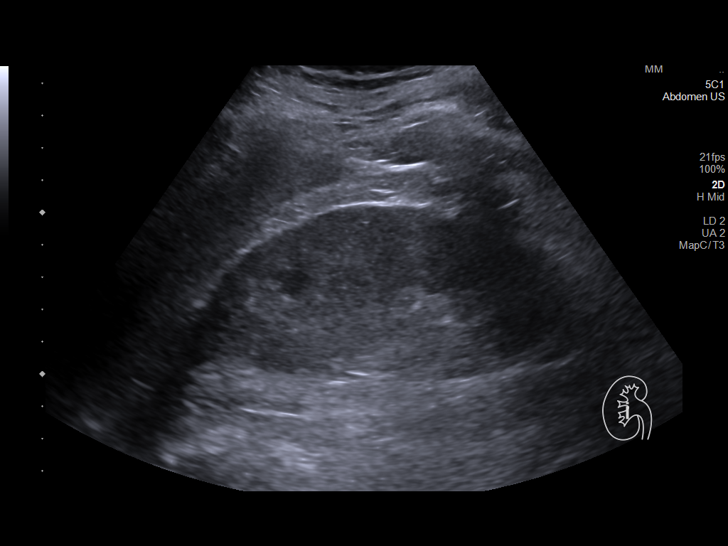
[im 57/92]
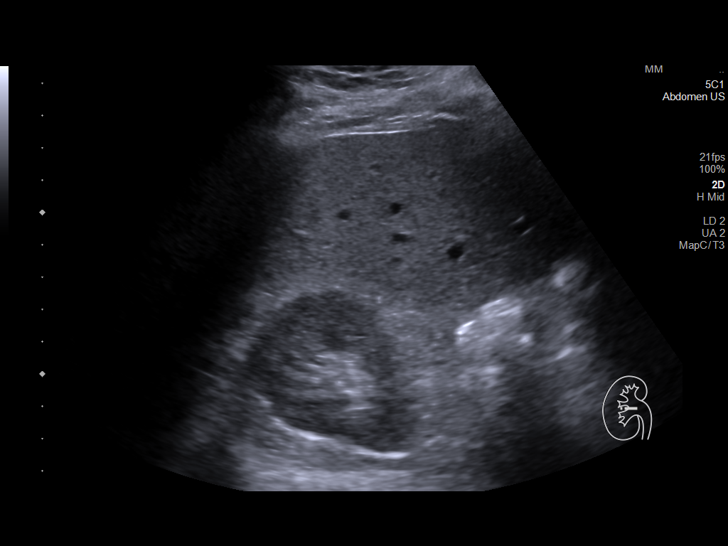
[im 65/92]
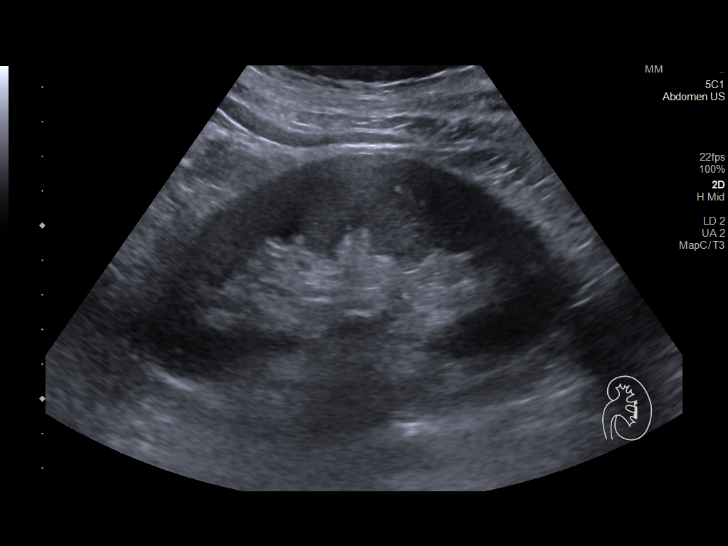
[im 73/92]
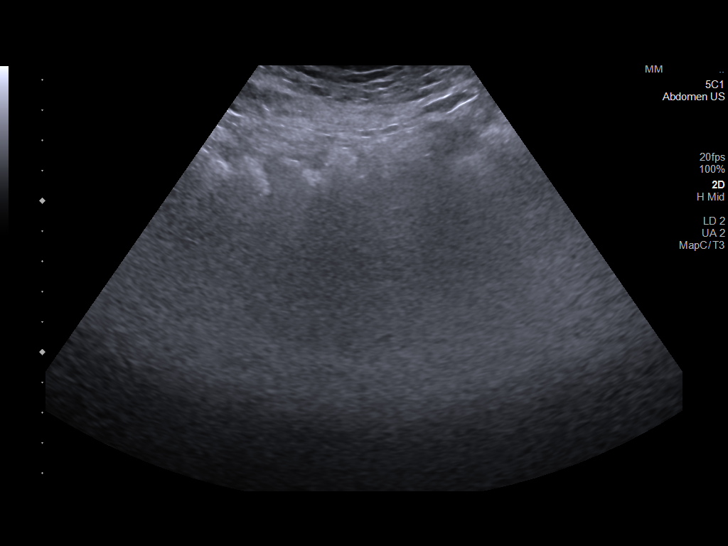
[im 80/92]
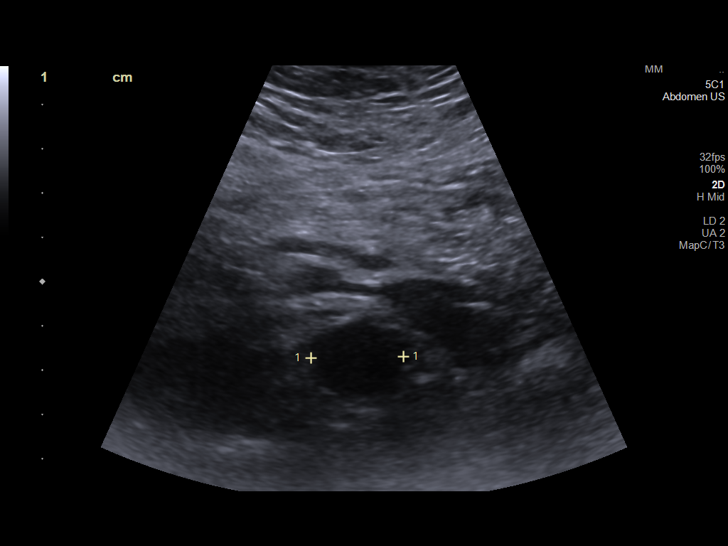
[im 88/92]
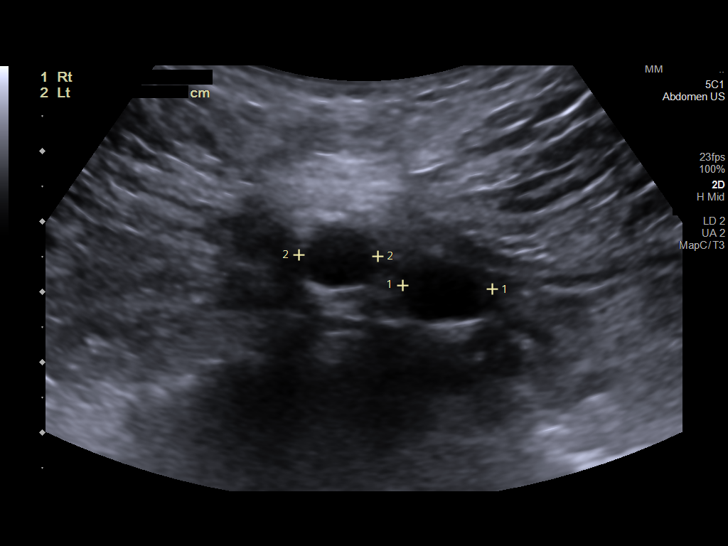

[12 of 25 positions shown; findings below may reference images not displayed]

FINDINGS: ULTRASOUND ABDOMEN

Gallbladder: Questionable small nonshadowing calculus at gallbladder
neck versus artifact. No additional gallstones, wall thickening,
pericholecystic fluid, or sonographic Murphy sign.

Common bile duct: Diameter: 4 mm, normal

Liver: Upper normal echogenicity without mass or nodularity. No
intrahepatic biliary dilatation. Portal vein is patent on color
Doppler imaging with normal direction of blood flow towards the
liver.

IVC: Normal appearance

Pancreas: Obscured by bowel gas

Spleen: Normal appearance, 8.8 cm length

Right Kidney: Length: 12.3 cm. Normal morphology without mass or
hydronephrosis.

Left Kidney: Length: 12.2 cm. Normal morphology without mass or
hydronephrosis.

Abdominal aorta: Mild plaque.  Normal caliber.

Other findings: No RIGHT upper quadrant free fluid.

ULTRASOUND HEPATIC ELASTOGRAPHY

Device: Siemens Helix VTQ

Patient position: Supine

Transducer [id number]

Number of measurements: 10

Hepatic segment:  8

Median kPa:

IQR:

IQR/Median kPa ratio:

Data quality:  Good

Diagnostic category:  < or = 5 kPa: high probability of being normal

The use of hepatic elastography is applicable to patients with viral
hepatitis and non-alcoholic fatty liver disease. At this time, there
is insufficient data for the referenced cut-off values and use in
other causes of liver disease, including alcoholic liver disease.
Patients, however, may be assessed by elastography and serve as
their own reference standard/baseline.

In patients with non-alcoholic liver disease, the values suggesting
compensated advanced chronic liver disease (cACLD) may be lower, and
patients may need additional testing with elasticity results of [DATE]
kPa.

Please note that abnormal hepatic elasticity and shear wave
velocities may also be identified in clinical settings other than
with hepatic fibrosis, such as: acute hepatitis, elevated right
heart and central venous pressures including use of beta blockers,
Cristi disease (Klpigbb), infiltrative processes such as
mastocytosis/amyloidosis/infiltrative tumor/lymphoma, extrahepatic
cholestasis, with hyperemia in the post-prandial state, and with
liver transplantation. Correlation with patient history, laboratory
data, and clinical condition recommended.

Diagnostic Categories:

< or =5 kPa: high probability of being normal

< or =9 kPa: in the absence of other known clinical signs, rules [DATE] kPa and ?13 kPa: suggestive of cACLD, but needs further testing

>13 kPa: highly suggestive of cACLD

> or =17 kPa: highly suggestive of cACLD with an increased
probability of clinically significant portal hypertension
IMPRESSION: ULTRASOUND ABDOMEN:

Questionable tiny gallstone versus artifact at gallbladder neck.

Remaining upper abdominal ultrasound unremarkable.

ULTRASOUND HEPATIC ELASTOGRAPHY:

Median kPa:

Diagnostic category:  < or = 5 kPa: high probability of being normal

## 2020-10-11 ENCOUNTER — Telehealth: Payer: Self-pay | Admitting: Family Medicine

## 2020-10-11 DIAGNOSIS — Z131 Encounter for screening for diabetes mellitus: Secondary | ICD-10-CM

## 2020-10-11 DIAGNOSIS — K760 Fatty (change of) liver, not elsewhere classified: Secondary | ICD-10-CM

## 2020-10-11 DIAGNOSIS — E785 Hyperlipidemia, unspecified: Secondary | ICD-10-CM

## 2020-10-11 DIAGNOSIS — Z8739 Personal history of other diseases of the musculoskeletal system and connective tissue: Secondary | ICD-10-CM

## 2020-10-11 NOTE — Telephone Encounter (Signed)
Last labs completed 04/05/20 hepatic and lipid. Please advise. Thank you

## 2020-10-11 NOTE — Telephone Encounter (Signed)
Patient has appointment for 9/26 6 month follow up and needing labs done. He was last seen 02/03/20.

## 2020-10-11 NOTE — Telephone Encounter (Signed)
Lipid, liver, metabolic 7, uric acid

## 2020-10-12 NOTE — Telephone Encounter (Signed)
Blood work ordered in Epic. Patient notified. 

## 2020-10-14 DIAGNOSIS — Z8739 Personal history of other diseases of the musculoskeletal system and connective tissue: Secondary | ICD-10-CM | POA: Diagnosis not present

## 2020-10-14 DIAGNOSIS — K760 Fatty (change of) liver, not elsewhere classified: Secondary | ICD-10-CM | POA: Diagnosis not present

## 2020-10-14 DIAGNOSIS — Z131 Encounter for screening for diabetes mellitus: Secondary | ICD-10-CM | POA: Diagnosis not present

## 2020-10-14 DIAGNOSIS — E785 Hyperlipidemia, unspecified: Secondary | ICD-10-CM | POA: Diagnosis not present

## 2020-10-15 LAB — HEPATIC FUNCTION PANEL
ALT: 12 IU/L (ref 0–44)
AST: 15 IU/L (ref 0–40)
Albumin: 4.1 g/dL (ref 3.8–4.8)
Alkaline Phosphatase: 67 IU/L (ref 44–121)
Bilirubin Total: 0.3 mg/dL (ref 0.0–1.2)
Bilirubin, Direct: 0.11 mg/dL (ref 0.00–0.40)
Total Protein: 6.3 g/dL (ref 6.0–8.5)

## 2020-10-15 LAB — BASIC METABOLIC PANEL
BUN/Creatinine Ratio: 23 (ref 10–24)
BUN: 20 mg/dL (ref 8–27)
CO2: 25 mmol/L (ref 20–29)
Calcium: 9.6 mg/dL (ref 8.6–10.2)
Chloride: 104 mmol/L (ref 96–106)
Creatinine, Ser: 0.86 mg/dL (ref 0.76–1.27)
Glucose: 101 mg/dL — ABNORMAL HIGH (ref 65–99)
Potassium: 4 mmol/L (ref 3.5–5.2)
Sodium: 144 mmol/L (ref 134–144)
eGFR: 96 mL/min/{1.73_m2} (ref >59)

## 2020-10-15 LAB — LIPID PANEL
Chol/HDL Ratio: 2.5 ratio (ref 0.0–5.0)
Cholesterol, Total: 150 mg/dL (ref 100–199)
HDL: 60 mg/dL (ref >39)
LDL Chol Calc (NIH): 81 mg/dL (ref 0–99)
Triglycerides: 38 mg/dL (ref 0–149)
VLDL Cholesterol Cal: 9 mg/dL (ref 5–40)

## 2020-10-15 LAB — URIC ACID: Uric Acid: 5.6 mg/dL (ref 3.8–8.4)

## 2020-10-24 ENCOUNTER — Other Ambulatory Visit: Payer: Self-pay | Admitting: Family Medicine

## 2020-10-31 ENCOUNTER — Ambulatory Visit (INDEPENDENT_AMBULATORY_CARE_PROVIDER_SITE_OTHER): Payer: Medicare HMO | Admitting: Family Medicine

## 2020-10-31 ENCOUNTER — Other Ambulatory Visit: Payer: Self-pay

## 2020-10-31 VITALS — BP 128/68 | HR 76 | Ht 72.0 in | Wt 213.8 lb

## 2020-10-31 DIAGNOSIS — Z8739 Personal history of other diseases of the musculoskeletal system and connective tissue: Secondary | ICD-10-CM

## 2020-10-31 DIAGNOSIS — I700 Atherosclerosis of aorta: Secondary | ICD-10-CM

## 2020-10-31 MED ORDER — PRAVASTATIN SODIUM 10 MG PO TABS: ORAL_TABLET | ORAL | 0 refills | Status: DC

## 2020-10-31 NOTE — Progress Notes (Signed)
   Subjective:    Patient ID: Jared Bentley, male    DOB: October 08, 1955, 65 y.o.   MRN: [id number]  Hyperlipidemia This is a chronic problem. The current episode started more than 1 year ago. Treatments tried: pravachol. There are no compliance problems.    History of gout takes his medicine regular basis watch his diet keeps active keep his weight under control  Relates he started having significant pain and discomfort in his wrists and his knee with pravastatin every day he has stopped taking it 2 months ago and he states he is no longer having pain he is aware that he has aortic atherosclerosis he does not want that to progress he is willing to go ahead with trying a medication 3 days a week Discuss recent labs This Results for orders placed or performed in visit on 10/11/20  Lipid panel  Result Value Ref Range   Cholesterol, Total 150 100 - 199 mg/dL   Triglycerides 38 0 - 149 mg/dL   HDL 60 >39 mg/dL   VLDL Cholesterol Cal 9 5 - 40 mg/dL   LDL Chol Calc (NIH) 81 0 - 99 mg/dL   Chol/HDL Ratio 2.5 0.0 - 5.0 ratio  Hepatic function panel  Result Value Ref Range   Total Protein 6.3 6.0 - 8.5 g/dL   Albumin 4.1 3.8 - 4.8 g/dL   Bilirubin Total 0.3 0.0 - 1.2 mg/dL   Bilirubin, Direct 0.11 0.00 - 0.40 mg/dL   Alkaline Phosphatase 67 44 - 121 IU/L   AST 15 0 - 40 IU/L   ALT 12 0 - 44 IU/L  Basic metabolic panel  Result Value Ref Range   Glucose 101 (H) 65 - 99 mg/dL   BUN 20 8 - 27 mg/dL   Creatinine, Ser 0.86 0.76 - 1.27 mg/dL   eGFR 96 >59 mL/min/1.73   BUN/Creatinine Ratio 23 10 - 24   Sodium 144 134 - 144 mmol/L   Potassium 4.0 3.5 - 5.2 mmol/L   Chloride 104 96 - 106 mmol/L   CO2 25 20 - 29 mmol/L   Calcium 9.6 8.6 - 10.2 mg/dL  Uric acid  Result Value Ref Range   Uric Acid 5.6 3.8 - 8.4 mg/dL    Review of Systems     Objective:   Physical Exam  General-in no acute distress Eyes-no discharge Lungs-respiratory rate normal, CTA CV-no murmurs,RRR Extremities skin  warm dry no edema Neuro grossly normal Behavior normal, alert       Assessment & Plan:  Aortic atherosclerosis Continue pravastatin the patient will be taking it Monday Wednesday Friday because he cannot tolerate every single day  Gout under good control continue medication  Patient is due for wellness exam in November he elects to put this off to 6 months from now

## 2020-11-30 DIAGNOSIS — G4733 Obstructive sleep apnea (adult) (pediatric): Secondary | ICD-10-CM | POA: Diagnosis not present

## 2020-12-31 DIAGNOSIS — G4733 Obstructive sleep apnea (adult) (pediatric): Secondary | ICD-10-CM | POA: Diagnosis not present

## 2021-01-23 ENCOUNTER — Other Ambulatory Visit: Payer: Self-pay | Admitting: Family Medicine

## 2021-01-30 DIAGNOSIS — G4733 Obstructive sleep apnea (adult) (pediatric): Secondary | ICD-10-CM | POA: Diagnosis not present

## 2021-02-07 ENCOUNTER — Ambulatory Visit (INDEPENDENT_AMBULATORY_CARE_PROVIDER_SITE_OTHER): Payer: Medicare HMO

## 2021-02-07 ENCOUNTER — Other Ambulatory Visit: Payer: Self-pay

## 2021-02-07 VITALS — Ht 72.0 in | Wt 214.0 lb

## 2021-02-07 DIAGNOSIS — Z0000 Encounter for general adult medical examination without abnormal findings: Secondary | ICD-10-CM | POA: Diagnosis not present

## 2021-02-07 NOTE — Progress Notes (Signed)
Subjective:   Jared Bentley is a 66 y.o. male who presents for Medicare Annual/Subsequent preventive examination. Virtual Visit via Telephone Note  I connected with  Jared Bentley on 02/07/21 at  9:00 AM EST by telephone and verified that I am speaking with the correct person using two identifiers.  Location: Patient: Home Provider: [facility] Persons participating in the virtual visit: patient/Nurse Health Advisor   I discussed the limitations, risks, security and privacy concerns of performing an evaluation and management service by telephone and the availability of in person appointments. The patient expressed understanding and agreed to proceed.  Interactive audio and video telecommunications were attempted between this nurse and patient, however failed, due to patient having technical difficulties OR patient did not have access to video capability.  We continued and completed visit with audio only.  Some vital signs may be absent or patient reported.   Chriss Driver, LPN  Review of Systems     Cardiac Risk Factors include: advanced age (>68men, >34 women);dyslipidemia;male gender;sedentary lifestyle;Other (see comment), Risk factor comments: B-Cell Lymphoma    PHONE VISIT. PT AT HOME. NURSE AT [facility]. Objective:    Today's Vitals   02/07/21 0858  Weight: 214 lb (97.1 kg)  Height: 6' (1.829 m)   Body mass index is 29.02 kg/m.  Advanced Directives 02/07/2021 01/23/2017 01/17/2017 10/31/2015 04/28/2015 04/04/2015 12/28/2014  Does Patient Have a Medical Advance Directive? Yes No;Yes No Yes Yes No Yes  Type of Paramedic of [place];Living will Living will - [place];Living will [place];Living will - [place];Living will  Does patient want to make changes to medical advance directive? - - - No - Patient declined No - Patient declined - No - Patient declined  Copy of [place] in  Chart? No - copy requested - - No - copy requested No - copy requested - No - copy requested  Would patient like information on creating a medical advance directive? No - Patient declined - No - Patient declined - - No - patient declined information -    Current Medications (verified) Outpatient Encounter Medications as of 02/07/2021  Medication Sig   allopurinol (ZYLOPRIM) 100 MG tablet TAKE 1 TABLET BY MOUTH EVERY DAY   clobetasol cream (TEMOVATE) 0.96 % Apply 1 application topically 2 (two) times daily.   DRITHO-CREME HP 1 % cream APPLY TO AFFECTED AREA TOPICALLY EVERY DAY   hydrocortisone 2.5 % ointment Apply topically 2 (two) times daily.   naproxen sodium (ALEVE) 220 MG tablet Take 220 mg by mouth 2 (two) times daily as needed (for pain or headache).   pravastatin (PRAVACHOL) 10 MG tablet One Tablet on Monday, Wednesday and Friday   sildenafil (REVATIO) 20 MG tablet Take 2 tablets po as needed for sex   No facility-administered encounter medications on file as of 02/07/2021.    Allergies (verified) Patient has no known allergies.   History: Past Medical History:  Diagnosis Date   Anxiety    Carpal tunnel syndrome    Chronic back pain    Diffuse large B cell lymphoma ([place]) 12/30/2013   ED (erectile dysfunction)    Glucose intolerance (impaired glucose tolerance)    Heavy alcohol consumption    6-12 beers daily   Hypertension    Sleep apnea    severe OSA-uses a cpap   Past Surgical History:  Procedure Laterality Date   CARPAL TUNNEL RELEASE Left 07/29/2014   Procedure:  CARPAL TUNNEL RELEASE;  Surgeon: Sanjuana Kava, MD;  Location: [facility];  Service: Orthopedics;  Laterality: Left;   CARPAL TUNNEL RELEASE Right 11/16/2014   Procedure: CARPAL TUNNEL RELEASE;  Surgeon: Sanjuana Kava, MD;  Location: [facility];  Service: Orthopedics;  Laterality: Right;   COLONOSCOPY W/ POLYPECTOMY     COLONOSCOPY WITH PROPOFOL N/A 01/23/2017   Dr. Gala Romney: normal colon.  Next colonoscopy in 10 years    LYMPH NODE BIOPSY Left 12/22/2013   Procedure: LEFT INGUINAL LYMPH NODE BIOPSY;  Surgeon: [name], MD;  Location: [facility];  Service: General;  Laterality: Left;   PORT-A-CATH REMOVAL N/A 07/08/2014   Procedure: REMOVAL PORT-A-CATH;  Surgeon: [name], MD;  Location: [facility];  Service: General;  Laterality: N/A;   PORTACATH PLACEMENT N/A 01/06/2014   Procedure: INSERTION PORT-A-CATH;  Surgeon: [name], MD;  Location: [facility];  Service: General;  Laterality: N/A;   VASECTOMY     WISDOM TOOTH EXTRACTION     Family History  Problem Relation Age of Onset   Cancer Father        bone   Cancer Mother        tongue ca   Colon cancer Neg Hx    Social History   Socioeconomic History   Marital status: Married    Spouse name: Hilda Blades   Number of children: 1   Years of education: Not on file   Highest education level: Not on file  Occupational History   Not on file  Tobacco Use   Smoking status: Former    Packs/day: 1.00    Years: 30.00    Pack years: 30.00    Types: Cigarettes    Quit date: 05/14/2004    Years since quitting: 16.7   Smokeless tobacco: Never  Vaping Use   Vaping Use: Never used  Substance and Sexual Activity   Alcohol use: Yes    Comment: daily-6-12 beer; 10/29/18 "2 beers/day as of 06/04/19   Drug use: No   Sexual activity: Yes    Birth control/protection: None  Other Topics Concern   Not on file  Social History Narrative   Retired.  [place] [address] [place].     1 daughter   Married x 48 years in 2022.   Social Determinants of Health   Financial Resource Strain: Low Risk    Difficulty of Paying Living Expenses: Not very hard  Food Insecurity: No Food Insecurity   Worried About Charity fundraiser in the Last Year: Never true   Ran Out of Food in the Last Year: Never true  Transportation Needs: No Transportation Needs   Lack of Transportation (Medical): No   Lack of Transportation (Non-Medical): No  Physical  Activity: Insufficiently Active   Days of Exercise per Week: 3 days   Minutes of Exercise per Session: 20 min  Stress: No Stress Concern Present   Feeling of Stress : Not at all  Social Connections: Moderately Isolated   Frequency of Communication with Friends and Family: Three times a week   Frequency of Social Gatherings with Friends and Family: More than three times a week   Attends Religious Services: Never   Marine scientist or Organizations: No   Attends Music therapist: Never   Marital Status: Married    Tobacco Counseling Counseling given: Not Answered   Clinical Intake:  Pre-visit preparation completed: Yes  Pain : No/denies pain     BMI - recorded: 29.02 Nutritional  Status: BMI 25 -29 Overweight Nutritional Risks: None Diabetes: No  How often do you need to have someone help you when you read instructions, pamphlets, or other written materials from your doctor or pharmacy?: 1 - Never  Diabetic?No  Interpreter Needed?: No  Information entered by :: MJ Errika Narvaiz, LPN   Activities of Daily Living In your present state of health, do you have any difficulty performing the following activities: 02/06/2021  Hearing? N  Vision? N  Difficulty concentrating or making decisions? N  Walking or climbing stairs? N  Dressing or bathing? N  Doing errands, shopping? N  Preparing Food and eating ? N  Using the Toilet? N  In the past six months, have you accidently leaked urine? N  Do you have problems with loss of bowel control? N  Managing your Medications? N  Managing your Finances? N  Housekeeping or managing your Housekeeping? N  Some recent data might be hidden    Patient Care Team: Kathyrn Drown, MD as PCP - General (Family Medicine) Fay Records, MD as Consulting Physician (Cardiology) Carola Frost, RN as Registered Nurse Lavonna Monarch, MD as Consulting Physician (Dermatology)  Indicate any recent Medical Services you may have  received from other than [facility] providers in the past year (date may be approximate).     Assessment:   This is a routine wellness examination for Leticia.  Hearing/Vision screen Hearing Screening - Comments:: No hearing issues. Vision Screening - Comments:: [facility]ers. 2022. [facility]  Dietary issues and exercise activities discussed: Current Exercise Habits: The patient does not participate in regular exercise at present, Exercise limited by: cardiac condition(s);orthopedic condition(s)   Goals Addressed             This Visit's Progress    Exercise 3x per week (30 min per time)       Increase exercise as tolerated.        Depression Screen PHQ 2/9 Scores 02/07/2021 10/31/2020 02/03/2020 12/14/2019 09/09/2018 08/27/2017 08/27/2017  PHQ - 2 Score 0 0 0 0 0 0 0  PHQ- 9 Score - - - - 0 - -    Fall Risk Fall Risk  02/07/2021 02/06/2021 10/31/2020 02/03/2020 08/27/2017  Falls in the past year? 0 0 0 0 No  Number falls in past yr: 0 0 - - -  Injury with Fall? 0 0 - - -  Risk for fall due to : Impaired mobility - No Fall Risks - -  Follow up Falls prevention discussed - Falls evaluation completed Falls evaluation completed -    FALL RISK PREVENTION PERTAINING TO THE HOME:  Any stairs in or around the home? No  If so, are there any without handrails? No  Home free of loose throw rugs in walkways, pet beds, electrical cords, etc? Yes  Adequate lighting in your home to reduce risk of falls? Yes   ASSISTIVE DEVICES UTILIZED TO PREVENT FALLS:  Life alert? No  Use of a cane, walker or w/c? No  Grab bars in the bathroom? Yes  Shower chair or bench in shower? Yes  Elevated toilet seat or a handicapped toilet? No   TIMED UP AND GO:  Was the test performed? No .  Phone visit. Cognitive Function:     6CIT Screen 02/07/2021  What Year? 0 points  What month? 0 points  What time? 0 points  Count back from 20 0 points  Months in reverse 4 points  Repeat phrase 0 points  Total Score  4    Immunizations Immunization History  Administered Date(s) Administered   Fluad Quad(high Dose 65+) 11/13/2020   Hepatitis A 11/27/2018   Hepatitis A, Adult 11/23/2018   Influenza Inj Mdck Quad Pf 10/22/2018   Influenza,inj,Quad PF,6+ Mos 12/30/2013, 11/01/2016   Influenza-Unspecified 12/01/2014, 11/03/2015, 12/20/2016, 10/26/2018, 11/20/2019, 11/13/2020   PFIZER(Purple Top)SARS-COV-2 Vaccination 04/30/2019, 05/23/2019, 12/29/2019, 11/24/2020   Pfizer Covid-19 Vaccine Bivalent Booster 22yrs & up 11/24/2020   Pneumococcal Conjugate-13 12/30/2013   Pneumococcal Polysaccharide-23 02/28/2016   Td 06/30/2013   Zoster Recombinat (Shingrix) 08/28/2017, 11/11/2017, 01/11/2018    TDAP status: Up to date  Flu Vaccine status: Up to date  Pneumococcal vaccine status: Up to date  Covid-19 vaccine status: Completed vaccines  Qualifies for Shingles Vaccine? Yes   Zostavax completed Yes   Shingrix Completed?: Yes  Screening Tests Health Maintenance  Topic Date Due   COVID-19 Vaccine (5 - Booster for Pfizer series) 01/19/2021   Pneumonia Vaccine 47+ Years old (3 - PPSV23 if available, else PCV20) 02/27/2021   TETANUS/TDAP  07/01/2023   COLONOSCOPY (Pts 45-32yrs Insurance coverage will need to be confirmed)  01/24/2027   INFLUENZA VACCINE  Completed   Hepatitis C Screening  Completed   Zoster Vaccines- Shingrix  Completed   HPV VACCINES  Aged Out    Health Maintenance  Health Maintenance Due  Topic Date Due   COVID-19 Vaccine (5 - Booster for Pfizer series) 01/19/2021   Pneumonia Vaccine 31+ Years old (3 - PPSV23 if available, else PCV20) 02/27/2021    Colorectal cancer screening: Type of screening: Colonoscopy. Completed 01/23/2017. Repeat every 10 years  Lung Cancer Screening: (Low Dose CT Chest recommended if Age 26-80 years, 30 pack-year currently smoking OR have quit w/in 15years.) does qualify.    Additional Screening:  Hepatitis C Screening: does qualify; Completed  10/21/2013  Vision Screening: Recommended annual ophthalmology exams for early detection of glaucoma and other disorders of the eye. Is the patient up to date with their annual eye exam?  Yes  Who is the provider or what is the name of the office in which the patient attends annual eye exams? [facility] If pt is not established with a provider, would they like to be referred to a provider to establish care? No .   Dental Screening: Recommended annual dental exams for proper oral hygiene  Community Resource Referral / Chronic Care Management: CRR required this visit?  No   CCM required this visit?  No      Plan:     I have personally reviewed and noted the following in the patients chart:   Medical and social history Use of alcohol, tobacco or illicit drugs  Current medications and supplements including opioid prescriptions. Patient is not currently taking opioid prescriptions. Functional ability and status Nutritional status Physical activity Advanced directives List of other physicians Hospitalizations, surgeries, and ER visits in previous 12 months Vitals Screenings to include cognitive, depression, and falls Referrals and appointments  In addition, I have reviewed and discussed with patient certain preventive protocols, quality metrics, and best practice recommendations. A written personalized care plan for preventive services as well as general preventive health recommendations were provided to patient.     Chriss Driver, LPN   07/13/5447   Nurse Notes: Pt is up to date on all health maintenance and vaccines.

## 2021-02-07 NOTE — Patient Instructions (Signed)
[name] , Thank you for taking time to come for your Medicare Wellness Visit. I appreciate your ongoing commitment to your health goals. Please review the following plan we discussed and let me know if I can assist you in the future.   Screening recommendations/referrals: Colonoscopy: Done 01/23/2017 Repeat in 10 years  Recommended yearly ophthalmology/optometry visit for glaucoma screening and checkup Recommended yearly dental visit for hygiene and checkup  Vaccinations: Influenza vaccine: Done 11/13/2020 Repeat annually  Pneumococcal vaccine: Done 12/30/2013 and 02/28/2016 Tdap vaccine: Done 06/30/2013 Repeat in 10 years  Shingles vaccine: done 08/30/2017, 11/11/2017 and 01/12/2011.   Covid-19: Done 04/30/2019, 05/23/2019, 12/29/2019 and 11/24/2020.  Advanced directives: Please bring a copy of your health care power of attorney and living will to the office to be added to your chart at your convenience.   Conditions/risks identified: Aim for 30 minutes of exercise or brisk walking each day, drink 6-8 glasses of water and eat lots of fruits and vegetables.   Next appointment: Follow up in one year for your annual wellness visit. 02/13/2022 @ 9:00 am.   Preventive Care 65 Years and Older, Male  Preventive care refers to lifestyle choices and visits with your health care provider that can promote health and wellness. What does preventive care include? A yearly physical exam. This is also called an annual well check. Dental exams once or twice a year. Routine eye exams. Ask your health care provider how often you should have your eyes checked. Personal lifestyle choices, including: Daily care of your teeth and gums. Regular physical activity. Eating a healthy diet. Avoiding tobacco and drug use. Limiting alcohol use. Practicing safe sex. Taking low doses of aspirin every day. Taking vitamin and mineral supplements as recommended by your health care provider. What happens during an  annual well check? The services and screenings done by your health care provider during your annual well check will depend on your age, overall health, lifestyle risk factors, and family history of disease. Counseling  Your health care provider may ask you questions about your: Alcohol use. Tobacco use. Drug use. Emotional well-being. Home and relationship well-being. Sexual activity. Eating habits. History of falls. Memory and ability to understand (cognition). Work and work Statistician. Screening  You may have the following tests or measurements: Height, weight, and BMI. Blood pressure. Lipid and cholesterol levels. These may be checked every 5 years, or more frequently if you are over 73 years old. Skin check. Lung cancer screening. You may have this screening every year starting at age 80 if you have a 30-pack-year history of smoking and currently smoke or have quit within the past 15 years. Fecal occult blood test (FOBT) of the stool. You may have this test every year starting at age 81. Flexible sigmoidoscopy or colonoscopy. You may have a sigmoidoscopy every 5 years or a colonoscopy every 10 years starting at age 18. Prostate cancer screening. Recommendations will vary depending on your family history and other risks. Hepatitis C blood test. Hepatitis B blood test. Sexually transmitted disease (STD) testing. Diabetes screening. This is done by checking your blood sugar (glucose) after you have not eaten for a while (fasting). You may have this done every 1-3 years. Abdominal aortic aneurysm (AAA) screening. You may need this if you are a current or former smoker. Osteoporosis. You may be screened starting at age 71 if you are at high risk. Talk with your health care provider about your test results, treatment options, and if necessary, the need for  more tests. Vaccines  Your health care provider may recommend certain vaccines, such as: Influenza vaccine. This is recommended  every year. Tetanus, diphtheria, and acellular pertussis (Tdap, Td) vaccine. You may need a Td booster every 10 years. Zoster vaccine. You may need this after age 88. Pneumococcal 13-valent conjugate (PCV13) vaccine. One dose is recommended after age 11. Pneumococcal polysaccharide (PPSV23) vaccine. One dose is recommended after age 47. Talk to your health care provider about which screenings and vaccines you need and how often you need them. This information is not intended to replace advice given to you by your health care provider. Make sure you discuss any questions you have with your health care provider. Document Released: 02/18/2015 Document Revised: 10/12/2015 Document Reviewed: 11/23/2014 Elsevier Interactive Patient Education  2017 [place] Prevention in the Home Falls can cause injuries. They can happen to people of all ages. There are many things you can do to make your home safe and to help prevent falls. What can I do on the outside of my home? Regularly fix the edges of walkways and driveways and fix any cracks. Remove anything that might make you trip as you walk through a door, such as a raised step or threshold. Trim any bushes or trees on the path to your home. Use bright outdoor lighting. Clear any walking paths of anything that might make someone trip, such as rocks or tools. Regularly check to see if handrails are loose or broken. Make sure that both sides of any steps have handrails. Any raised decks and porches should have guardrails on the edges. Have any leaves, snow, or ice cleared regularly. Use sand or salt on walking paths during winter. Clean up any spills in your garage right away. This includes oil or grease spills. What can I do in the bathroom? Use night lights. Install grab bars by the toilet and in the tub and shower. Do not use towel bars as grab bars. Use non-skid mats or decals in the tub or shower. If you need to sit down in the shower,  use a plastic, non-slip stool. Keep the floor dry. Clean up any water that spills on the floor as soon as it happens. Remove soap buildup in the tub or shower regularly. Attach bath mats securely with double-sided non-slip rug tape. Do not have throw rugs and other things on the floor that can make you trip. What can I do in the bedroom? Use night lights. Make sure that you have a light by your bed that is easy to reach. Do not use any sheets or blankets that are too big for your bed. They should not hang down onto the floor. Have a firm chair that has side arms. You can use this for support while you get dressed. Do not have throw rugs and other things on the floor that can make you trip. What can I do in the kitchen? Clean up any spills right away. Avoid walking on wet floors. Keep items that you use a lot in easy-to-reach places. If you need to reach something above you, use a strong step stool that has a grab bar. Keep electrical cords out of the way. Do not use floor polish or wax that makes floors slippery. If you must use wax, use non-skid floor wax. Do not have throw rugs and other things on the floor that can make you trip. What can I do with my stairs? Do not leave any items on the stairs. Make  sure that there are handrails on both sides of the stairs and use them. Fix handrails that are broken or loose. Make sure that handrails are as long as the stairways. Check any carpeting to make sure that it is firmly attached to the stairs. Fix any carpet that is loose or worn. Avoid having throw rugs at the top or bottom of the stairs. If you do have throw rugs, attach them to the floor with carpet tape. Make sure that you have a light switch at the top of the stairs and the bottom of the stairs. If you do not have them, ask someone to add them for you. What else can I do to help prevent falls? Wear shoes that: Do not have high heels. Have rubber bottoms. Are comfortable and fit you  well. Are closed at the toe. Do not wear sandals. If you use a stepladder: Make sure that it is fully opened. Do not climb a closed stepladder. Make sure that both sides of the stepladder are locked into place. Ask someone to hold it for you, if possible. Clearly mark and make sure that you can see: Any grab bars or handrails. First and last steps. Where the edge of each step is. Use tools that help you move around (mobility aids) if they are needed. These include: Canes. Walkers. Scooters. Crutches. Turn on the lights when you go into a dark area. Replace any light bulbs as soon as they burn out. Set up your furniture so you have a clear path. Avoid moving your furniture around. If any of your floors are uneven, fix them. If there are any pets around you, be aware of where they are. Review your medicines with your doctor. Some medicines can make you feel dizzy. This can increase your chance of falling. Ask your doctor what other things that you can do to help prevent falls. This information is not intended to replace advice given to you by your health care provider. Make sure you discuss any questions you have with your health care provider. Document Released: 11/18/2008 Document Revised: 06/30/2015 Document Reviewed: 02/26/2014 Elsevier Interactive Patient Education  2017 [employer].

## 2021-02-23 DIAGNOSIS — G4733 Obstructive sleep apnea (adult) (pediatric): Secondary | ICD-10-CM | POA: Diagnosis not present

## 2021-03-02 DIAGNOSIS — G4733 Obstructive sleep apnea (adult) (pediatric): Secondary | ICD-10-CM | POA: Diagnosis not present

## 2021-03-26 DIAGNOSIS — G4733 Obstructive sleep apnea (adult) (pediatric): Secondary | ICD-10-CM | POA: Diagnosis not present

## 2021-04-22 ENCOUNTER — Other Ambulatory Visit: Payer: Self-pay | Admitting: Family Medicine

## 2021-05-01 ENCOUNTER — Encounter: Payer: Self-pay | Admitting: Family Medicine

## 2021-05-01 ENCOUNTER — Ambulatory Visit (INDEPENDENT_AMBULATORY_CARE_PROVIDER_SITE_OTHER): Payer: Medicare HMO | Admitting: Family Medicine

## 2021-05-01 ENCOUNTER — Other Ambulatory Visit: Payer: Self-pay

## 2021-05-01 VITALS — BP 114/70 | Ht 72.0 in | Wt 217.2 lb

## 2021-05-01 DIAGNOSIS — Z0000 Encounter for general adult medical examination without abnormal findings: Secondary | ICD-10-CM

## 2021-05-01 DIAGNOSIS — Z125 Encounter for screening for malignant neoplasm of prostate: Secondary | ICD-10-CM | POA: Diagnosis not present

## 2021-05-01 DIAGNOSIS — Z8739 Personal history of other diseases of the musculoskeletal system and connective tissue: Secondary | ICD-10-CM

## 2021-05-01 DIAGNOSIS — Z79899 Other long term (current) drug therapy: Secondary | ICD-10-CM | POA: Diagnosis not present

## 2021-05-01 DIAGNOSIS — Z23 Encounter for immunization: Secondary | ICD-10-CM | POA: Diagnosis not present

## 2021-05-01 DIAGNOSIS — E785 Hyperlipidemia, unspecified: Secondary | ICD-10-CM | POA: Diagnosis not present

## 2021-05-01 MED ORDER — PRAVASTATIN SODIUM 10 MG PO TABS: ORAL_TABLET | ORAL | 3 refills | Status: DC

## 2021-05-01 MED ORDER — ALLOPURINOL 100 MG PO TABS
100.0000 mg | ORAL_TABLET | Freq: Every day | ORAL | 3 refills | Status: DC
Start: 2021-05-01 — End: 2022-05-10

## 2021-05-01 NOTE — Progress Notes (Signed)
? ?  Subjective:  ? ? Patient ID: Jared Bentley, male    DOB: 06/01/1955, 66 y.o.   MRN: [id number] ? ?HPI ? ?Patient arrives for a physical- Patient had Medicare Wellness exam through nurse health advisor January 2023 ? ?The patient comes in today for a wellness visit. ? ? ? ?A review of their health history was completed. ? A review of medications was also completed. ? ?Any needed refills; yes- pravastatin, allopurinol ?Relates he tries eat healthy ?Takes his medicine ? ?Eating habits: trying to eat healthy ? ?Falls/  MVA accidents in past few months: no ? ?Regular exercise: yes ? ?Specialist pt sees on regular basis: none ? ?Preventative health issues were discussed.  ? ?Additional concerns: check spot on tongue  ?This is a raised area.  It feels like a small bump/skin tag causing him some issues.  Mother died with oral cancer years ago ? ?Review of Systems ? ?   ?Objective:  ? Physical Exam ?General-in no acute distress ?Eyes-no discharge ?Lungs-respiratory rate normal, CTA ?CV-no murmurs,RRR ?Extremities skin warm dry no edema ?Neuro grossly normal ?Behavior normal, alert ? ? ? ? ?   ?Assessment & Plan:  ?1. Well adult exam ?Adult wellness-complete.wellness physical was conducted today. Importance of diet and exercise were discussed in detail.  ?In addition to this a discussion regarding safety was also covered. We also reviewed over immunizations and gave recommendations regarding current immunization needed for age.  ?In addition to this additional areas were also touched on including: ?Preventative health exams needed: ? ?Colonoscopy 2028 ? ?Patient was advised yearly wellness exam ? ?2. Need for vaccination ?Today ?- Pneumococcal conjugate vaccine 20-valent ? ?3. Screening PSA (prostate specific antigen) ?Today ?- PSA ? ?4. Hyperlipidemia, unspecified hyperlipidemia type ?Continue medication 2 days/week ?- Lipid panel ? ?5. History of gout ?Continue medication check uric acid ?- Uric acid ? ?6. High risk  medication use ?Because medication check labs ?- Hepatic function panel ?- Basic metabolic panel ? ?If overall doing well follow-up 6 months follow-up sooner if any problems ? ?Tongue lesion-unlikely to be cancer but cannot totally rule out pathology we will touch base with ENT patient would benefit from consultation ?

## 2021-05-02 DIAGNOSIS — E785 Hyperlipidemia, unspecified: Secondary | ICD-10-CM | POA: Diagnosis not present

## 2021-05-02 DIAGNOSIS — Z8739 Personal history of other diseases of the musculoskeletal system and connective tissue: Secondary | ICD-10-CM | POA: Diagnosis not present

## 2021-05-02 DIAGNOSIS — Z79899 Other long term (current) drug therapy: Secondary | ICD-10-CM | POA: Diagnosis not present

## 2021-05-02 DIAGNOSIS — Z125 Encounter for screening for malignant neoplasm of prostate: Secondary | ICD-10-CM | POA: Diagnosis not present

## 2021-05-03 LAB — BASIC METABOLIC PANEL
BUN/Creatinine Ratio: 23 (ref 10–24)
BUN: 19 mg/dL (ref 8–27)
CO2: 24 mmol/L (ref 20–29)
Calcium: 9.1 mg/dL (ref 8.6–10.2)
Chloride: 104 mmol/L (ref 96–106)
Creatinine, Ser: 0.82 mg/dL (ref 0.76–1.27)
Glucose: 91 mg/dL (ref 70–99)
Potassium: 4 mmol/L (ref 3.5–5.2)
Sodium: 142 mmol/L (ref 134–144)
eGFR: 97 mL/min/{1.73_m2} (ref >59)

## 2021-05-03 LAB — LIPID PANEL
Chol/HDL Ratio: 2.2 ratio (ref 0.0–5.0)
Cholesterol, Total: 119 mg/dL (ref 100–199)
HDL: 53 mg/dL (ref >39)
LDL Chol Calc (NIH): 55 mg/dL (ref 0–99)
Triglycerides: 46 mg/dL (ref 0–149)
VLDL Cholesterol Cal: 11 mg/dL (ref 5–40)

## 2021-05-03 LAB — PSA: Prostate Specific Ag, Serum: 0.7 ng/mL (ref 0.0–4.0)

## 2021-05-03 LAB — HEPATIC FUNCTION PANEL
ALT: 16 IU/L (ref 0–44)
AST: 21 IU/L (ref 0–40)
Albumin: 4.2 g/dL (ref 3.8–4.8)
Alkaline Phosphatase: 71 IU/L (ref 44–121)
Bilirubin Total: 0.3 mg/dL (ref 0.0–1.2)
Bilirubin, Direct: 0.12 mg/dL (ref 0.00–0.40)
Total Protein: 6.2 g/dL (ref 6.0–8.5)

## 2021-05-03 LAB — URIC ACID: Uric Acid: 4.9 mg/dL (ref 3.8–8.4)

## 2021-05-04 ENCOUNTER — Telehealth: Payer: Self-pay | Admitting: Family Medicine

## 2021-05-04 DIAGNOSIS — Q383 Other congenital malformations of tongue: Secondary | ICD-10-CM

## 2021-05-04 NOTE — Telephone Encounter (Signed)
Nurses ?Patient had a enlarged area on his tongue that he was concerned about.  Please let patient know that ENT states they would need to see him in order to definitively know what is causing this.  I would recommend referral to Dr.Teoh-should be noted that patient's mother died of oral cancer.  It is unlikely that this is cancer but it should get checked out to be safe.  Please go ahead with referral and talk with patient thank you ?

## 2021-05-04 NOTE — Telephone Encounter (Signed)
Patient informed of md message and recommendations. Verbalized understanding.  Patient states to go ahead with referral to Dr. Benjamine Mola. Referral placed in epic. ?

## 2021-05-24 DIAGNOSIS — G4733 Obstructive sleep apnea (adult) (pediatric): Secondary | ICD-10-CM | POA: Diagnosis not present

## 2021-06-23 DIAGNOSIS — G4733 Obstructive sleep apnea (adult) (pediatric): Secondary | ICD-10-CM | POA: Diagnosis not present

## 2021-07-24 DIAGNOSIS — G4733 Obstructive sleep apnea (adult) (pediatric): Secondary | ICD-10-CM | POA: Diagnosis not present

## 2021-08-14 DIAGNOSIS — E7800 Pure hypercholesterolemia, unspecified: Secondary | ICD-10-CM | POA: Diagnosis not present

## 2021-08-14 DIAGNOSIS — H5200 Hypermetropia, unspecified eye: Secondary | ICD-10-CM | POA: Diagnosis not present

## 2021-08-14 DIAGNOSIS — Z0100 Encounter for examination of eyes and vision without abnormal findings: Secondary | ICD-10-CM | POA: Diagnosis not present

## 2021-08-14 DIAGNOSIS — H35363 Drusen (degenerative) of macula, bilateral: Secondary | ICD-10-CM | POA: Diagnosis not present

## 2021-08-23 DIAGNOSIS — G4733 Obstructive sleep apnea (adult) (pediatric): Secondary | ICD-10-CM | POA: Diagnosis not present

## 2021-09-23 DIAGNOSIS — G4733 Obstructive sleep apnea (adult) (pediatric): Secondary | ICD-10-CM | POA: Diagnosis not present

## 2021-10-18 DIAGNOSIS — Z6829 Body mass index (BMI) 29.0-29.9, adult: Secondary | ICD-10-CM | POA: Diagnosis not present

## 2021-10-18 DIAGNOSIS — H6122 Impacted cerumen, left ear: Secondary | ICD-10-CM | POA: Diagnosis not present

## 2021-10-19 DIAGNOSIS — H6122 Impacted cerumen, left ear: Secondary | ICD-10-CM | POA: Diagnosis not present

## 2021-10-19 DIAGNOSIS — Z6829 Body mass index (BMI) 29.0-29.9, adult: Secondary | ICD-10-CM | POA: Diagnosis not present

## 2021-10-24 DIAGNOSIS — G4733 Obstructive sleep apnea (adult) (pediatric): Secondary | ICD-10-CM | POA: Diagnosis not present

## 2021-11-06 ENCOUNTER — Telehealth: Payer: Self-pay

## 2021-11-06 NOTE — Telephone Encounter (Signed)
Error

## 2021-11-07 ENCOUNTER — Ambulatory Visit (INDEPENDENT_AMBULATORY_CARE_PROVIDER_SITE_OTHER): Payer: Medicare HMO | Admitting: Family Medicine

## 2021-11-07 VITALS — BP 117/78 | HR 78 | Temp 96.7°F | Ht 72.0 in | Wt 218.0 lb

## 2021-11-07 DIAGNOSIS — M722 Plantar fascial fibromatosis: Secondary | ICD-10-CM

## 2021-11-07 MED ORDER — DICLOFENAC SODIUM 75 MG PO TBEC: 75.0000 mg | DELAYED_RELEASE_TABLET | Freq: Two times a day (BID) | ORAL | 0 refills | Status: DC

## 2021-11-07 MED ORDER — COLCHICINE 0.6 MG PO TABS: ORAL_TABLET | ORAL | 3 refills | Status: AC

## 2021-11-07 NOTE — Progress Notes (Signed)
   Subjective:    Patient ID: Jared Bentley, male    DOB: [birth year], 66 y.o.   MRN: [id number]  HPI Possible Gout flare left foot x 1 month Ball to heel  Started off in the forefront of his foot several weeks ago now is moved to the heel every time he stands up it causes pain worse first in the morning and after sitting for long span of time denies any numbness tingling no redness does not appear like the usual gout Review of Systems     Objective:   Physical Exam No swelling in the lower leg ankle is normal forefoot is normal no obvious acute gout going on Tenderness in the heel consistent with plantar fasciitis       Assessment & Plan:  Plantar fasciitis Stretching exercises given Diclofenac twice daily for up to 2 weeks If abdominal pain or discomfort stop medicine If not dramatically better within 2 weeks recommend referral to podiatry for injection

## 2021-11-07 NOTE — Patient Instructions (Signed)
Plantar Fasciitis  Plantar fasciitis is a painful foot condition that affects the heel. It occurs when the band of tissue that connects the toes to the heel bone (plantar fascia) becomes irritated. This can happen as the result of exercising too much or doing other repetitive activities (overuse injury). Plantar fasciitis can cause mild irritation to severe pain that makes it difficult to walk or move. The pain is usually worse in the morning after sleeping, or after sitting or lying down for a period of time. Pain may also be worse after long periods of walking or standing. What are the causes? This condition may be caused by: Standing for long periods of time. Wearing shoes that do not have good arch support. Doing activities that put stress on joints (high-impact activities). This includes ballet and exercise that makes your heart beat faster (aerobic exercise), such as running. Being overweight. An abnormal way of walking (gait). Tight muscles in the back of your lower leg (calf). High arches in your feet or flat feet. Starting a new athletic activity. What are the signs or symptoms? The main symptom of this condition is heel pain. Pain may get worse after the following: Taking the first steps after a time of rest, especially in the morning after awakening, or after you have been sitting or lying down for a while. Long periods of standing still. Pain may decrease after 30-45 minutes of activity, such as gentle walking. How is this diagnosed? This condition may be diagnosed based on your medical history, a physical exam, and your symptoms. Your health care provider will check for: A tender area on the bottom of your foot. A high arch in your foot or flat feet. Pain when you move your foot. Difficulty moving your foot. You may have imaging tests to confirm the diagnosis, such as: X-rays. Ultrasound. MRI. How is this treated? Treatment for plantar fasciitis depends on how severe your  condition is. Treatment may include: Rest, ice, pressure (compression), and raising (elevating) the affected foot. This is called RICE therapy. Your health care provider may recommend RICE therapy along with over-the-counter pain medicines to manage your pain. Exercises to stretch your calves and your plantar fascia. A splint that holds your foot in a stretched, upward position while you sleep (night splint). Physical therapy to relieve symptoms and prevent problems in the future. Injections of steroid medicine (cortisone) to relieve pain and inflammation. Stimulating your plantar fascia with electrical impulses (extracorporeal shock wave therapy). This is usually the last treatment option before surgery. Surgery, if other treatments have not worked after 12 months. Follow these instructions at home: Managing pain, stiffness, and swelling  If directed, put ice on the painful area. To do this: Put ice in a plastic bag, or use a frozen bottle of water. Place a towel between your skin and the bag or bottle. Roll the bottom of your foot over the bag or bottle. Do this for 20 minutes, 2-3 times a day. Wear athletic shoes that have air-sole or gel-sole cushions, or try soft shoe inserts that are designed for plantar fasciitis. Elevate your foot above the level of your heart while you are sitting or lying down. Activity Avoid activities that cause pain. Ask your health care provider what activities are safe for you. Do physical therapy exercises and stretches as told by your health care provider. Try activities and forms of exercise that are easier on your joints (low impact). Examples include swimming, water aerobics, and biking. General instructions Take over-the-counter   and prescription medicines only as told by your health care provider. Wear a night splint while sleeping, if told by your health care provider. Loosen the splint if your toes tingle, become numb, or turn cold and blue. Maintain a  healthy weight, or work with your health care provider to lose weight as needed. Keep all follow-up visits. This is important. Contact a health care provider if you have: Symptoms that do not go away with home treatment. Pain that gets worse. Pain that affects your ability to move or do daily activities. Summary Plantar fasciitis is a painful foot condition that affects the heel. It occurs when the band of tissue that connects the toes to the heel bone (plantar fascia) becomes irritated. Heel pain is the main symptom of this condition. It may get worse after exercising too much or standing still for a long time. Treatment varies, but it usually starts with rest, ice, pressure (compression), and raising (elevating) the affected foot. This is called RICE therapy. Over-the-counter medicines can also be used to manage pain. This information is not intended to replace advice given to you by your health care provider. Make sure you discuss any questions you have with your health care provider. Document Revised: 05/11/2019 Document Reviewed: 05/11/2019 Elsevier Patient Education  2023 [employer]  

## 2021-11-21 DIAGNOSIS — G4733 Obstructive sleep apnea (adult) (pediatric): Secondary | ICD-10-CM | POA: Diagnosis not present

## 2021-12-22 DIAGNOSIS — G4733 Obstructive sleep apnea (adult) (pediatric): Secondary | ICD-10-CM | POA: Diagnosis not present

## 2022-01-21 DIAGNOSIS — G4733 Obstructive sleep apnea (adult) (pediatric): Secondary | ICD-10-CM | POA: Diagnosis not present

## 2022-02-15 NOTE — Progress Notes (Signed)
Subjective:   Jared Bentley is a 67 y.o. male who presents for Medicare Annual/Subsequent preventive examination.  I connected with  [name] on 02/16/22 by a audio enabled telemedicine application and verified that I am speaking with the correct person using two identifiers.  Patient Location: Home  Provider Location: Office/Clinic  I discussed the limitations of evaluation and management by telemedicine. The patient expressed understanding and agreed to proceed.  Review of Systems     Cardiac Risk Factors include: advanced age (>45mn, >>1women);male gender     Objective:    Today's Vitals   02/16/22 0847  Weight: 220 lb (99.8 kg)  Height: 6' (1.829 m)   Body mass index is 29.84 kg/m.     02/16/2022    9:10 AM 02/07/2021    9:05 AM 01/23/2017   10:12 AM 01/17/2017   10:05 AM 10/31/2015   10:32 AM 04/28/2015    8:31 AM 04/04/2015    3:02 PM  Advanced Directives  Does Patient Have a Medical Advance Directive? Yes Yes No;Yes No Yes Yes No  Type of Advance Directive Living will;Healthcare Power of [place] will Living will  [place] will   Does patient want to make changes to medical advance directive? No - Patient declined    No - Patient declined No - Patient declined   Copy of [place] Chart? No - copy requested No - copy requested   No - copy requested No - copy requested   Would patient like information on creating a medical advance directive?  No - Patient declined  No - Patient declined   No - patient declined information    Current Medications (verified) Outpatient Encounter Medications as of 02/16/2022  Medication Sig   allopurinol (ZYLOPRIM) 100 MG tablet Take 1 tablet (100 mg total) by mouth daily.   colchicine 0.6 MG tablet 2 pills now then 1 bid prn for gout flare up   diclofenac (VOLTAREN) 75 MG EC tablet Take 1 tablet (75 mg  total) by mouth 2 (two) times daily.   naproxen sodium (ALEVE) 220 MG tablet Take 220 mg by mouth 2 (two) times daily as needed (for pain or headache).   pravastatin (PRAVACHOL) 10 MG tablet One Tablet on Monday and Friday   sildenafil (REVATIO) 20 MG tablet Take 2 tablets po as needed for sex   [DISCONTINUED] clobetasol cream (TEMOVATE) 08.93% Apply 1 application topically 2 (two) times daily. (Patient not taking: Reported on 11/07/2021)   [DISCONTINUED] DRITHO-CREME HP 1 % cream APPLY TO AFFECTED AREA TOPICALLY EVERY DAY (Patient not taking: Reported on 11/07/2021)   [DISCONTINUED] hydrocortisone 2.5 % ointment Apply topically 2 (two) times daily. (Patient not taking: Reported on 11/07/2021)   No facility-administered encounter medications on file as of 02/16/2022.    Allergies (verified) Patient has no known allergies.   History: Past Medical History:  Diagnosis Date   Anxiety    Carpal tunnel syndrome    Chronic back pain    Diffuse large B cell lymphoma (HEnsign 12/30/2013   ED (erectile dysfunction)    Glucose intolerance (impaired glucose tolerance)    Heavy alcohol consumption    6-12 beers daily   Hypertension    Sleep apnea    severe OSA-uses a cpap   Past Surgical History:  Procedure Laterality Date   CARPAL TUNNEL RELEASE Left 07/29/2014   Procedure: CARPAL TUNNEL RELEASE;  Surgeon:  Sanjuana Kava, MD;  Location: [facility];  Service: Orthopedics;  Laterality: Left;   CARPAL TUNNEL RELEASE Right 11/16/2014   Procedure: CARPAL TUNNEL RELEASE;  Surgeon: Sanjuana Kava, MD;  Location: [facility];  Service: Orthopedics;  Laterality: Right;   COLONOSCOPY W/ POLYPECTOMY     COLONOSCOPY WITH PROPOFOL N/A 01/23/2017   Dr. Gala Romney: normal colon.  Next colonoscopy in 10 years   KNEE SURGERY Left    approx 2020   LYMPH NODE BIOPSY Left 12/22/2013   Procedure: LEFT INGUINAL LYMPH NODE BIOPSY;  Surgeon: [name], MD;  Location: [place];  Service: General;  Laterality: Left;    PORT-A-CATH REMOVAL N/A 07/08/2014   Procedure: REMOVAL PORT-A-CATH;  Surgeon: [name], MD;  Location: [facility];  Service: General;  Laterality: N/A;   PORTACATH PLACEMENT N/A 01/06/2014   Procedure: INSERTION PORT-A-CATH;  Surgeon: [name], MD;  Location: [place];  Service: General;  Laterality: N/A;   VASECTOMY     WISDOM TOOTH EXTRACTION     Family History  Problem Relation Age of Onset   Cancer Father        bone   Cancer Mother        tongue ca   Colon cancer Neg Hx    Social History   Socioeconomic History   Marital status: Married    Spouse name: Hilda Blades   Number of children: 1   Years of education: Not on file   Highest education level: Not on file  Occupational History   Not on file  Tobacco Use   Smoking status: Former    Packs/day: 1.00    Years: 30.00    Total pack years: 30.00    Types: Cigarettes    Quit date: 05/14/2004    Years since quitting: 17.7   Smokeless tobacco: Never  Vaping Use   Vaping Use: Never used  Substance and Sexual Activity   Alcohol use: Yes    Comment: daily-6-12 beer; 10/29/18 "2 beers/day as of 06/04/19   Drug use: No   Sexual activity: Yes    Birth control/protection: None  Other Topics Concern   Not on file  Social History Narrative   Retired.  [place] [address] acres.     1 daughter   Married x 48 years in 2022.   Social Determinants of Health   Financial Resource Strain: Low Risk  (02/16/2022)   Overall Financial Resource Strain (CARDIA)    Difficulty of Paying Living Expenses: Not hard at all  Food Insecurity: No Food Insecurity (02/16/2022)   Hunger Vital Sign    Worried About Running Out of Food in the Last Year: Never true    Ran Out of Food in the Last Year: Never true  Transportation Needs: No Transportation Needs (02/16/2022)   PRAPARE - Hydrologist (Medical): No    Lack of Transportation (Non-Medical): No  Physical Activity: Inactive (02/16/2022)   Exercise  Vital Sign    Days of Exercise per Week: 0 days    Minutes of Exercise per Session: 0 min  Stress: No Stress Concern Present (02/16/2022)   [place]    Feeling of Stress : Not at all  Social Connections: Moderately Isolated (02/16/2022)   Social Connection and Isolation Panel [NHANES]    Frequency of Communication with Friends and Family: More than three times a week    Frequency of Social Gatherings with Friends and Family: Three  times a week    Attends Religious Services: Never    Active Member of Clubs or Organizations: No    Attends Music therapist: Never    Marital Status: Married    Tobacco Counseling Counseling given: Not Answered   Clinical Intake:  Pre-visit preparation completed: Yes  Pain : No/denies pain     Diabetes: No  How often do you need to have someone help you when you read instructions, pamphlets, or other written materials from your doctor or pharmacy?: 1 - Never  Diabetic?No   Interpreter Needed?: No  Information entered by :: Denman George LPN   Activities of Daily Living    02/16/2022    9:10 AM  In your present state of health, do you have any difficulty performing the following activities:  Hearing? 0  Vision? 0  Difficulty concentrating or making decisions? 0  Walking or climbing stairs? 0  Dressing or bathing? 0  Doing errands, shopping? 0  Preparing Food and eating ? N  Using the Toilet? N  In the past six months, have you accidently leaked urine? N  Do you have problems with loss of bowel control? N  Managing your Medications? N  Managing your Finances? N  Housekeeping or managing your Housekeeping? N    Patient Care Team: Kathyrn Drown, MD as PCP - General (Family Medicine) Fay Records, MD as Consulting Physician (Cardiology) Carola Frost, RN as Registered Nurse Lavonna Monarch, MD (Inactive) as Consulting Physician (Dermatology) Celestia Khat, OD (Optometry)  Indicate any recent Medical Services you may have received from other than [facility] providers in the past year (date may be approximate).     Assessment:   This is a routine wellness examination for [place].  Hearing/Vision screen Hearing Screening - Comments:: Denies hearing difficulties  Vision Screening - Comments:: up to date with routine eye exams with [facility] Dr. Debe Coder    Dietary issues and exercise activities discussed: Current Exercise Habits: The patient does not participate in regular exercise at present   Goals Addressed             This Visit's Progress    Exercise 3x per week (30 min per time)   Not on track    Increase exercise as tolerated.        Depression Screen    02/16/2022    9:03 AM 05/01/2021    9:09 AM 02/07/2021    9:02 AM 10/31/2020    8:25 AM 02/03/2020    9:36 AM 12/14/2019    1:06 PM 09/09/2018    8:46 AM  PHQ 2/9 Scores  PHQ - 2 Score 0 0 0 0 0 0 0  PHQ- 9 Score       0    Fall Risk    02/16/2022    9:00 AM 05/01/2021    9:09 AM 02/07/2021    9:06 AM 02/06/2021   12:09 PM 10/31/2020    8:26 AM  [place]da in the past year? 0 0 0 0 0  Number falls in past yr: 0  0 0   Injury with Fall? 0  0 0   Risk for fall due to : No Fall Risks No Fall Risks Impaired mobility  No Fall Risks  Follow up Falls evaluation completed;Education provided;Falls prevention discussed Falls evaluation completed Falls prevention discussed  Falls evaluation completed    FALL RISK PREVENTION PERTAINING TO THE HOME:  Any stairs in or around the home?  Yes  If so, are there any without handrails? No  Home free of loose throw rugs in walkways, pet beds, electrical cords, etc? Yes  Adequate lighting in your home to reduce risk of falls? Yes   ASSISTIVE DEVICES UTILIZED TO PREVENT FALLS:  Life alert? No  Use of a cane, walker or w/c? No  Grab bars in the bathroom? No  Shower chair or bench in shower? No  Elevated toilet seat or a handicapped  toilet? Yes   TIMED UP AND GO:  Was the test performed? No . Telephonic visit   Cognitive Function:        02/16/2022    9:11 AM 02/07/2021    9:07 AM  6CIT Screen  What Year? 0 points 0 points  What month? 0 points 0 points  What time? 0 points 0 points  Count back from 20 0 points 0 points  Months in reverse 0 points 4 points  Repeat phrase 0 points 0 points  Total Score 0 points 4 points    Immunizations Immunization History  Administered Date(s) Administered   COVID-19, mRNA, vaccine(Comirnaty)12 years and older 11/30/2021   Fluad Quad(high Dose 65+) 11/13/2020   Hepatitis A 11/27/2018   Hepatitis A, Adult 11/23/2018   Influenza Inj Mdck Quad Pf 10/22/2018   Influenza, High Dose Seasonal PF 11/17/2021   Influenza,inj,Quad PF,6+ Mos 12/30/2013, 11/01/2016   Influenza-Unspecified 12/01/2014, 11/03/2015, 12/20/2016, 10/26/2018, 11/20/2019, 11/13/2020   PFIZER(Purple Top)SARS-COV-2 Vaccination 04/30/2019, 05/23/2019, 12/29/2019, 11/24/2020   PNEUMOCOCCAL CONJUGATE-20 05/01/2021   Pfizer Covid-19 Vaccine Bivalent Booster 23yr & up 11/24/2020   Pneumococcal Conjugate-13 12/30/2013   Pneumococcal Polysaccharide-23 02/28/2016   Td 06/30/2013   Zoster Recombinat (Shingrix) 08/28/2017, 11/11/2017, 01/11/2018    TDAP status: Up to date  Flu Vaccine status: Up to date  Pneumococcal vaccine status: Up to date  Covid-19 vaccine status: Information provided on how to obtain vaccines.   Qualifies for Shingles Vaccine? Yes   Zostavax completed No   Shingrix Completed?: Yes  Screening Tests Health Maintenance  Topic Date Due   COVID-19 Vaccine (6 - 2023-24 season) 07/07/2022 (Originally 01/25/2022)   Medicare Annual Wellness (AWV)  02/17/2023   DTaP/Tdap/Td (2 - Tdap) 07/01/2023   COLONOSCOPY (Pts 45-454yrInsurance coverage will need to be confirmed)  01/24/2027   Pneumonia Vaccine 6524Years old  Completed   INFLUENZA VACCINE  Completed   Hepatitis C Screening   Completed   Zoster Vaccines- Shingrix  Completed   HPV VACCINES  Aged Out    Health Maintenance  There are no preventive care reminders to display for this patient.   Colorectal cancer screening: Type of screening: Colonoscopy. Completed 01/23/17. Repeat every 10 years  Lung Cancer Screening: (Low Dose CT Chest recommended if Age 672-80ears, 30 pack-year currently smoking OR have quit w/in 15years.) does not qualify.   Lung Cancer Screening Referral: n/a   Additional Screening:  Hepatitis C Screening: does qualify; Completed 10/21/13  Vision Screening: Recommended annual ophthalmology exams for early detection of glaucoma and other disorders of the eye. Is the patient up to date with their annual eye exam?  Yes  Who is the provider or what is the name of the office in which the patient attends annual eye exams? Dr. BrCelestia KhatIf pt is not established with a provider, would they like to be referred to a provider to establish care? No .   Dental Screening: Recommended annual dental exams for proper oral hygiene  Community Resource Referral / Chronic  Care Management: CRR required this visit?  No   CCM required this visit?  No      Plan:     I have personally reviewed and noted the following in the patient's chart:   Medical and social history Use of alcohol, tobacco or illicit drugs  Current medications and supplements including opioid prescriptions. Patient is not currently taking opioid prescriptions. Functional ability and status Nutritional status Physical activity Advanced directives List of other physicians Hospitalizations, surgeries, and ER visits in previous 12 months Vitals Screenings to include cognitive, depression, and falls Referrals and appointments  In addition, I have reviewed and discussed with patient certain preventive protocols, quality metrics, and best practice recommendations. A written personalized care plan for preventive services as well  as general preventive health recommendations were provided to patient.     Vanetta Mulders, [place]   8/89/1694   Due to this being a virtual visit, the after visit summary with patients personalized plan was offered to patient via mail or my-chart.  Patient would like to access on my-chart  Nurse Notes: No concerns

## 2022-02-15 NOTE — Patient Instructions (Addendum)
Jared Bentley , Thank you for taking time to come for your Medicare Wellness Visit. I appreciate your ongoing commitment to your health goals. Please review the following plan we discussed and let me know if I can assist you in the future.   These are the goals we discussed:  Goals      Exercise 3x per week (30 min per time)     Increase exercise as tolerated.         This is a list of the screening recommended for you and due dates:  Health Maintenance  Topic Date Due   COVID-19 Vaccine (6 - 2023-24 season) 07/07/2022*   Medicare Annual Wellness Visit  02/17/2023   DTaP/Tdap/Td vaccine (2 - Tdap) 07/01/2023   Colon Cancer Screening  01/24/2027   Pneumonia Vaccine  Completed   Flu Shot  Completed   Hepatitis C Screening: USPSTF Recommendation to screen - Ages 18-79 yo.  Completed   Zoster (Shingles) Vaccine  Completed   HPV Vaccine  Aged Out  *Topic was postponed. The date shown is not the original due date.    Advanced directives: Please bring a copy of your health care power of attorney and living will to the office to be added to your chart at your convenience.   Conditions/risks identified: Aim for 30 minutes of exercise or brisk walking, 6-8 glasses of water, and 5 servings of fruits and vegetables each day.   Next appointment: Follow up in one year for your annual wellness visit.   Preventive Care 55 Years and Older, Male  Preventive care refers to lifestyle choices and visits with your health care provider that can promote health and wellness. What does preventive care include? A yearly physical exam. This is also called an annual well check. Dental exams once or twice a year. Routine eye exams. Ask your health care provider how often you should have your eyes checked. Personal lifestyle choices, including: Daily care of your teeth and gums. Regular physical activity. Eating a healthy diet. Avoiding tobacco and drug use. Limiting alcohol use. Practicing safe  sex. Taking low doses of aspirin every day. Taking vitamin and mineral supplements as recommended by your health care provider. What happens during an annual well check? The services and screenings done by your health care provider during your annual well check will depend on your age, overall health, lifestyle risk factors, and family history of disease. Counseling  Your health care provider may ask you questions about your: Alcohol use. Tobacco use. Drug use. Emotional well-being. Home and relationship well-being. Sexual activity. Eating habits. History of falls. Memory and ability to understand (cognition). Work and work Statistician. Screening  You may have the following tests or measurements: Height, weight, and BMI. Blood pressure. Lipid and cholesterol levels. These may be checked every 5 years, or more frequently if you are over 54 years old. Skin check. Lung cancer screening. You may have this screening every year starting at age 18 if you have a 30-pack-year history of smoking and currently smoke or have quit within the past 15 years. Fecal occult blood test (FOBT) of the stool. You may have this test every year starting at age 79. Flexible sigmoidoscopy or colonoscopy. You may have a sigmoidoscopy every 5 years or a colonoscopy every 10 years starting at age 85. Prostate cancer screening. Recommendations will vary depending on your family history and other risks. Hepatitis C blood test. Hepatitis B blood test. Sexually transmitted disease (STD) testing. Diabetes screening. This is done  by checking your blood sugar (glucose) after you have not eaten for a while (fasting). You may have this done every 1-3 years. Abdominal aortic aneurysm (AAA) screening. You may need this if you are a current or former smoker. Osteoporosis. You may be screened starting at age 1 if you are at high risk. Talk with your health care provider about your test results, treatment options, and if  necessary, the need for more tests. Vaccines  Your health care provider may recommend certain vaccines, such as: Influenza vaccine. This is recommended every year. Tetanus, diphtheria, and acellular pertussis (Tdap, Td) vaccine. You may need a Td booster every 10 years. Zoster vaccine. You may need this after age 43. Pneumococcal 13-valent conjugate (PCV13) vaccine. One dose is recommended after age 49. Pneumococcal polysaccharide (PPSV23) vaccine. One dose is recommended after age 48. Talk to your health care provider about which screenings and vaccines you need and how often you need them. This information is not intended to replace advice given to you by your health care provider. Make sure you discuss any questions you have with your health care provider. Document Released: 02/18/2015 Document Revised: 10/12/2015 Document Reviewed: 11/23/2014 Elsevier Interactive Patient Education  2017 [place] Prevention in the Home Falls can cause injuries. They can happen to people of all ages. There are many things you can do to make your home safe and to help prevent falls. What can I do on the outside of my home? Regularly fix the edges of walkways and driveways and fix any cracks. Remove anything that might make you trip as you walk through a door, such as a raised step or threshold. Trim any bushes or trees on the path to your home. Use bright outdoor lighting. Clear any walking paths of anything that might make someone trip, such as rocks or tools. Regularly check to see if handrails are loose or broken. Make sure that both sides of any steps have handrails. Any raised decks and porches should have guardrails on the edges. Have any leaves, snow, or ice cleared regularly. Use sand or salt on walking paths during winter. Clean up any spills in your garage right away. This includes oil or grease spills. What can I do in the bathroom? Use night lights. Install grab bars by the toilet  and in the tub and shower. Do not use towel bars as grab bars. Use non-skid mats or decals in the tub or shower. If you need to sit down in the shower, use a plastic, non-slip stool. Keep the floor dry. Clean up any water that spills on the floor as soon as it happens. Remove soap buildup in the tub or shower regularly. Attach bath mats securely with double-sided non-slip rug tape. Do not have throw rugs and other things on the floor that can make you trip. What can I do in the bedroom? Use night lights. Make sure that you have a light by your bed that is easy to reach. Do not use any sheets or blankets that are too big for your bed. They should not hang down onto the floor. Have a firm chair that has side arms. You can use this for support while you get dressed. Do not have throw rugs and other things on the floor that can make you trip. What can I do in the kitchen? Clean up any spills right away. Avoid walking on wet floors. Keep items that you use a lot in easy-to-reach places. If you need to  reach something above you, use a strong step stool that has a grab bar. Keep electrical cords out of the way. Do not use floor polish or wax that makes floors slippery. If you must use wax, use non-skid floor wax. Do not have throw rugs and other things on the floor that can make you trip. What can I do with my stairs? Do not leave any items on the stairs. Make sure that there are handrails on both sides of the stairs and use them. Fix handrails that are broken or loose. Make sure that handrails are as long as the stairways. Check any carpeting to make sure that it is firmly attached to the stairs. Fix any carpet that is loose or worn. Avoid having throw rugs at the top or bottom of the stairs. If you do have throw rugs, attach them to the floor with carpet tape. Make sure that you have a light switch at the top of the stairs and the bottom of the stairs. If you do not have them, ask someone to add  them for you. What else can I do to help prevent falls? Wear shoes that: Do not have high heels. Have rubber bottoms. Are comfortable and fit you well. Are closed at the toe. Do not wear sandals. If you use a stepladder: Make sure that it is fully opened. Do not climb a closed stepladder. Make sure that both sides of the stepladder are locked into place. Ask someone to hold it for you, if possible. Clearly mark and make sure that you can see: Any grab bars or handrails. First and last steps. Where the edge of each step is. Use tools that help you move around (mobility aids) if they are needed. These include: Canes. Walkers. Scooters. Crutches. Turn on the lights when you go into a dark area. Replace any light bulbs as soon as they burn out. Set up your furniture so you have a clear path. Avoid moving your furniture around. If any of your floors are uneven, fix them. If there are any pets around you, be aware of where they are. Review your medicines with your doctor. Some medicines can make you feel dizzy. This can increase your chance of falling. Ask your doctor what other things that you can do to help prevent falls. This information is not intended to replace advice given to you by your health care provider. Make sure you discuss any questions you have with your health care provider. Document Released: 11/18/2008 Document Revised: 06/30/2015 Document Reviewed: 02/26/2014 Elsevier Interactive Patient Education  2017 [employer].

## 2022-02-16 ENCOUNTER — Ambulatory Visit (INDEPENDENT_AMBULATORY_CARE_PROVIDER_SITE_OTHER): Payer: Medicare HMO

## 2022-02-16 VITALS — Ht 72.0 in | Wt 220.0 lb

## 2022-02-16 DIAGNOSIS — Z0000 Encounter for general adult medical examination without abnormal findings: Secondary | ICD-10-CM | POA: Diagnosis not present

## 2022-03-23 DIAGNOSIS — M79672 Pain in left foot: Secondary | ICD-10-CM | POA: Diagnosis not present

## 2022-03-29 DIAGNOSIS — G4733 Obstructive sleep apnea (adult) (pediatric): Secondary | ICD-10-CM | POA: Diagnosis not present

## 2022-04-13 DIAGNOSIS — M79672 Pain in left foot: Secondary | ICD-10-CM | POA: Diagnosis not present

## 2022-04-19 ENCOUNTER — Other Ambulatory Visit: Payer: Self-pay | Admitting: Family Medicine

## 2022-04-27 DIAGNOSIS — G4733 Obstructive sleep apnea (adult) (pediatric): Secondary | ICD-10-CM | POA: Diagnosis not present

## 2022-05-10 ENCOUNTER — Ambulatory Visit (INDEPENDENT_AMBULATORY_CARE_PROVIDER_SITE_OTHER): Payer: Medicare HMO | Admitting: Family Medicine

## 2022-05-10 ENCOUNTER — Encounter: Payer: Self-pay | Admitting: Family Medicine

## 2022-05-10 VITALS — BP 120/70 | HR 70 | Temp 97.5°F | Ht 72.0 in | Wt 223.0 lb

## 2022-05-10 DIAGNOSIS — Z125 Encounter for screening for malignant neoplasm of prostate: Secondary | ICD-10-CM

## 2022-05-10 DIAGNOSIS — E785 Hyperlipidemia, unspecified: Secondary | ICD-10-CM | POA: Diagnosis not present

## 2022-05-10 DIAGNOSIS — K760 Fatty (change of) liver, not elsewhere classified: Secondary | ICD-10-CM

## 2022-05-10 DIAGNOSIS — N529 Male erectile dysfunction, unspecified: Secondary | ICD-10-CM | POA: Diagnosis not present

## 2022-05-10 DIAGNOSIS — E875 Hyperkalemia: Secondary | ICD-10-CM | POA: Diagnosis not present

## 2022-05-10 DIAGNOSIS — Z79899 Other long term (current) drug therapy: Secondary | ICD-10-CM

## 2022-05-10 DIAGNOSIS — Z8739 Personal history of other diseases of the musculoskeletal system and connective tissue: Secondary | ICD-10-CM | POA: Diagnosis not present

## 2022-05-10 DIAGNOSIS — I700 Atherosclerosis of aorta: Secondary | ICD-10-CM | POA: Diagnosis not present

## 2022-05-10 MED ORDER — HYDROCORTISONE 2.5 % EX OINT: TOPICAL_OINTMENT | Freq: Two times a day (BID) | CUTANEOUS | 2 refills | Status: AC

## 2022-05-10 MED ORDER — SILDENAFIL CITRATE 50 MG PO TABS: 50.0000 mg | ORAL_TABLET | Freq: Every day | ORAL | 6 refills | Status: AC | PRN

## 2022-05-10 MED ORDER — PRAVASTATIN SODIUM 10 MG PO TABS: ORAL_TABLET | ORAL | 3 refills | Status: DC

## 2022-05-10 MED ORDER — ALLOPURINOL 100 MG PO TABS: 100.0000 mg | ORAL_TABLET | Freq: Every day | ORAL | 3 refills | Status: DC

## 2022-05-10 NOTE — Progress Notes (Signed)
   Subjective:    Patient ID: Jared Bentley, male    DOB: 03-24-1955, 67 y.o.   MRN: [id number]  HPI 6 month follow up  Medication refill request for hydrocortisone ointment 2.5  And sildenafil 50 mg to [place] PSA (prostate specific antigen) - Plan: PSA  Hyperlipidemia, unspecified hyperlipidemia type - Plan: Lipid Panel  History of gout - Plan: Uric Acid  High risk medication use - Plan: Basic Metabolic Panel, Hepatic Function Panel  Fatty liver - Plan: Basic Metabolic Panel, Hepatic Function Panel  Patient presents today for follow-up.  States overall health is doing okay.  Trying to stay healthy with his eating habits.  Fitting in some activity on a regular basis.  Denies any chest pain shortness of breath.  Takes his medication on a regular basis. He has a history of fatty liver. History of hyperlipidemia takes his medicine Patient here for follow-up regarding cholesterol.    Patient relates taking medication on a regular basis Denies problems with medication Importance of dietary measures discussed Regular lab work regarding lipid and liver was checked and if needing additional labs was appropriately ordered  Review of Systems     Objective:   Physical Exam General-in no acute distress Eyes-no discharge Lungs-respiratory rate normal, CTA CV-no murmurs,RRR Extremities skin warm dry no edema Neuro grossly normal Behavior normal, alert        Assessment & Plan:  1. Screening PSA (prostate specific antigen) Screening - PSA  2. Hyperlipidemia, unspecified hyperlipidemia type Lipid profile, continue medication, tolerated well - Lipid Panel  3. History of gout Continue medication healthy diet no flareups recently - Uric Acid  4. High risk medication use Check lab work - Basic Metabolic Panel - Hepatic Function Panel  5. Fatty liver Healthy diet, regular activity trying to keep weight and check, check lab work - Basic Metabolic Panel -  Hepatic Function Panel  6. Aortic atherosclerosis Continue statin healthy diet recommended  7. Erectile dysfunction, unspecified erectile dysfunction type Sildenafil as needed  Follow-up by fall time

## 2022-05-11 LAB — HEPATIC FUNCTION PANEL
ALT: 13 IU/L (ref 0–44)
AST: 17 IU/L (ref 0–40)
Albumin: 4.7 g/dL (ref 3.9–4.9)
Alkaline Phosphatase: 80 IU/L (ref 44–121)
Bilirubin Total: 0.6 mg/dL (ref 0.0–1.2)
Bilirubin, Direct: 0.17 mg/dL (ref 0.00–0.40)
Total Protein: 7 g/dL (ref 6.0–8.5)

## 2022-05-11 LAB — LIPID PANEL
Chol/HDL Ratio: 2.5 ratio (ref 0.0–5.0)
Cholesterol, Total: 180 mg/dL (ref 100–199)
HDL: 72 mg/dL (ref >39)
LDL Chol Calc (NIH): 98 mg/dL (ref 0–99)
Triglycerides: 48 mg/dL (ref 0–149)
VLDL Cholesterol Cal: 10 mg/dL (ref 5–40)

## 2022-05-11 LAB — BASIC METABOLIC PANEL
BUN/Creatinine Ratio: 15 (ref 10–24)
BUN: 17 mg/dL (ref 8–27)
CO2: 26 mmol/L (ref 20–29)
Calcium: 10.3 mg/dL — ABNORMAL HIGH (ref 8.6–10.2)
Chloride: 101 mmol/L (ref 96–106)
Creatinine, Ser: 1.12 mg/dL (ref 0.76–1.27)
Glucose: 100 mg/dL — ABNORMAL HIGH (ref 70–99)
Potassium: 5.4 mmol/L — ABNORMAL HIGH (ref 3.5–5.2)
Sodium: 140 mmol/L (ref 134–144)
eGFR: 72 mL/min/{1.73_m2} (ref >59)

## 2022-05-11 LAB — URIC ACID: Uric Acid: 5.5 mg/dL (ref 3.8–8.4)

## 2022-05-11 LAB — PSA: Prostate Specific Ag, Serum: 0.9 ng/mL (ref 0.0–4.0)

## 2022-05-14 NOTE — Addendum Note (Signed)
Addended by: Margaretha Sheffield on: 05/14/2022 08:55 AM   Modules accepted: Orders

## 2022-05-28 DIAGNOSIS — G4733 Obstructive sleep apnea (adult) (pediatric): Secondary | ICD-10-CM | POA: Diagnosis not present

## 2022-06-19 DIAGNOSIS — E875 Hyperkalemia: Secondary | ICD-10-CM | POA: Diagnosis not present

## 2022-06-20 LAB — BASIC METABOLIC PANEL
BUN/Creatinine Ratio: 23 (ref 10–24)
BUN: 21 mg/dL (ref 8–27)
CO2: 25 mmol/L (ref 20–29)
Calcium: 9.7 mg/dL (ref 8.6–10.2)
Chloride: 103 mmol/L (ref 96–106)
Creatinine, Ser: 0.9 mg/dL (ref 0.76–1.27)
Glucose: 99 mg/dL (ref 70–99)
Potassium: 4.6 mmol/L (ref 3.5–5.2)
Sodium: 141 mmol/L (ref 134–144)
eGFR: 94 mL/min/{1.73_m2} (ref >59)

## 2022-06-27 DIAGNOSIS — G4733 Obstructive sleep apnea (adult) (pediatric): Secondary | ICD-10-CM | POA: Diagnosis not present

## 2022-07-06 ENCOUNTER — Other Ambulatory Visit: Payer: Self-pay | Admitting: Family Medicine

## 2022-07-28 DIAGNOSIS — G4733 Obstructive sleep apnea (adult) (pediatric): Secondary | ICD-10-CM | POA: Diagnosis not present

## 2022-08-27 DIAGNOSIS — G4733 Obstructive sleep apnea (adult) (pediatric): Secondary | ICD-10-CM | POA: Diagnosis not present

## 2022-09-21 ENCOUNTER — Other Ambulatory Visit: Payer: Self-pay | Admitting: Family Medicine

## 2022-09-25 DIAGNOSIS — G4733 Obstructive sleep apnea (adult) (pediatric): Secondary | ICD-10-CM | POA: Diagnosis not present

## 2022-10-24 DIAGNOSIS — L409 Psoriasis, unspecified: Secondary | ICD-10-CM | POA: Diagnosis not present

## 2022-10-24 DIAGNOSIS — L820 Inflamed seborrheic keratosis: Secondary | ICD-10-CM | POA: Diagnosis not present

## 2022-10-24 DIAGNOSIS — L280 Lichen simplex chronicus: Secondary | ICD-10-CM | POA: Diagnosis not present

## 2022-10-24 DIAGNOSIS — L570 Actinic keratosis: Secondary | ICD-10-CM | POA: Diagnosis not present

## 2022-10-24 DIAGNOSIS — D235 Other benign neoplasm of skin of trunk: Secondary | ICD-10-CM | POA: Diagnosis not present

## 2022-10-24 DIAGNOSIS — D485 Neoplasm of uncertain behavior of skin: Secondary | ICD-10-CM | POA: Diagnosis not present

## 2022-10-26 DIAGNOSIS — G4733 Obstructive sleep apnea (adult) (pediatric): Secondary | ICD-10-CM | POA: Diagnosis not present

## 2022-10-29 DIAGNOSIS — H52223 Regular astigmatism, bilateral: Secondary | ICD-10-CM | POA: Diagnosis not present

## 2022-10-29 DIAGNOSIS — H2513 Age-related nuclear cataract, bilateral: Secondary | ICD-10-CM | POA: Diagnosis not present

## 2022-10-29 DIAGNOSIS — H524 Presbyopia: Secondary | ICD-10-CM | POA: Diagnosis not present

## 2022-10-29 DIAGNOSIS — H35363 Drusen (degenerative) of macula, bilateral: Secondary | ICD-10-CM | POA: Diagnosis not present

## 2022-10-29 DIAGNOSIS — H5203 Hypermetropia, bilateral: Secondary | ICD-10-CM | POA: Diagnosis not present

## 2022-11-08 ENCOUNTER — Ambulatory Visit: Payer: Medicare HMO | Admitting: Family Medicine

## 2022-11-13 ENCOUNTER — Encounter: Payer: Self-pay | Admitting: Family Medicine

## 2022-11-13 ENCOUNTER — Ambulatory Visit (INDEPENDENT_AMBULATORY_CARE_PROVIDER_SITE_OTHER): Payer: Medicare HMO | Admitting: Family Medicine

## 2022-11-13 VITALS — BP 112/68 | HR 72 | Temp 98.6°F | Ht 72.0 in | Wt 223.2 lb

## 2022-11-13 DIAGNOSIS — M1811 Unilateral primary osteoarthritis of first carpometacarpal joint, right hand: Secondary | ICD-10-CM | POA: Diagnosis not present

## 2022-11-13 DIAGNOSIS — Z125 Encounter for screening for malignant neoplasm of prostate: Secondary | ICD-10-CM

## 2022-11-13 DIAGNOSIS — Z79899 Other long term (current) drug therapy: Secondary | ICD-10-CM

## 2022-11-13 DIAGNOSIS — E785 Hyperlipidemia, unspecified: Secondary | ICD-10-CM

## 2022-11-13 DIAGNOSIS — Z8739 Personal history of other diseases of the musculoskeletal system and connective tissue: Secondary | ICD-10-CM | POA: Diagnosis not present

## 2022-11-13 NOTE — Progress Notes (Signed)
Subjective:    Patient ID: Jared Bentley, male    DOB: April 25, 1955, 67 y.o.   MRN: [id number]  Discussed the use of AI scribe software for clinical note transcription with the patient, who gave verbal consent to proceed.  History of Present Illness   The patient presents with persistent discomfort in the right wrist and thumb, which has been progressively worsening. He reports a lack of strength in the thumb and pain in the wrist, particularly when applying pressure. The patient has a history of carpal tunnel syndrome, for which he underwent surgery years ago. He was informed at the time that he might experience pain as he aged, particularly in cold weather. The patient denies any numbness in the hands, a symptom he experienced prior to the carpal tunnel surgery.  The patient manages the discomfort with occasional use of ibuprofen, taking one 200mg  tablet approximately twice a week. He reports that this provides relief for about three days. The patient notes that the pain intensifies when he is working in his shop, particularly when turning bolts.  In addition to the wrist and thumb discomfort, the patient has been taking allopurinol for gout prevention and reports no recent flare-ups. He also takes cholesterol medication twice a week, which he believes may have contributed to his wrist pain, although he is uncertain. The patient has considered increasing the frequency of the cholesterol medication but has not yet done so.  The patient reports regular bowel movements and normal urinary flow. He denies any breathing difficulties and reports maintaining a generally good mood. He uses a CPAP machine for sleep, which he finds helpful. The patient has lost significant weight in the past, which he believes has positively impacted his health, including blood pressure regulation. He quit smoking 17 years ago.  The patient's primary concern is the discomfort in his wrist and thumb, which he believes is due to  osteoarthritis. He is considering an x-ray to confirm this but is currently managing the pain with ibuprofen. He expresses a willingness to explore more advanced treatment options if the pain becomes unmanageable.         Review of Systems     Objective:    Physical Exam   VITALS: BP- 112/68 CHEST: Lungs clear to auscultation bilaterally. CARDIOVASCULAR: Heart sounds normal on auscultation. MUSCULOSKELETAL: Thumb with palpable knot at base, suggestive of osteoarthritis.           Assessment & Plan:  Assessment and Plan    Osteoarthritis of the wrist and thumb Pain and weakness in the right wrist and thumb, likely due to advanced osteoarthritis. No current desire for further intervention. -Continue ibuprofen 200mg  as needed for pain, up to twice weekly. -Consider x-ray if symptoms worsen or become intolerable.  Hyperlipidemia Well-managed with cholesterol medication twice weekly. -Continue current regimen of cholesterol medication. -Plan to recheck cholesterol levels in the spring.  Gout No recent flare-ups, currently on allopurinol for prevention. -Continue allopurinol as prescribed.  General Health Maintenance -Continue current medications, including sildenafil. -Plan for full panel of blood work in the spring. -Encouraged to reach out if any new issues arise.      1. Hyperlipidemia, unspecified hyperlipidemia type Continue the medication He takes it 2 days a week He feels that it could contribute to his osteoarthritis at the base of his right thumb but I told him that this is unlikely If he wanted to stop the medicine for 2 months to see if the pain goes away he could but  in my opinion it is not linked  2. Primary osteoarthritis of first carpometacarpal joint of right hand Intermittent use of ibuprofen as discussed above is fine If he was interested in x-ray as well as referral to hand specialist we can do so In some situations surgeries could be  helpful  Comprehensive lab work and follow-up visit in 6 months

## 2022-11-25 DIAGNOSIS — G4733 Obstructive sleep apnea (adult) (pediatric): Secondary | ICD-10-CM | POA: Diagnosis not present

## 2022-11-26 ENCOUNTER — Other Ambulatory Visit: Payer: Self-pay

## 2022-11-26 DIAGNOSIS — Z79899 Other long term (current) drug therapy: Secondary | ICD-10-CM

## 2022-11-26 DIAGNOSIS — I10 Essential (primary) hypertension: Secondary | ICD-10-CM

## 2022-11-26 DIAGNOSIS — E785 Hyperlipidemia, unspecified: Secondary | ICD-10-CM

## 2022-11-26 DIAGNOSIS — Z125 Encounter for screening for malignant neoplasm of prostate: Secondary | ICD-10-CM

## 2022-12-24 DIAGNOSIS — G4733 Obstructive sleep apnea (adult) (pediatric): Secondary | ICD-10-CM | POA: Diagnosis not present

## 2023-01-23 DIAGNOSIS — E785 Hyperlipidemia, unspecified: Secondary | ICD-10-CM | POA: Diagnosis not present

## 2023-01-23 DIAGNOSIS — I10 Essential (primary) hypertension: Secondary | ICD-10-CM | POA: Diagnosis not present

## 2023-01-23 DIAGNOSIS — Z125 Encounter for screening for malignant neoplasm of prostate: Secondary | ICD-10-CM | POA: Diagnosis not present

## 2023-01-23 DIAGNOSIS — Z79899 Other long term (current) drug therapy: Secondary | ICD-10-CM | POA: Diagnosis not present

## 2023-01-24 LAB — BASIC METABOLIC PANEL
BUN/Creatinine Ratio: 18 (ref 10–24)
BUN: 18 mg/dL (ref 8–27)
CO2: 26 mmol/L (ref 20–29)
Calcium: 9.9 mg/dL (ref 8.6–10.2)
Chloride: 101 mmol/L (ref 96–106)
Creatinine, Ser: 1.01 mg/dL (ref 0.76–1.27)
Glucose: 101 mg/dL — ABNORMAL HIGH (ref 70–99)
Potassium: 4.2 mmol/L (ref 3.5–5.2)
Sodium: 141 mmol/L (ref 134–144)
eGFR: 81 mL/min/{1.73_m2} (ref >59)

## 2023-01-24 LAB — LIPID PANEL
Chol/HDL Ratio: 2.5 {ratio} (ref 0.0–5.0)
Cholesterol, Total: 167 mg/dL (ref 100–199)
HDL: 66 mg/dL (ref >39)
LDL Chol Calc (NIH): 90 mg/dL (ref 0–99)
Triglycerides: 56 mg/dL (ref 0–149)
VLDL Cholesterol Cal: 11 mg/dL (ref 5–40)

## 2023-01-24 LAB — HEPATIC FUNCTION PANEL
ALT: 11 [IU]/L (ref 0–44)
AST: 14 [IU]/L (ref 0–40)
Albumin: 4.3 g/dL (ref 3.9–4.9)
Alkaline Phosphatase: 80 [IU]/L (ref 44–121)
Bilirubin Total: 0.3 mg/dL (ref 0.0–1.2)
Bilirubin, Direct: 0.13 mg/dL (ref 0.00–0.40)
Total Protein: 6.7 g/dL (ref 6.0–8.5)

## 2023-01-24 LAB — MICROALBUMIN / CREATININE URINE RATIO
Creatinine, Urine: 92.3 mg/dL
Microalb/Creat Ratio: 5 mg/g{creat} (ref 0–29)
Microalbumin, Urine: 4.6 ug/mL

## 2023-01-24 LAB — PSA: Prostate Specific Ag, Serum: 0.8 ng/mL (ref 0.0–4.0)

## 2023-02-22 ENCOUNTER — Ambulatory Visit (INDEPENDENT_AMBULATORY_CARE_PROVIDER_SITE_OTHER): Payer: Medicare HMO

## 2023-02-22 VITALS — BP 120/70 | Ht 72.0 in | Wt 215.0 lb

## 2023-02-22 DIAGNOSIS — Z0000 Encounter for general adult medical examination without abnormal findings: Secondary | ICD-10-CM | POA: Diagnosis not present

## 2023-02-22 DIAGNOSIS — Z5320 Procedure and treatment not carried out because of patient's decision for unspecified reasons: Secondary | ICD-10-CM

## 2023-02-22 NOTE — Progress Notes (Signed)
Please attest and cosign this visit due to patients primary care provider not being in the office at the time the visit was completed.  Because this visit was a virtual/telehealth visit,  certain criteria was not obtained, such a blood pressure, CBG if applicable, and timed get up and go. Any medications not marked as "taking" were not mentioned during the medication reconciliation part of the visit. Any vitals not documented were not able to be obtained due to this being a telehealth visit or patient was unable to self-report a recent blood pressure reading due to a lack of equipment at home via telehealth. Vitals that have been documented are verbally provided by the patient.  Interactive audio and video telecommunications were attempted between this provider and patient, however failed, due to patient having technical difficulties OR patient did not have access to video capability.  We continued and completed visit with audio only.  Subjective:   Jared Bentley is a 68 y.o. male who presents for Medicare Annual/Subsequent preventive examination.  Visit Complete: Virtual I connected with  Jared Bentley on 02/22/23 by a audio enabled telemedicine application and verified that I am speaking with the correct person using two identifiers.  Patient Location: Home  Provider Location: Office/Clinic  I discussed the limitations of evaluation and management by telemedicine. The patient expressed understanding and agreed to proceed.  Vital Signs: Because this visit was a virtual/telehealth visit, some criteria may be missing or patient reported. Any vitals not documented were not able to be obtained and vitals that have been documented are patient reported.  Patient Medicare AWV questionnaire was completed by the patient on na; I have confirmed that all information answered by patient is correct and no changes since this date.  Cardiac Risk Factors include: advanced age (>64men, >40  women);hypertension;male gender;sedentary lifestyle     Objective:    Today's Vitals   02/22/23 1317  BP: 120/70  Weight: 215 lb (97.5 kg)  Height: 6' (1.829 m)   Body mass index is 29.16 kg/m.     02/22/2023    1:22 PM 02/16/2022    9:10 AM 02/07/2021    9:05 AM 01/23/2017   10:12 AM 01/17/2017   10:05 AM 10/31/2015   10:32 AM 04/28/2015    8:31 AM  Advanced Directives  Does Patient Have a Medical Advance Directive? No Yes Yes No;Yes No Yes Yes  Type of Advance Directive  Living will;Healthcare Power of [employer] Power of [place];Living will Living will  Healthcare Power of [place];Living will Healthcare Power of [place];Living will  Does patient want to make changes to medical advance directive?  No - Patient declined    No - Patient declined No - Patient declined  Copy of Healthcare Power of Attorney in Chart?  No - copy requested No - copy requested   No - copy requested No - copy requested  Would patient like information on creating a medical advance directive? No - Patient declined  No - Patient declined  No - Patient declined      Current Medications (verified) Outpatient Encounter Medications as of 02/22/2023  Medication Sig   allopurinol (ZYLOPRIM) 100 MG tablet Take 1 tablet (100 mg total) by mouth daily.   colchicine 0.6 MG tablet 2 pills now then 1 bid prn for gout flare up   hydrocortisone 2.5 % ointment Apply topically 2 (two) times daily.   ibuprofen (ADVIL) 200 MG tablet Take 200 mg by mouth every 6 (six) hours as needed.  pravastatin (PRAVACHOL) 10 MG tablet TAKE 1 TABLET BY MOUTH ON MONDAY AND FRIDAY   sildenafil (VIAGRA) 50 MG tablet Take 1 tablet (50 mg total) by mouth daily as needed for erectile dysfunction.   No facility-administered encounter medications on file as of 02/22/2023.    Allergies (verified) Patient has no known allergies.   History: Past Medical History:  Diagnosis Date   Anxiety    Carpal tunnel syndrome    Chronic back  pain    Diffuse large B cell lymphoma (HCC) 12/30/2013   ED (erectile dysfunction)    Glucose intolerance (impaired glucose tolerance)    Heavy alcohol consumption    6-12 beers daily   Hypertension    Sleep apnea    severe OSA-uses a cpap   Past Surgical History:  Procedure Laterality Date   CARPAL TUNNEL RELEASE Left 07/29/2014   Procedure: CARPAL TUNNEL RELEASE;  Surgeon: Darreld Mclean, MD;  Location: [facility];  Service: Orthopedics;  Laterality: Left;   CARPAL TUNNEL RELEASE Right 11/16/2014   Procedure: CARPAL TUNNEL RELEASE;  Surgeon: Darreld Mclean, MD;  Location: [facility];  Service: Orthopedics;  Laterality: Right;   COLONOSCOPY W/ POLYPECTOMY     COLONOSCOPY WITH PROPOFOL N/A 01/23/2017   Dr. Jena Gauss: normal colon.  Next colonoscopy in 10 years   KNEE SURGERY Left    approx 2020   LYMPH NODE BIOPSY Left 12/22/2013   Procedure: LEFT INGUINAL LYMPH NODE BIOPSY;  Surgeon: Almond Lint, MD;  Location: [facility];  Service: General;  Laterality: Left;   PORT-A-CATH REMOVAL N/A 07/08/2014   Procedure: REMOVAL PORT-A-CATH;  Surgeon: Almond Lint, MD;  Location: [facility];  Service: General;  Laterality: N/A;   PORTACATH PLACEMENT N/A 01/06/2014   Procedure: INSERTION PORT-A-CATH;  Surgeon: Almond Lint, MD;  Location: [facility];  Service: General;  Laterality: N/A;   VASECTOMY     WISDOM TOOTH EXTRACTION     Family History  Problem Relation Age of Onset   Cancer Father        bone   Cancer Mother        tongue ca   Colon cancer Neg Hx    Social History   Socioeconomic History   Marital status: Married    Spouse name: Stanton Kidney   Number of children: 1   Years of education: Not on file   Highest education level: Not on file  Occupational History   Not on file  Tobacco Use   Smoking status: Former    Current packs/day: 0.00    Average packs/day: 1 pack/day for 30.0 years (30.0 ttl pk-yrs)    Types: Cigarettes    Start date: 05/15/1974    Quit date:  05/14/2004    Years since quitting: 18.7   Smokeless tobacco: Never  Vaping Use   Vaping status: Never Used  Substance and Sexual Activity   Alcohol use: Yes    Comment: daily-6-12 beer; 10/29/18 "2 beers/day as of 06/04/19   Drug use: No   Sexual activity: Yes    Birth control/protection: None  Other Topics Concern   Not on file  Social History Narrative   Retired.  [place]     1 daughter   Married x 48 years in 2022.   Social Drivers of Corporate investment banker Strain: Low Risk  (02/22/2023)   Overall Financial Resource Strain (CARDIA)    Difficulty of Paying Living Expenses: Not hard at all  Food Insecurity: No Food Insecurity (  02/22/2023)   Hunger Vital Sign    Worried About Running Out of Food in the Last Year: Never true    Ran Out of Food in the Last Year: Never true  Transportation Needs: No Transportation Needs (02/22/2023)   PRAPARE - Administrator, Civil Service (Medical): No    Lack of Transportation (Non-Medical): No  Physical Activity: Inactive (02/22/2023)   Exercise Vital Sign    Days of Exercise per Week: 0 days    Minutes of Exercise per Session: 0 min  Stress: No Stress Concern Present (02/22/2023)   [employer] of Occupational Health - Occupational Stress Questionnaire    Feeling of Stress : Not at all  Social Connections: Moderately Isolated (02/22/2023)   Social Connection and Isolation Panel [NHANES]    Frequency of Communication with Friends and Family: More than three times a week    Frequency of Social Gatherings with Friends and Family: More than three times a week    Attends Religious Services: Never    Database administrator or Organizations: No    Attends Engineer, structural: Never    Marital Status: Married    Tobacco Counseling Counseling given: Yes   Clinical Intake:  Pre-visit preparation completed: Yes  Pain : No/denies pain     BMI - recorded: 29.16 Nutritional Risks: None Diabetes:  No  How often do you need to have someone help you when you read instructions, pamphlets, or other written materials from your doctor or pharmacy?: 1 - Never  Interpreter Needed?: No  Information entered by :: Maryjean Ka CMA   Activities of Daily Living    02/22/2023    1:19 PM  In your present state of health, do you have any difficulty performing the following activities:  Hearing? 0  Vision? 0  Difficulty concentrating or making decisions? 0  Walking or climbing stairs? 0  Dressing or bathing? 0  Doing errands, shopping? 0  Preparing Food and eating ? N  Using the Toilet? N  In the past six months, have you accidently leaked urine? N  Do you have problems with loss of bowel control? N  Managing your Medications? N  Managing your Finances? N  Housekeeping or managing your Housekeeping? N    Patient Care Team: Babs Sciara, MD as PCP - General (Family Medicine) Pricilla Riffle, MD as Consulting Physician (Cardiology) Cira Rue, RN as Registered Nurse Delora Fuel, OD (Optometry) Marcelino Duster, MD as Referring Physician (Dermatology) Plocki, Harriet Pho, PA-C (Orthopedic Surgery)  Indicate any recent Medical Services you may have received from other than [facility] providers in the past year (date may be approximate).     Assessment:   This is a routine wellness examination for Jared Bentley.  Hearing/Vision screen Hearing Screening - Comments:: Patient denies any hearing difficulties.   Vision Screening - Comments:: Patient is up to date with yearly eye exams and sees [facility] [place]   Goals Addressed             This Visit's Progress    Patient Stated       Remain active and healthy       Depression Screen    02/22/2023    1:23 PM 11/13/2022    8:41 AM 05/10/2022    9:01 AM 02/16/2022    9:03 AM 05/01/2021    9:09 AM 02/07/2021    9:02 AM 10/31/2020    8:25 AM  PHQ 2/9 Scores  PHQ -  2 Score 0 0 0 0 0 0 0  PHQ- 9 Score 0 0 0        Fall Risk     02/22/2023    1:23 PM 11/13/2022    8:41 AM 05/10/2022    9:01 AM 02/16/2022    9:00 AM 05/01/2021    9:09 AM  Fall Risk   Falls in the past year? 0 0 0 0 0  Number falls in past yr: 0 0 0 0   Injury with Fall? 0 0 0 0   Risk for fall due to : No Fall Risks  No Fall Risks No Fall Risks No Fall Risks  Follow up Falls prevention discussed  Falls evaluation completed Falls evaluation completed;Education provided;Falls prevention discussed Falls evaluation completed    MEDICARE RISK AT HOME: Medicare Risk at Home Any stairs in or around the home?: No If so, are there any without handrails?: No Home free of loose throw rugs in walkways, pet beds, electrical cords, etc?: Yes Adequate lighting in your home to reduce risk of falls?: Yes Life alert?: No Use of a cane, walker or w/c?: No Grab bars in the bathroom?: No Shower chair or bench in shower?: No Elevated toilet seat or a handicapped toilet?: No  TIMED UP AND GO:  Was the test performed?  No    Cognitive Function:        02/22/2023    1:23 PM 02/16/2022    9:11 AM 02/07/2021    9:07 AM  6CIT Screen  What Year? 0 points 0 points 0 points  What month? 0 points 0 points 0 points  What time? 0 points 0 points 0 points  Count back from 20 0 points 0 points 0 points  Months in reverse 0 points 0 points 4 points  Repeat phrase 0 points 0 points 0 points  Total Score 0 points 0 points 4 points    Immunizations Immunization History  Administered Date(s) Administered   Fluad Quad(high Dose 65+) 11/13/2020   Hepatitis A 11/27/2018   Hepatitis A, Adult 11/23/2018   Influenza Inj Mdck Quad Pf 10/22/2018   Influenza, High Dose Seasonal PF 11/17/2021, 11/20/2022   Influenza,inj,Quad PF,6+ Mos 12/30/2013, 11/01/2016   Influenza-Unspecified 12/01/2014, 11/03/2015, 12/20/2016, 10/26/2018, 11/20/2019, 11/13/2020   PFIZER(Purple Top)SARS-COV-2 Vaccination 04/30/2019, 05/23/2019, 12/29/2019, 11/24/2020   PNEUMOCOCCAL CONJUGATE-20 05/01/2021    Pfizer Covid-19 Vaccine Bivalent Booster 31yrs & up 11/24/2020   Pfizer(Comirnaty)Fall Seasonal Vaccine 12 years and older 11/30/2021, 11/09/2022   Pneumococcal Conjugate-13 12/30/2013   Pneumococcal Polysaccharide-23 02/28/2016   Td 06/30/2013   Zoster Recombinant(Shingrix) 08/28/2017, 11/11/2017, 01/11/2018    TDAP status: Up to date  Flu Vaccine status: Up to date  Pneumococcal vaccine status: Up to date  Covid-19 vaccine status: Information provided on how to obtain vaccines.   Qualifies for Shingles Vaccine? No   Zostavax completed No   Shingrix Completed?: Yes  Screening Tests Health Maintenance  Topic Date Due   COVID-19 Vaccine (7 - 2024-25 season) 01/04/2023   Medicare Annual Wellness (AWV)  02/17/2023   Lung Cancer Screening  Never done   DTaP/Tdap/Td (2 - Tdap) 07/01/2023   Colonoscopy  01/24/2027   Pneumonia Vaccine 25+ Years old  Completed   INFLUENZA VACCINE  Completed   Hepatitis C Screening  Completed   Zoster Vaccines- Shingrix  Completed   HPV VACCINES  Aged Out    Health Maintenance  Health Maintenance Due  Topic Date Due   COVID-19 Vaccine (7 - 2024-25 season)  01/04/2023   Medicare Annual Wellness (AWV)  02/17/2023   Lung Cancer Screening  Never done    Colorectal cancer screening: Type of screening: Colonoscopy. Completed 01/23/2017. Repeat every 10 years  Lung Cancer Screening: (Low Dose CT Chest recommended if Age 60-80 years, 20 pack-year currently smoking OR have quit w/in 15years.) does qualify.   Lung Cancer Screening Referral: patient declined referral  Additional Screening:  Hepatitis C Screening: does not qualify; Completed   Vision Screening: Recommended annual ophthalmology exams for early detection of glaucoma and other disorders of the eye. Is the patient up to date with their annual eye exam?  Yes  Who is the provider or what is the name of the office in which the patient attends annual eye exams? [facility] [place] If  pt is not established with a provider, would they like to be referred to a provider to establish care? No .   Dental Screening: Recommended annual dental exams for proper oral hygiene  Diabetic Foot Exam: na  Community Resource Referral / Chronic Care Management: CRR required this visit?  No   CCM required this visit?  No     Plan:     I have personally reviewed and noted the following in the patient's chart:   Medical and social history Use of alcohol, tobacco or illicit drugs  Current medications and supplements including opioid prescriptions. Patient is not currently taking opioid prescriptions. Functional ability and status Nutritional status Physical activity Advanced directives List of other physicians Hospitalizations, surgeries, and ER visits in previous 12 months Vitals Screenings to include cognitive, depression, and falls Referrals and appointments  In addition, I have reviewed and discussed with patient certain preventive protocols, quality metrics, and best practice recommendations. A written personalized care plan for preventive services as well as general preventive health recommendations were provided to patient.     Jordan Hawks Pier Laux, CMA   02/22/2023   After Visit Summary: (MyChart) Due to this being a telephonic visit, the after visit summary with patients personalized plan was offered to patient via MyChart   Nurse Notes: see routing comment

## 2023-02-22 NOTE — Patient Instructions (Signed)
Jared Bentley , Thank you for taking time to come for your Medicare Wellness Visit. I appreciate your ongoing commitment to your health goals. Please review the following plan we discussed and let me know if I can assist you in the future.   Referrals/Orders/Follow-Ups/Clinician Recommendations:  Next Medicare Annual Wellness Visit:February 28, 2024 at 1:00 pm virtual  You are eligible for a lung cancer screening due to your smoking history. You declined a referral to see pulmonary for your scan. If you change your mind please call the office so that the referral can be placed for you.       This is a list of the screening recommended for you and due dates:  Health Maintenance  Topic Date Due   COVID-19 Vaccine (7 - 2024-25 season) 01/04/2023   Screening for Lung Cancer  Never done   DTaP/Tdap/Td vaccine (2 - Tdap) 07/01/2023   Medicare Annual Wellness Visit  02/22/2024   Colon Cancer Screening  01/24/2027   Pneumonia Vaccine  Completed   Flu Shot  Completed   Hepatitis C Screening  Completed   Zoster (Shingles) Vaccine  Completed   HPV Vaccine  Aged Out    Advanced directives: (Declined) Advance directive discussed with you today. Even though you declined this today, please call our office should you change your mind, and we can give you the proper paperwork for you to fill out.  Next Medicare Annual Wellness Visit scheduled for next year: yes  Preventive Care 31 Years and Older, Male Preventive care refers to lifestyle choices and visits with your health care provider that can promote health and wellness. Preventive care visits are also called wellness exams. What can I expect for my preventive care visit? Counseling During your preventive care visit, your health care provider may ask about your: Medical history, including: Past medical problems. Family medical history. History of falls. Current health, including: Emotional well-being. Home life and relationship  well-being. Sexual activity. Memory and ability to understand (cognition). Lifestyle, including: Alcohol, nicotine or tobacco, and drug use. Access to firearms. Diet, exercise, and sleep habits. Work and work Astronomer. Sunscreen use. Safety issues such as seatbelt and bike helmet use. Physical exam Your health care provider will check your: Height and weight. These may be used to calculate your BMI (body mass index). BMI is a measurement that tells if you are at a healthy weight. Waist circumference. This measures the distance around your waistline. This measurement also tells if you are at a healthy weight and may help predict your risk of certain diseases, such as type 2 diabetes and high blood pressure. Heart rate and blood pressure. Body temperature. Skin for abnormal spots. What immunizations do I need?  Vaccines are usually given at various ages, according to a schedule. Your health care provider will recommend vaccines for you based on your age, medical history, and lifestyle or other factors, such as travel or where you work. What tests do I need? Screening Your health care provider may recommend screening tests for certain conditions. This may include: Lipid and cholesterol levels. Diabetes screening. This is done by checking your blood sugar (glucose) after you have not eaten for a while (fasting). Hepatitis C test. Hepatitis B test. HIV (human immunodeficiency virus) test. STI (sexually transmitted infection) testing, if you are at risk. Lung cancer screening. Colorectal cancer screening. Prostate cancer screening. Abdominal aortic aneurysm (AAA) screening. You may need this if you are a current or former smoker. Talk with your health care provider about  your test results, treatment options, and if necessary, the need for more tests. Follow these instructions at home: Eating and drinking  Eat a diet that includes fresh fruits and vegetables, whole grains, lean  protein, and low-fat dairy products. Limit your intake of foods with high amounts of sugar, saturated fats, and salt. Take vitamin and mineral supplements as recommended by your health care provider. Do not drink alcohol if your health care provider tells you not to drink. If you drink alcohol: Limit how much you have to 0-2 drinks a day. Know how much alcohol is in your drink. In the [place] one drink equals one 12 oz bottle of beer (355 mL), one 5 oz glass of wine (148 mL), or one 1 oz glass of hard liquor (44 mL). Lifestyle Brush your teeth every morning and night with fluoride toothpaste. Floss one time each day. Exercise for at least 30 minutes 5 or more days each week. Do not use any products that contain nicotine or tobacco. These products include cigarettes, chewing tobacco, and vaping devices, such as e-cigarettes. If you need help quitting, ask your health care provider. Do not use drugs. If you are sexually active, practice safe sex. Use a condom or other form of protection to prevent STIs. Take aspirin only as told by your health care provider. Make sure that you understand how much to take and what form to take. Work with your health care provider to find out whether it is safe and beneficial for you to take aspirin daily. Ask your health care provider if you need to take a cholesterol-lowering medicine (statin). Find healthy ways to manage stress, such as: Meditation, yoga, or listening to music. Journaling. Talking to a trusted person. Spending time with friends and family. Safety Always wear your seat belt while driving or riding in a vehicle. Do not drive: If you have been drinking alcohol. Do not ride with someone who has been drinking. When you are tired or distracted. While texting. If you have been using any mind-altering substances or drugs. Wear a helmet and other protective equipment during sports activities. If you have firearms in your house, make sure you follow  all gun safety procedures. Minimize exposure to UV radiation to reduce your risk of skin cancer. What's next? Visit your health care provider once a year for an annual wellness visit. Ask your health care provider how often you should have your eyes and teeth checked. Stay up to date on all vaccines. This information is not intended to replace advice given to you by your health care provider. Make sure you discuss any questions you have with your health care provider. Document Revised: 07/20/2020 Document Reviewed: 07/20/2020 Elsevier Patient Education  2024 Elsevier Inc.Lung Cancer Screening A lung cancer screening is a test that checks for lung cancer when there are no symptoms or history of that disease. The screening is done to look for lung cancer in its very early stages. Finding cancer early improves the chances of successful treatment. It may save your life. Who should have a screening? You should be screened for lung cancer if all of these apply: You currently smoke or you used to smoke. You are between the ages of 52 and 13 years old. Screening may be recommended up to age 30 depending on your overall health and other factors. You have a smoking history of 1 pack of cigarettes a day for 20 years or 2 packs a day for 10 years. How is screening done?  The recommended screening test is a low-dose computed tomography (LDCT) scan. This scan takes detailed images of the lungs. This allows a health care provider to look for abnormal cells. If you are at risk for lung cancer, it is recommended that you get screened once a year. Talk to your health care provider about the risks, benefits, and limitations of screening. What are the benefits of screening? Screening can find lung cancer early, before symptoms start and before it has spread outside of the lungs. The chances of curing lung cancer are greater if the cancer is diagnosed early. What are the risks of screening? The screening may show  lung cancer when no cancer is present. Talk with your health care provider about what your results mean. In some cases, your health care provider may do more testing to confirm the results. The screening may not find lung cancer when it is present. You will be exposed to radiation from repeated LDCT tests, which can cause cancer in otherwise healthy people. How can I lower my risk of lung cancer? Make these lifestyle changes to lower your risk of developing lung cancer: Do not use any products that contain nicotine or tobacco. These products include cigarettes, chewing tobacco, and vaping devices, such as e-cigarettes. If you need help quitting, ask your health care provider. Avoid secondhand smoke. Avoid exposure to radiation. Avoid exposure to radon gas. Have your home checked for radon regularly. Avoid things that cause cancer (carcinogens). Avoid living or working in places with high air pollution or diesel exhaust. Questions to ask your health care provider Am I eligible for lung cancer screening? Does my health insurance cover the cost of lung cancer screening? What happens if the lung cancer screening shows something of concern? How soon will I have results from my lung cancer screening? Is there anything that I need to do to prepare for my lung cancer screening? What happens if I decide not to have lung cancer screening? Where to find more information Ask your health care provider about the risks and benefits of screening. More information and resources are available from these organizations: American Cancer Society (ACS): cancer.org American Lung Association: lung.org National Cancer Institute: cancer.gov Contact a health care provider if: You start to show symptoms of lung cancer, including: A cough that will not go away. High-pitched whistling sounds when you breathe, most often when you breathe out (wheezing). Chest pain. Coughing up blood. Shortness of breath. Weight loss  that cannot be explained. Constant tiredness (fatigue). Hoarse voice. Summary Lung cancer screening may find lung cancer before symptoms appear. Finding cancer early improves the chances of successful treatment. It may save your life. The recommended screening test is a low-dose computed tomography (LDCT) scan that looks for abnormal cells in the lungs. If you are at risk for lung cancer, it is recommended that you get screened once a year. You can make lifestyle changes to lower your risk of lung cancer. Ask your health care provider about the risks and benefits of screening. This information is not intended to replace advice given to you by your health care provider. Make sure you discuss any questions you have with your health care provider. Document Revised: 01/30/2022 Document Reviewed: 07/13/2020 Elsevier Patient Education  2024 [employer].  Understanding Your Risk for Falls Millions of people have serious injuries from falls each year. It is important to understand your risk of falling. Talk with your health care provider about your risk and what you can do to lower  it. If you do have a serious fall, make sure to tell your provider. Falling once raises your risk of falling again. How can falls affect me? Serious injuries from falls are common. These include: Broken bones, such as hip fractures. Head injuries, such as traumatic brain injuries (TBI) or concussions. A fear of falling can cause you to avoid activities and stay at home. This can make your muscles weaker and raise your risk for a fall. What can increase my risk? There are a number of risk factors that increase your risk for falling. The more risk factors you have, the higher your risk of falling. Serious injuries from a fall happen most often to people who are older than 69 years old. Teenagers and young adults ages 6-29 are also at higher risk. Common risk factors include: Weakness in the lower body. Being generally  weak or confused due to long-term (chronic) illness. Dizziness or balance problems. Poor vision. Medicines that cause dizziness or drowsiness. These may include: Medicines for your blood pressure, heart, anxiety, insomnia, or swelling (edema). Pain medicines. Muscle relaxants. Other risk factors include: Drinking alcohol. Having had a fall in the past. Having foot pain or wearing improper footwear. Working at a dangerous job. Having any of the following in your home: Tripping hazards, such as floor clutter or loose rugs. Poor lighting. Pets. Having dementia or memory loss. What actions can I take to lower my risk of falling?     Physical activity Stay physically fit. Do strength and balance exercises. Consider taking a regular class to build strength and balance. Yoga and tai chi are good options. Vision Have your eyes checked every year and your prescription for glasses or contacts updated as needed. Shoes and walking aids Wear non-skid shoes. Wear shoes that have rubber soles and low heels. Do not wear high heels. Do not walk around the house in socks or slippers. Use a cane or walker as told by your provider. Home safety Attach secure railings on both sides of your stairs. Install grab bars for your bathtub, shower, and toilet. Use a non-skid mat in your bathtub or shower. Attach bath mats securely with double-sided, non-slip rug tape. Use good lighting in all rooms. Keep a flashlight near your bed. Make sure there is a clear path from your bed to the bathroom. Use night-lights. Do not use throw rugs. Make sure all carpeting is taped or tacked down securely. Remove all clutter from walkways and stairways, including extension cords. Repair uneven or broken steps and floors. Avoid walking on icy or slippery surfaces. Walk on the grass instead of on icy or slick sidewalks. Use ice melter to get rid of ice on walkways in the winter. Use a cordless phone. Questions to ask your  health care provider Can you help me check my risk for a fall? Do any of my medicines make me more likely to fall? Should I take a vitamin D supplement? What exercises can I do to improve my strength and balance? Should I make an appointment to have my vision checked? Do I need a bone density test to check for weak bones (osteoporosis)? Would it help to use a cane or a walker? Where to find more information Centers for Disease Control and Prevention, STEADI: TonerPromos.no Community-Based Fall Prevention Programs: TonerPromos.no [employer] on Aging: BaseRingTones.pl Contact a health care provider if: You fall at home. You are afraid of falling at home. You feel weak, drowsy, or dizzy. This information is not intended to  replace advice given to you by your health care provider. Make sure you discuss any questions you have with your health care provider. Document Revised: 09/25/2021 Document Reviewed: 09/25/2021 Elsevier Patient Education  2024 [employer].

## 2023-03-11 DIAGNOSIS — M1712 Unilateral primary osteoarthritis, left knee: Secondary | ICD-10-CM | POA: Diagnosis not present

## 2023-03-26 DIAGNOSIS — G4733 Obstructive sleep apnea (adult) (pediatric): Secondary | ICD-10-CM | POA: Diagnosis not present

## 2023-04-22 ENCOUNTER — Ambulatory Visit: Payer: Self-pay | Admitting: Family Medicine

## 2023-04-22 NOTE — Telephone Encounter (Signed)
 Patient has appointment on 319 we will discuss this further at that follow-up visit thank you

## 2023-04-22 NOTE — Telephone Encounter (Signed)
 Copied from CRM [id number]. Topic: Clinical - Red Word Triage >> Apr 22, 2023 10:35 AM Izetta Dakin wrote: [facility] that prompted transfer to Nurse Triage: severe pain in both wrist.  Chief Complaint: body/joint pain Symptoms: pain in all joints 7 or 8/10 Frequency: ongoing last 2-3 years & worsening last few months Pertinent Negatives: Patient denies fever Disposition: [] ED /[] Urgent Care (no appt availability in office) / [x] Appointment(In office/virtual)/ []  [facility]/ [] Home Care/ [] Refused Recommended Disposition /[]  Mobile Bus/ []  Follow-up with PCP Additional Notes: currently taking ibuprofen: pt would like different medications that is not bad for body to help with pain.  Moving, twisting, squeezing objects increasing pain. Main issues are bilateral wrists & thumbs but other joints are also effected.  Reason for Disposition  [1] MODERATE pain (e.g., interferes with normal activities) AND [2] present > 3 days  Answer Assessment - Initial Assessment Questions 1. ONSET: "When did the muscle aches or body pains start?"      Ongoing for several years: started as right wrist & wrist thumb and now left wrist and left thumb - now going in all joints  2. LOCATION: "What part of your body is hurting?" (e.g., entire body, arms, legs)      All joints have pain 3. SEVERITY: "How bad is the pain?" (Scale 1-10; or mild, moderate, severe)   - MILD (1-3): doesn't interfere with normal activities    - MODERATE (4-7): interferes with normal activities or awakens from sleep    - SEVERE (8-10):  excruciating pain, unable to do any normal activities      Moderate to severe 4. CAUSE: "What do you think is causing the pains?"     arthritis 5. FEVER: "Have you been having fever?"     no 6. OTHER SYMPTOMS: "Do you have any other symptoms?" (e.g., chest pain, weakness, rash, cold or flu symptoms, weight loss)     no 7. PREGNANCY: "Is there any chance you are pregnant?" "When was your  last menstrual period?"     N/a 8. TRAVEL: "Have you traveled out of the country in the last month?" (e.g., travel history, exposures)     N/a  Protocols used: Muscle Aches and Body Pain-A-AH

## 2023-04-24 ENCOUNTER — Ambulatory Visit (INDEPENDENT_AMBULATORY_CARE_PROVIDER_SITE_OTHER): Admitting: Family Medicine

## 2023-04-24 ENCOUNTER — Encounter: Payer: Self-pay | Admitting: Family Medicine

## 2023-04-24 VITALS — BP 134/82 | HR 73 | Temp 97.7°F | Ht 72.0 in | Wt 229.0 lb

## 2023-04-24 DIAGNOSIS — E785 Hyperlipidemia, unspecified: Secondary | ICD-10-CM | POA: Diagnosis not present

## 2023-04-24 DIAGNOSIS — M79641 Pain in right hand: Secondary | ICD-10-CM

## 2023-04-24 DIAGNOSIS — M25532 Pain in left wrist: Secondary | ICD-10-CM | POA: Diagnosis not present

## 2023-04-24 DIAGNOSIS — Z8739 Personal history of other diseases of the musculoskeletal system and connective tissue: Secondary | ICD-10-CM | POA: Diagnosis not present

## 2023-04-24 DIAGNOSIS — M79642 Pain in left hand: Secondary | ICD-10-CM

## 2023-04-24 DIAGNOSIS — Z79899 Other long term (current) drug therapy: Secondary | ICD-10-CM | POA: Diagnosis not present

## 2023-04-24 DIAGNOSIS — M25531 Pain in right wrist: Secondary | ICD-10-CM | POA: Diagnosis not present

## 2023-04-24 NOTE — Progress Notes (Signed)
   Subjective:    Patient ID: Jared Bentley, male    DOB: [birth year], 68 y.o.   MRN: [id number]  HPI Bilateral wrist pain discomfort in addition to this also feeling having some pain in the hands with gripping No numbness or tingling Has had previous bilateral carpal tunnel release Patient denies elbow pain but does have wrist pain thumb pain to some degree knee pain and even some back pain   Review of Systems     Objective:   Physical Exam  General-in no acute distress Eyes-no discharge Lungs-respiratory rate normal, CTA CV-no murmurs,RRR Extremities skin warm dry no edema Neuro grossly normal Behavior normal, alert  Decreased range of motion of the wrist noted with flexion and extension subjective discomfort in the wrist as well as the midportion of the hand and also the base of each thumb      Assessment & Plan:   Osteoarthritis We discussed prescription anti-inflammatory versus OTC patient would like to wean on utilizing medicine that has a low risk so therefore he will stick with ibuprofen OTC 200 mg 1 taken as needed every other day patient to follow-up if any progressive troubles or problems Patient will do lab work before his next visit Standard visit in April

## 2023-04-28 ENCOUNTER — Other Ambulatory Visit: Payer: Self-pay | Admitting: Family Medicine

## 2023-04-29 ENCOUNTER — Other Ambulatory Visit: Payer: Self-pay

## 2023-04-29 MED ORDER — PRAVASTATIN SODIUM 10 MG PO TABS: ORAL_TABLET | ORAL | 1 refills | Status: DC

## 2023-05-07 DIAGNOSIS — Z8739 Personal history of other diseases of the musculoskeletal system and connective tissue: Secondary | ICD-10-CM | POA: Diagnosis not present

## 2023-05-07 DIAGNOSIS — M79641 Pain in right hand: Secondary | ICD-10-CM | POA: Diagnosis not present

## 2023-05-07 DIAGNOSIS — M25531 Pain in right wrist: Secondary | ICD-10-CM | POA: Diagnosis not present

## 2023-05-07 DIAGNOSIS — M79642 Pain in left hand: Secondary | ICD-10-CM | POA: Diagnosis not present

## 2023-05-07 DIAGNOSIS — Z79899 Other long term (current) drug therapy: Secondary | ICD-10-CM | POA: Diagnosis not present

## 2023-05-07 DIAGNOSIS — M25532 Pain in left wrist: Secondary | ICD-10-CM | POA: Diagnosis not present

## 2023-05-08 ENCOUNTER — Encounter: Payer: Self-pay | Admitting: Family Medicine

## 2023-05-08 LAB — BASIC METABOLIC PANEL WITH GFR
BUN/Creatinine Ratio: 16 (ref 10–24)
BUN: 15 mg/dL (ref 8–27)
CO2: 26 mmol/L (ref 20–29)
Calcium: 10 mg/dL (ref 8.6–10.2)
Chloride: 101 mmol/L (ref 96–106)
Creatinine, Ser: 0.96 mg/dL (ref 0.76–1.27)
Glucose: 106 mg/dL — ABNORMAL HIGH (ref 70–99)
Potassium: 4.9 mmol/L (ref 3.5–5.2)
Sodium: 140 mmol/L (ref 134–144)
eGFR: 86 mL/min/{1.73_m2} (ref >59)

## 2023-05-08 LAB — URIC ACID: Uric Acid: 6.2 mg/dL (ref 3.8–8.4)

## 2023-05-08 LAB — SEDIMENTATION RATE: Sed Rate: 13 mm/h (ref 0–30)

## 2023-05-08 LAB — C-REACTIVE PROTEIN: CRP: 14 mg/L — ABNORMAL HIGH (ref 0–10)

## 2023-05-08 LAB — RHEUMATOID FACTOR: Rheumatoid fact SerPl-aCnc: <10 [IU]/mL (ref <14.0)

## 2023-05-14 ENCOUNTER — Encounter: Payer: Self-pay | Admitting: Physician Assistant

## 2023-05-14 ENCOUNTER — Ambulatory Visit: Admitting: Physician Assistant

## 2023-05-14 ENCOUNTER — Ambulatory Visit: Payer: Medicare HMO | Admitting: Family Medicine

## 2023-05-14 VITALS — BP 129/78 | HR 69 | Temp 97.9°F | Ht 73.0 in | Wt 230.6 lb

## 2023-05-14 DIAGNOSIS — M79609 Pain in unspecified limb: Secondary | ICD-10-CM | POA: Diagnosis not present

## 2023-05-14 DIAGNOSIS — E785 Hyperlipidemia, unspecified: Secondary | ICD-10-CM | POA: Diagnosis not present

## 2023-05-14 DIAGNOSIS — Z8739 Personal history of other diseases of the musculoskeletal system and connective tissue: Secondary | ICD-10-CM | POA: Diagnosis not present

## 2023-05-14 DIAGNOSIS — G8929 Other chronic pain: Secondary | ICD-10-CM

## 2023-05-14 DIAGNOSIS — M25562 Pain in left knee: Secondary | ICD-10-CM | POA: Diagnosis not present

## 2023-05-14 NOTE — Progress Notes (Signed)
   Established Patient Office Visit  Subjective   Patient ID: Jared Bentley, male    DOB: 1955-12-03  Age: 68 y.o. MRN: [id number]  No chief complaint on file.   Patient presents today for chronic medical follow up. He reports daily compliance with medication. He denies concerns or complaints today.      Review of Systems  Constitutional:  Negative for fever, malaise/fatigue and weight loss.  Respiratory:  Negative for cough and shortness of breath.   Cardiovascular:  Negative for chest pain and palpitations.  Gastrointestinal:  Negative for nausea and vomiting.  Musculoskeletal:  Positive for joint pain. Negative for myalgias.      Objective:     BP 129/78   Pulse 69   Temp 97.9 F (36.6 C)   Ht 6\' 1"  (1.854 m)   Wt 230 lb 9.6 oz (104.6 kg)   SpO2 98%   BMI 30.42 kg/m    Physical Exam Constitutional:      Appearance: Normal appearance.  HENT:     Head: Normocephalic.     Mouth/Throat:     Mouth: Mucous membranes are moist.     Pharynx: Oropharynx is clear.  Eyes:     Extraocular Movements: Extraocular movements intact.     Conjunctiva/sclera: Conjunctivae normal.  Cardiovascular:     Rate and Rhythm: Normal rate and regular rhythm.     Heart sounds: Normal heart sounds. No murmur heard. Pulmonary:     Effort: Pulmonary effort is normal.     Breath sounds: Normal breath sounds.  Musculoskeletal:     Right lower leg: No edema.     Left lower leg: No edema.  Skin:    General: Skin is warm and dry.  Neurological:     General: No focal deficit present.     Mental Status: He is alert and oriented to person, place, and time.  Psychiatric:        Mood and Affect: Mood normal.        Behavior: Behavior normal.     No results found for any visits on 05/14/23.  The 10-year ASCVD risk score (Arnett DK, et al., 2019) is: 12.6%    Assessment & Plan:   Return in about 6 months (around 11/13/2023).   Pain in soft tissues of limb Assessment & Plan: Stable  since last visit with Dr. Lorin Picket. Reports intermittent aches and soreness. Recent lab work reviewed and shared with the patient. Rheumatoid factor negative, sed rate normal, CRP minimally elevated.  He has no other concerns or new symptoms.     Chronic pain of left knee Assessment & Plan: Stable. Recent lab work reviewed and shared with patient. Continue current treatment plan.    Hyperlipidemia, unspecified hyperlipidemia type Assessment & Plan: Stable. Continue with current management without changes. Discussed healthy diet and lifestyle.    History of gout Assessment & Plan: Stable. Recent lab work reviewed. Continue current treatment plan.      Toni Amend Retta Pitcher, PA-C

## 2023-05-14 NOTE — Assessment & Plan Note (Signed)
 Stable. Recent lab work reviewed and shared with patient. Continue current treatment plan.

## 2023-05-14 NOTE — Assessment & Plan Note (Signed)
 Stable. Recent lab work reviewed. Continue current treatment plan.

## 2023-05-14 NOTE — Assessment & Plan Note (Addendum)
 Stable since last visit with Dr. Lorin Picket. Reports intermittent aches and soreness. Recent lab work reviewed and shared with the patient. Rheumatoid factor negative, sed rate normal, CRP minimally elevated.  He has no other concerns or new symptoms.

## 2023-05-14 NOTE — Assessment & Plan Note (Signed)
 Stable. Continue with current management without changes. Discussed healthy diet and lifestyle.

## 2023-06-24 DIAGNOSIS — G4733 Obstructive sleep apnea (adult) (pediatric): Secondary | ICD-10-CM | POA: Diagnosis not present

## 2023-07-07 ENCOUNTER — Other Ambulatory Visit: Payer: Self-pay | Admitting: Family Medicine

## 2023-07-24 ENCOUNTER — Other Ambulatory Visit: Payer: Self-pay

## 2023-07-24 ENCOUNTER — Telehealth: Payer: Self-pay

## 2023-07-24 DIAGNOSIS — M1811 Unilateral primary osteoarthritis of first carpometacarpal joint, right hand: Secondary | ICD-10-CM

## 2023-07-24 NOTE — Telephone Encounter (Signed)
 Pt needs referral to Rheumatology   [facility]  [address]  [address]  Phone number [phone] Fax [phone]

## 2023-07-24 NOTE — Telephone Encounter (Signed)
 May have referral to rheumatology But is important for nurses to talk with patient to try to detail the reasons for the referral so that the rheumatologist is more likely to see them and not declined the referral Please talk with patient and document Could be psoriatic arthritis thank you

## 2023-08-30 ENCOUNTER — Other Ambulatory Visit: Payer: Self-pay

## 2023-08-30 DIAGNOSIS — M1811 Unilateral primary osteoarthritis of first carpometacarpal joint, right hand: Secondary | ICD-10-CM

## 2023-09-17 ENCOUNTER — Other Ambulatory Visit: Payer: Self-pay

## 2023-09-17 DIAGNOSIS — G5600 Carpal tunnel syndrome, unspecified upper limb: Secondary | ICD-10-CM

## 2023-09-23 DIAGNOSIS — G4733 Obstructive sleep apnea (adult) (pediatric): Secondary | ICD-10-CM | POA: Diagnosis not present

## 2023-10-23 DIAGNOSIS — L400 Psoriasis vulgaris: Secondary | ICD-10-CM | POA: Diagnosis not present

## 2023-10-24 DIAGNOSIS — G5603 Carpal tunnel syndrome, bilateral upper limbs: Secondary | ICD-10-CM | POA: Diagnosis not present

## 2023-10-24 DIAGNOSIS — M180 Bilateral primary osteoarthritis of first carpometacarpal joints: Secondary | ICD-10-CM | POA: Diagnosis not present

## 2023-11-12 DIAGNOSIS — H52223 Regular astigmatism, bilateral: Secondary | ICD-10-CM | POA: Diagnosis not present

## 2023-11-12 DIAGNOSIS — H524 Presbyopia: Secondary | ICD-10-CM | POA: Diagnosis not present

## 2023-11-12 DIAGNOSIS — H35363 Drusen (degenerative) of macula, bilateral: Secondary | ICD-10-CM | POA: Diagnosis not present

## 2023-11-12 DIAGNOSIS — H5203 Hypermetropia, bilateral: Secondary | ICD-10-CM | POA: Diagnosis not present

## 2023-11-12 DIAGNOSIS — H2513 Age-related nuclear cataract, bilateral: Secondary | ICD-10-CM | POA: Diagnosis not present

## 2023-11-13 ENCOUNTER — Ambulatory Visit (INDEPENDENT_AMBULATORY_CARE_PROVIDER_SITE_OTHER): Admitting: Family Medicine

## 2023-11-13 VITALS — BP 105/66 | HR 68 | Temp 97.7°F | Ht 73.0 in | Wt 225.0 lb

## 2023-11-13 DIAGNOSIS — Z8739 Personal history of other diseases of the musculoskeletal system and connective tissue: Secondary | ICD-10-CM

## 2023-11-13 DIAGNOSIS — R739 Hyperglycemia, unspecified: Secondary | ICD-10-CM

## 2023-11-13 DIAGNOSIS — Z79899 Other long term (current) drug therapy: Secondary | ICD-10-CM

## 2023-11-13 DIAGNOSIS — Z125 Encounter for screening for malignant neoplasm of prostate: Secondary | ICD-10-CM

## 2023-11-13 DIAGNOSIS — E785 Hyperlipidemia, unspecified: Secondary | ICD-10-CM

## 2023-11-13 MED ORDER — PRAVASTATIN SODIUM 10 MG PO TABS: ORAL_TABLET | ORAL | 1 refills | Status: AC

## 2023-11-13 MED ORDER — ALLOPURINOL 100 MG PO TABS: 100.0000 mg | ORAL_TABLET | Freq: Every day | ORAL | 1 refills | Status: AC

## 2023-11-13 NOTE — Progress Notes (Signed)
   Subjective:    Patient ID: Jared Bentley, male    DOB: [birth year], 68 y.o.   MRN: [id number]  HPI 6 month follow up hyperlipidemia, hx of gout  Patient has not had any flareup of gout He is taking his medication Trying to be healthy with his diet  Not smoking Tolerating his medicines well Does need lab work   Review of Systems     Objective:   Physical Exam  General-in no acute distress Eyes-no discharge Lungs-respiratory rate normal, CTA CV-no murmurs,RRR Extremities skin warm dry no edema Neuro grossly normal Behavior normal, alert  Patient has significant arthritis at the wrist as well as thumb will be having thumb surgery coming up     Assessment & Plan:  1. History of gout Goal is to keep uric acid 6 or less healthy diet recommended continue medication - Uric acid  2. Hyperlipidemia, unspecified hyperlipidemia type (Primary) Try to keep LDL at least below 100 continue medicine healthy diet - Lipid Profile  3. High risk medication use Lab ordered - Hepatic function panel  4. Screening PSA (prostate specific antigen) Screening PSA ordered - PSA  5. Hyperglycemia Check kidney function results - Basic Metabolic Panel (BMET)

## 2023-11-19 DIAGNOSIS — M13131 Monoarthritis, not elsewhere classified, right wrist: Secondary | ICD-10-CM | POA: Diagnosis not present

## 2023-11-19 DIAGNOSIS — M19031 Primary osteoarthritis, right wrist: Secondary | ICD-10-CM | POA: Diagnosis not present

## 2023-11-26 ENCOUNTER — Emergency Department (HOSPITAL_COMMUNITY)
Admission: EM | Admit: 2023-11-26 | Discharge: 2023-11-26 | Disposition: A | Discharge status: Home / Self Care | Attending: Emergency Medicine | Admitting: Emergency Medicine

## 2023-11-26 ENCOUNTER — Other Ambulatory Visit: Payer: Self-pay

## 2023-11-26 ENCOUNTER — Ambulatory Visit: Payer: Self-pay

## 2023-11-26 ENCOUNTER — Encounter (HOSPITAL_COMMUNITY): Payer: Self-pay

## 2023-11-26 ENCOUNTER — Emergency Department (HOSPITAL_COMMUNITY)

## 2023-11-26 DIAGNOSIS — K6289 Other specified diseases of anus and rectum: Secondary | ICD-10-CM | POA: Diagnosis not present

## 2023-11-26 DIAGNOSIS — K5900 Constipation, unspecified: Secondary | ICD-10-CM | POA: Diagnosis not present

## 2023-11-26 DIAGNOSIS — K5641 Fecal impaction: Secondary | ICD-10-CM | POA: Diagnosis not present

## 2023-11-26 LAB — CBC WITH DIFFERENTIAL/PLATELET
Basophils Absolute: 0 K/uL (ref 0.0–0.1)
Basophils Relative: 0 %
Eosinophils Absolute: 0.1 K/uL (ref 0.0–0.5)
Eosinophils Relative: 1 %
HCT: 47.6 % (ref 39.0–52.0)
Hemoglobin: 15.4 g/dL (ref 13.0–17.0)
Lymphocytes Relative: 12 %
Lymphs Abs: 1.6 K/uL (ref 0.7–4.0)
MCH: 30 pg (ref 26.0–34.0)
MCHC: 32.4 g/dL (ref 30.0–36.0)
MCV: 92.8 fL (ref 80.0–100.0)
Monocytes Absolute: 0.4 K/uL (ref 0.1–1.0)
Monocytes Relative: 3 %
Neutro Abs: 11.3 K/uL — ABNORMAL HIGH (ref 1.7–7.7)
Neutrophils Relative %: 84 %
Platelets: 335 K/uL (ref 150–400)
RBC: 5.13 MIL/uL (ref 4.22–5.81)
RDW: 13.6 % (ref 11.5–15.5)
Smear Review: NORMAL
WBC: 13.4 K/uL — ABNORMAL HIGH (ref 4.0–10.5)
nRBC: 0 % (ref 0.0–0.2)

## 2023-11-26 LAB — BASIC METABOLIC PANEL WITH GFR
Anion gap: 14 (ref 5–15)
BUN: 21 mg/dL (ref 8–23)
CO2: 26 mmol/L (ref 22–32)
Calcium: 9.8 mg/dL (ref 8.9–10.3)
Chloride: 101 mmol/L (ref 98–111)
Creatinine, Ser: 0.99 mg/dL (ref 0.61–1.24)
GFR, Estimated: >60 mL/min (ref >60)
Glucose, Bld: 124 mg/dL — ABNORMAL HIGH (ref 70–99)
Potassium: 4.1 mmol/L (ref 3.5–5.1)
Sodium: 140 mmol/L (ref 135–145)

## 2023-11-26 NOTE — ED Triage Notes (Signed)
 Pt arrived via POV c/o constipation. Pt reports recently being prescribed opiates for a RUE injury. Pt reports trying suppository stool softener, but now Pt feels he may be impacted. Pt reports last hard stool was yesterday.

## 2023-11-26 NOTE — Discharge Instructions (Signed)
 Please drink plenty of fluids and increase your fiber intake.  MiraLAX.  You can continue the Colace as needed.  Follow-up with your primary care doctor.  Return to the emergency department if any worsening or concerning symptoms.

## 2023-11-26 NOTE — ED Notes (Addendum)
 Per pt, he had a large BM after enema was administered.

## 2023-11-26 NOTE — ED Provider Notes (Signed)
 [place] EMERGENCY DEPARTMENT AT [facility] Provider Note   CSN: [id number] Arrival date & time: 11/26/23  1442     Patient presents with: Fecal Impaction   Jared Bentley is a 68 y.o. male.  He is here with a complaint of constipation and rectal pain.  He had right wrist surgery a and was put on narcotics for pain.  He was taking a stool softener but was not really doing much.  He said he had a hard painful stool yesterday and today is unable to go.  Also really not able to urinate today.  No vomiting.  Complaining of significant rectal pain.  Tried a suppository without improvement.  {Add pertinent medical, surgical, social history, OB history to YEP:67052} The history is provided by the patient.  Constipation Severity:  Moderate Time since last bowel movement:  1 day Timing:  Constant Progression:  Unchanged Chronicity:  New Context: narcotics   Stool description:  Hard Relieved by:  Nothing Associated symptoms: urinary retention   Associated symptoms: no abdominal pain, no diarrhea, no fever and no hematochezia        Prior to Admission medications   Medication Sig Start Date End Date Taking? Authorizing Provider  allopurinol  (ZYLOPRIM ) 100 MG tablet Take 1 tablet (100 mg total) by mouth daily. 11/13/23   Alphonsa Glendia LABOR, MD  colchicine  0.6 MG tablet 2 pills now then 1 bid prn for gout flare up 11/07/21   Alphonsa Glendia LABOR, MD  hydrocortisone  2.5 % ointment Apply topically 2 (two) times daily. 05/10/22   Alphonsa Glendia LABOR, MD  ibuprofen (ADVIL) 200 MG tablet Take 200 mg by mouth every 6 (six) hours as needed.    [provider]  pravastatin  (PRAVACHOL ) 10 MG tablet TAKE 1 TABLET BY MOUTH ON MONDAY AND FRIDAY 11/13/23   Alphonsa Glendia LABOR, MD  sildenafil  (VIAGRA ) 50 MG tablet Take 1 tablet (50 mg total) by mouth daily as needed for erectile dysfunction. 05/10/22   Alphonsa Glendia LABOR, MD    Allergies: Patient has no known allergies.    Review of Systems   Constitutional:  Negative for fever.  Gastrointestinal:  Positive for constipation. Negative for abdominal pain, diarrhea and hematochezia.    Updated Vital Signs BP 127/81 (BP Location: Left Arm)   Pulse 96   Temp 98 F (36.7 C) (Oral)   Resp 16   Ht 6' 1 (1.854 m)   Wt 102.1 kg   SpO2 99%   BMI 29.69 kg/m   Physical Exam Vitals and nursing note reviewed.  Constitutional:      Appearance: Normal appearance. He is well-developed.  HENT:     Head: Normocephalic and atraumatic.  Eyes:     Conjunctiva/sclera: Conjunctivae normal.  Cardiovascular:     Rate and Rhythm: Normal rate and regular rhythm.     Heart sounds: No murmur heard. Pulmonary:     Effort: Pulmonary effort is normal. No respiratory distress.     Breath sounds: Normal breath sounds.  Abdominal:     Palpations: Abdomen is soft.     Tenderness: There is no abdominal tenderness. There is no guarding or rebound.  Musculoskeletal:     Cervical back: Neck supple.  Skin:    General: Skin is warm and dry.  Neurological:     General: No focal deficit present.     Mental Status: He is alert.     GCS: GCS eye subscore is 4. GCS verbal subscore is 5. GCS motor subscore  is 6.     (all labs ordered are listed, but only abnormal results are displayed) Labs Reviewed - No data to display  EKG: None  Radiology: No results found.  {Document cardiac monitor, telemetry assessment procedure when appropriate:32947} Procedures   Medications Ordered in the ED - No data to display    {Click here for ABCD2, HEART and other calculators REFRESH Note before signing:1}                              Medical Decision Making  This patient complains of ***; this involves an extensive number of treatment Options and is a complaint that carries with it a high risk of complications and morbidity. The differential includes ***  I ordered, reviewed and interpreted labs, which included *** I ordered medication *** and  reviewed PMP when indicated. I ordered imaging studies which included *** and I independently    visualized and interpreted imaging which showed *** Additional history obtained from *** Previous records obtained and reviewed *** I consulted *** and discussed lab and imaging findings and discussed disposition.  Cardiac monitoring reviewed, *** Social determinants considered, *** Critical Interventions: ***  After the interventions stated above, I reevaluated the patient and found *** Admission and further testing considered, ***   {Document critical care time when appropriate  Document review of labs and clinical decision tools ie CHADS2VASC2, etc  Document your independent review of radiology images and any outside records  Document your discussion with family members, caretakers and with consultants  Document social determinants of health affecting pt's care  Document your decision making why or why not admission, treatments were needed:32947:::1}   Final diagnoses:  None    ED Discharge Orders     None

## 2023-11-26 NOTE — Telephone Encounter (Signed)
Patient going to ER.

## 2023-11-26 NOTE — Telephone Encounter (Signed)
 FYI Only or Action Required?: FYI only for provider.  Patient was last seen in primary care on 11/13/2023 by Alphonsa Glendia LABOR, MD.  Called Nurse Triage reporting Constipation.  Symptoms began today.  Interventions attempted: OTC medications: suppository.  Symptoms are: gradually worsening.  Triage Disposition: See HCP Within 4 Hours (Or PCP Triage)  Patient/caregiver understands and will follow disposition?: Yes   Copied from CRM #[id number]. Topic: Clinical - Red Word Triage >> Nov 26, 2023  2:01 PM Roselie BROCKS wrote: Red Word that prompted transfer to Nurse Triage: Patient states he is very constipated , severe pain and pressure. Patient has tried everything to get relief and nothing is working Reason for Disposition  [1] Rectal pain or fullness from fecal impaction (rectum full of stool) AND [2] NOT better after SITZ bath, suppository or enema    Nurse judgement-advised ED now  Answer Assessment - Initial Assessment Questions Advised ED now. Patient reports will go to [facility] ED, wife will drive patient.   1. STOOL PATTERN OR FREQUENCY: How often do you have a bowel movement (BM)?  (Normal range: 3 times a day to every 3 days)  When was your last BM?       Last BM yesterday; small amount, hard stool.  Can't even urinate, last urinated 3 hours. Stopped opioid 2 days ago, taking stool softener 2. STRAINING: Do you have to strain to have a BM?      yes 3. ONSET: When did the constipation begin?     Today feel urge to go, unable pass stool; feels compacted, unable to pass gas 4. RECTAL PAIN: Does your rectum hurt when the stool comes out? If Yes, ask: Do you have hemorrhoids? How bad is the pain?  (Scale 1-10; or mild, moderate, severe)     no 5. BM COMPOSITION: Are the stools hard?      hare 6. BLOOD ON STOOLS: Has there been any blood on the toilet tissue or on the surface of the BM? If Yes, ask: When was the last time?     no 7. CHRONIC CONSTIPATION: Is  this a new problem for you?  If No, ask: How long have you had this problem? (days, weeks, months)      no 8. CHANGES IN DIET OR HYDRATION: Have there been any recent changes in your diet? How much fluids are you drinking on a daily basis?  How much have you had to drink today?     Able to eat and drink. 9. MEDICINES: Have you been taking any new medicines? Are you taking any narcotic pain medicines? (e.g., Dilaudid , morphine, Percocet, Vicodin)     opioid 10. LAXATIVES: Have you been using any stool softeners, laxatives, or enemas?  If Yes, ask What are you using, how often, and when was the last time?       Suppository today, no BM; used 1 hour ago  12. CAUSE: What do you think is causing the constipation?        opioid 13. MEDICAL HISTORY: Do you have a history of hemorrhoids, rectal fissures, rectal surgery, or rectal abscess?         no 14. OTHER SYMPTOMS: Do you have any other symptoms? (e.g., abdomen pain, bloating, fever, vomiting)       Denies abd pain, bloating, n/v, fever, chills, pain in bladder Abdomen feels hard, intermittent dizziness  Protocols used: Constipation-A-AH

## 2023-12-04 DIAGNOSIS — Z4789 Encounter for other orthopedic aftercare: Secondary | ICD-10-CM | POA: Diagnosis not present

## 2023-12-18 DIAGNOSIS — M25641 Stiffness of right hand, not elsewhere classified: Secondary | ICD-10-CM | POA: Diagnosis not present

## 2023-12-18 DIAGNOSIS — Z4789 Encounter for other orthopedic aftercare: Secondary | ICD-10-CM | POA: Diagnosis not present

## 2023-12-30 DIAGNOSIS — M25641 Stiffness of right hand, not elsewhere classified: Secondary | ICD-10-CM | POA: Diagnosis not present

## 2024-01-07 DIAGNOSIS — M1712 Unilateral primary osteoarthritis, left knee: Secondary | ICD-10-CM | POA: Diagnosis not present

## 2024-01-07 DIAGNOSIS — R739 Hyperglycemia, unspecified: Secondary | ICD-10-CM | POA: Diagnosis not present

## 2024-01-07 DIAGNOSIS — E785 Hyperlipidemia, unspecified: Secondary | ICD-10-CM | POA: Diagnosis not present

## 2024-01-07 DIAGNOSIS — Z79899 Other long term (current) drug therapy: Secondary | ICD-10-CM | POA: Diagnosis not present

## 2024-01-07 DIAGNOSIS — Z8739 Personal history of other diseases of the musculoskeletal system and connective tissue: Secondary | ICD-10-CM | POA: Diagnosis not present

## 2024-01-07 DIAGNOSIS — Z125 Encounter for screening for malignant neoplasm of prostate: Secondary | ICD-10-CM | POA: Diagnosis not present

## 2024-01-08 ENCOUNTER — Ambulatory Visit: Payer: Self-pay | Admitting: Family Medicine

## 2024-01-08 DIAGNOSIS — Z4789 Encounter for other orthopedic aftercare: Secondary | ICD-10-CM | POA: Diagnosis not present

## 2024-01-08 LAB — LIPID PANEL
Chol/HDL Ratio: 2.6 ratio (ref 0.0–5.0)
Cholesterol, Total: 176 mg/dL (ref 100–199)
HDL: 67 mg/dL (ref >39)
LDL Chol Calc (NIH): 99 mg/dL (ref 0–99)
Triglycerides: 49 mg/dL (ref 0–149)
VLDL Cholesterol Cal: 10 mg/dL (ref 5–40)

## 2024-01-08 LAB — BASIC METABOLIC PANEL WITH GFR
BUN/Creatinine Ratio: 21 (ref 10–24)
BUN: 20 mg/dL (ref 8–27)
CO2: 25 mmol/L (ref 20–29)
Calcium: 10.1 mg/dL (ref 8.6–10.2)
Chloride: 99 mmol/L (ref 96–106)
Creatinine, Ser: 0.97 mg/dL (ref 0.76–1.27)
Glucose: 104 mg/dL — ABNORMAL HIGH (ref 70–99)
Potassium: 4.9 mmol/L (ref 3.5–5.2)
Sodium: 140 mmol/L (ref 134–144)
eGFR: 85 mL/min/1.73 (ref >59)

## 2024-01-08 LAB — HEPATIC FUNCTION PANEL
ALT: 15 IU/L (ref 0–44)
AST: 17 IU/L (ref 0–40)
Albumin: 4.5 g/dL (ref 3.9–4.9)
Alkaline Phosphatase: 89 IU/L (ref 47–123)
Bilirubin Total: 0.5 mg/dL (ref 0.0–1.2)
Bilirubin, Direct: 0.16 mg/dL (ref 0.00–0.40)
Total Protein: 7.4 g/dL (ref 6.0–8.5)

## 2024-01-08 LAB — URIC ACID: Uric Acid: 6.1 mg/dL (ref 3.8–8.4)

## 2024-01-08 LAB — PSA: Prostate Specific Ag, Serum: 1 ng/mL (ref 0.0–4.0)

## 2024-01-14 DIAGNOSIS — M25641 Stiffness of right hand, not elsewhere classified: Secondary | ICD-10-CM | POA: Diagnosis not present

## 2024-02-28 ENCOUNTER — Ambulatory Visit: Payer: Medicare HMO

## 2024-05-13 ENCOUNTER — Ambulatory Visit: Admitting: Family Medicine
